# Patient Record
Sex: Male | Born: 1955 | Race: White | Hispanic: No | Smoking: Current every day smoker
Health system: Southern US, Community
[De-identification: ages and names within clinical notes are randomized; demographics above are authoritative.]

## PROBLEM LIST (undated history)

## (undated) DIAGNOSIS — G629 Polyneuropathy, unspecified: Secondary | ICD-10-CM

## (undated) DIAGNOSIS — Z720 Tobacco use: Secondary | ICD-10-CM

## (undated) DIAGNOSIS — N183 Chronic kidney disease, stage 3 unspecified: Secondary | ICD-10-CM

## (undated) DIAGNOSIS — I2510 Atherosclerotic heart disease of native coronary artery without angina pectoris: Secondary | ICD-10-CM

## (undated) DIAGNOSIS — I255 Ischemic cardiomyopathy: Secondary | ICD-10-CM

## (undated) DIAGNOSIS — J449 Chronic obstructive pulmonary disease, unspecified: Secondary | ICD-10-CM

## (undated) DIAGNOSIS — M359 Systemic involvement of connective tissue, unspecified: Secondary | ICD-10-CM

## (undated) DIAGNOSIS — I447 Left bundle-branch block, unspecified: Secondary | ICD-10-CM

## (undated) DIAGNOSIS — D751 Secondary polycythemia: Secondary | ICD-10-CM

## (undated) DIAGNOSIS — I5022 Chronic systolic (congestive) heart failure: Secondary | ICD-10-CM

## (undated) DIAGNOSIS — I998 Other disorder of circulatory system: Secondary | ICD-10-CM

## (undated) DIAGNOSIS — E119 Type 2 diabetes mellitus without complications: Secondary | ICD-10-CM

## (undated) DIAGNOSIS — I10 Essential (primary) hypertension: Secondary | ICD-10-CM

## (undated) DIAGNOSIS — E785 Hyperlipidemia, unspecified: Secondary | ICD-10-CM

## (undated) HISTORY — DX: Secondary polycythemia: D75.1

## (undated) HISTORY — DX: Chronic kidney disease, stage 3 unspecified: N18.30

## (undated) HISTORY — DX: Chronic kidney disease, stage 3 (moderate): N18.3

## (undated) HISTORY — DX: Chronic systolic (congestive) heart failure: I50.22

## (undated) HISTORY — DX: Atherosclerotic heart disease of native coronary artery without angina pectoris: I25.10

## (undated) HISTORY — DX: Ischemic cardiomyopathy: I25.5

## (undated) HISTORY — DX: Chronic obstructive pulmonary disease, unspecified: J44.9

## (undated) HISTORY — DX: Polyneuropathy, unspecified: G62.9

## (undated) HISTORY — DX: Tobacco use: Z72.0

## (undated) HISTORY — DX: Left bundle-branch block, unspecified: I44.7

## (undated) HISTORY — DX: Other disorder of circulatory system: I99.8

## (undated) HISTORY — DX: Hyperlipidemia, unspecified: E78.5

## (undated) HISTORY — DX: Essential (primary) hypertension: I10

---

## 2004-07-27 ENCOUNTER — Other Ambulatory Visit: Payer: Self-pay

## 2004-07-27 ENCOUNTER — Inpatient Hospital Stay: Payer: Self-pay | Admitting: Anesthesiology

## 2006-05-26 ENCOUNTER — Other Ambulatory Visit: Payer: Self-pay

## 2006-05-26 ENCOUNTER — Inpatient Hospital Stay: Payer: Self-pay | Admitting: Internal Medicine

## 2006-05-28 ENCOUNTER — Other Ambulatory Visit: Payer: Self-pay

## 2006-08-05 HISTORY — PX: ORIF TIBIA FRACTURE: SHX5416

## 2007-03-10 ENCOUNTER — Emergency Department: Payer: Self-pay | Admitting: Emergency Medicine

## 2007-03-13 ENCOUNTER — Other Ambulatory Visit: Payer: Self-pay

## 2007-03-13 ENCOUNTER — Inpatient Hospital Stay: Payer: Self-pay | Admitting: Unknown Physician Specialty

## 2008-12-26 ENCOUNTER — Ambulatory Visit: Payer: Self-pay | Admitting: Family Medicine

## 2010-12-04 ENCOUNTER — Emergency Department: Payer: Self-pay | Admitting: Emergency Medicine

## 2011-04-06 ENCOUNTER — Ambulatory Visit: Payer: Self-pay | Admitting: Internal Medicine

## 2011-04-23 ENCOUNTER — Ambulatory Visit: Payer: Self-pay | Admitting: Internal Medicine

## 2011-05-06 ENCOUNTER — Ambulatory Visit: Payer: Self-pay | Admitting: Internal Medicine

## 2011-06-06 ENCOUNTER — Ambulatory Visit: Payer: Self-pay | Admitting: Internal Medicine

## 2011-07-06 ENCOUNTER — Ambulatory Visit: Payer: Self-pay | Admitting: Internal Medicine

## 2011-08-13 ENCOUNTER — Ambulatory Visit: Payer: Self-pay | Admitting: Internal Medicine

## 2011-08-13 LAB — CBC CANCER CENTER
Basophil %: 2.1 %
Eosinophil %: 5.9 %
HCT: 47.3 % (ref 40.0–52.0)
HGB: 15.8 g/dL (ref 13.0–18.0)
Lymphocyte #: 3.3 x10 3/mm (ref 1.0–3.6)
MCV: 85 fL (ref 80–100)
Monocyte #: 0.9 x10 3/mm — ABNORMAL HIGH (ref 0.0–0.7)
Monocyte %: 8.1 %
Neutrophil #: 5.6 x10 3/mm (ref 1.4–6.5)
Platelet: 207 x10 3/mm (ref 150–440)
RBC: 5.57 10*6/uL (ref 4.40–5.90)
WBC: 10.6 x10 3/mm (ref 3.8–10.6)

## 2011-09-06 ENCOUNTER — Ambulatory Visit: Payer: Self-pay | Admitting: Internal Medicine

## 2011-10-04 ENCOUNTER — Ambulatory Visit: Payer: Self-pay | Admitting: Internal Medicine

## 2011-10-06 ENCOUNTER — Emergency Department: Payer: Self-pay | Admitting: Emergency Medicine

## 2011-11-05 ENCOUNTER — Ambulatory Visit: Payer: Self-pay | Admitting: Internal Medicine

## 2011-11-19 ENCOUNTER — Ambulatory Visit: Payer: Self-pay | Admitting: Pain Medicine

## 2011-11-27 ENCOUNTER — Ambulatory Visit: Payer: Self-pay | Admitting: Pain Medicine

## 2011-12-04 ENCOUNTER — Ambulatory Visit: Payer: Self-pay | Admitting: Internal Medicine

## 2012-01-04 ENCOUNTER — Ambulatory Visit: Payer: Self-pay | Admitting: Internal Medicine

## 2012-01-09 ENCOUNTER — Ambulatory Visit: Payer: Self-pay | Admitting: Pain Medicine

## 2012-01-22 ENCOUNTER — Ambulatory Visit: Payer: Self-pay | Admitting: Pain Medicine

## 2012-02-05 ENCOUNTER — Ambulatory Visit: Payer: Self-pay | Admitting: Internal Medicine

## 2012-02-05 LAB — CBC CANCER CENTER
Basophil #: 0.1 x10 3/mm (ref 0.0–0.1)
Eosinophil #: 0.3 x10 3/mm (ref 0.0–0.7)
Eosinophil %: 2.9 %
Lymphocyte %: 26.1 %
MCV: 83 fL (ref 80–100)
Monocyte %: 6.6 %
Neutrophil #: 7.6 x10 3/mm — ABNORMAL HIGH (ref 1.4–6.5)
Neutrophil %: 63.3 %
Platelet: 235 x10 3/mm (ref 150–440)
RBC: 6.08 10*6/uL — ABNORMAL HIGH (ref 4.40–5.90)
RDW: 15.1 % — ABNORMAL HIGH (ref 11.5–14.5)
WBC: 12 x10 3/mm — ABNORMAL HIGH (ref 3.8–10.6)

## 2012-02-27 ENCOUNTER — Ambulatory Visit: Payer: Self-pay | Admitting: Pain Medicine

## 2012-03-05 ENCOUNTER — Ambulatory Visit: Payer: Self-pay | Admitting: Internal Medicine

## 2012-03-09 ENCOUNTER — Ambulatory Visit: Payer: Self-pay | Admitting: Pain Medicine

## 2012-03-26 ENCOUNTER — Ambulatory Visit: Payer: Self-pay | Admitting: Pain Medicine

## 2012-04-01 ENCOUNTER — Ambulatory Visit: Payer: Self-pay | Admitting: Internal Medicine

## 2012-04-01 LAB — CANCER CENTER HEMATOCRIT: HCT: 47.9 % (ref 40.0–52.0)

## 2012-04-07 ENCOUNTER — Ambulatory Visit: Payer: Self-pay | Admitting: Pain Medicine

## 2012-04-08 ENCOUNTER — Ambulatory Visit: Payer: Self-pay | Admitting: Pain Medicine

## 2012-04-09 ENCOUNTER — Ambulatory Visit: Payer: Self-pay | Admitting: Internal Medicine

## 2012-04-23 ENCOUNTER — Ambulatory Visit: Payer: Self-pay | Admitting: Pain Medicine

## 2012-05-11 ENCOUNTER — Ambulatory Visit: Payer: Self-pay | Admitting: Pain Medicine

## 2012-06-10 ENCOUNTER — Ambulatory Visit: Payer: Self-pay | Admitting: Internal Medicine

## 2012-06-16 ENCOUNTER — Ambulatory Visit: Payer: Self-pay | Admitting: Pain Medicine

## 2012-06-24 ENCOUNTER — Ambulatory Visit: Payer: Self-pay | Admitting: Pain Medicine

## 2012-07-05 ENCOUNTER — Ambulatory Visit: Payer: Self-pay | Admitting: Internal Medicine

## 2012-07-16 ENCOUNTER — Ambulatory Visit: Payer: Self-pay | Admitting: Pain Medicine

## 2012-07-22 LAB — CBC CANCER CENTER
Basophil %: 1.1 %
Eosinophil #: 0.5 x10 3/mm (ref 0.0–0.7)
Eosinophil %: 5.6 %
HGB: 15.6 g/dL (ref 13.0–18.0)
Lymphocyte %: 35.5 %
MCHC: 33.6 g/dL (ref 32.0–36.0)
Monocyte %: 8.5 %
Neutrophil %: 49.3 %
Platelet: 177 x10 3/mm (ref 150–440)
RBC: 5.65 10*6/uL (ref 4.40–5.90)
WBC: 9.7 x10 3/mm (ref 3.8–10.6)

## 2012-07-27 ENCOUNTER — Ambulatory Visit: Payer: Self-pay | Admitting: Pain Medicine

## 2012-08-05 ENCOUNTER — Ambulatory Visit: Payer: Self-pay | Admitting: Internal Medicine

## 2012-08-18 ENCOUNTER — Ambulatory Visit: Payer: Self-pay | Admitting: Pain Medicine

## 2012-09-07 ENCOUNTER — Ambulatory Visit: Payer: Self-pay | Admitting: Pain Medicine

## 2012-10-03 ENCOUNTER — Ambulatory Visit: Payer: Self-pay | Admitting: Internal Medicine

## 2012-10-15 ENCOUNTER — Ambulatory Visit: Payer: Self-pay | Admitting: Pain Medicine

## 2012-11-03 ENCOUNTER — Ambulatory Visit: Payer: Self-pay | Admitting: Internal Medicine

## 2012-11-06 LAB — CANCER CENTER HEMATOCRIT: HCT: 49.2 % (ref 40.0–52.0)

## 2012-11-12 ENCOUNTER — Ambulatory Visit: Payer: Self-pay | Admitting: Pain Medicine

## 2012-11-23 ENCOUNTER — Ambulatory Visit: Payer: Self-pay | Admitting: Pain Medicine

## 2012-12-03 ENCOUNTER — Ambulatory Visit: Payer: Self-pay | Admitting: Internal Medicine

## 2012-12-17 ENCOUNTER — Ambulatory Visit: Payer: Self-pay | Admitting: Pain Medicine

## 2012-12-30 ENCOUNTER — Ambulatory Visit: Payer: Self-pay | Admitting: Pain Medicine

## 2013-01-03 ENCOUNTER — Ambulatory Visit: Payer: Self-pay | Admitting: Internal Medicine

## 2013-01-14 ENCOUNTER — Ambulatory Visit: Payer: Self-pay | Admitting: Pain Medicine

## 2013-02-02 ENCOUNTER — Ambulatory Visit: Payer: Self-pay | Admitting: Internal Medicine

## 2013-02-15 ENCOUNTER — Ambulatory Visit: Payer: Self-pay | Admitting: Pain Medicine

## 2013-03-01 ENCOUNTER — Ambulatory Visit: Payer: Self-pay | Admitting: Pain Medicine

## 2013-03-16 ENCOUNTER — Ambulatory Visit: Payer: Self-pay | Admitting: Pain Medicine

## 2013-03-30 ENCOUNTER — Ambulatory Visit: Payer: Self-pay | Admitting: Internal Medicine

## 2013-03-31 LAB — CANCER CENTER HEMATOCRIT: HCT: 48.5 % (ref 40.0–52.0)

## 2013-04-05 ENCOUNTER — Ambulatory Visit: Payer: Self-pay | Admitting: Internal Medicine

## 2013-04-15 ENCOUNTER — Ambulatory Visit: Payer: Self-pay | Admitting: Pain Medicine

## 2013-05-11 ENCOUNTER — Ambulatory Visit: Payer: Self-pay | Admitting: Pain Medicine

## 2013-05-31 ENCOUNTER — Ambulatory Visit: Payer: Self-pay | Admitting: Pain Medicine

## 2013-06-15 ENCOUNTER — Ambulatory Visit: Payer: Self-pay | Admitting: Pain Medicine

## 2013-06-23 ENCOUNTER — Ambulatory Visit: Payer: Self-pay | Admitting: Internal Medicine

## 2013-06-23 LAB — CBC CANCER CENTER
Basophil #: 0.1 x10 3/mm (ref 0.0–0.1)
Basophil %: 1.2 %
Eosinophil %: 5.1 %
Lymphocyte #: 2.3 x10 3/mm (ref 1.0–3.6)
Lymphocyte %: 27.8 %
MCHC: 32.2 g/dL (ref 32.0–36.0)
MCV: 85 fL (ref 80–100)
Monocyte #: 0.5 x10 3/mm (ref 0.2–1.0)
Monocyte %: 5.7 %
Neutrophil #: 5 x10 3/mm (ref 1.4–6.5)
Neutrophil %: 60.2 %
Platelet: 171 x10 3/mm (ref 150–440)

## 2013-06-28 ENCOUNTER — Ambulatory Visit: Payer: Self-pay | Admitting: Pain Medicine

## 2013-07-05 ENCOUNTER — Ambulatory Visit: Payer: Self-pay | Admitting: Internal Medicine

## 2013-07-15 ENCOUNTER — Ambulatory Visit: Payer: Self-pay | Admitting: Pain Medicine

## 2013-08-12 ENCOUNTER — Ambulatory Visit: Payer: Self-pay | Admitting: Pain Medicine

## 2013-08-26 DIAGNOSIS — F1190 Opioid use, unspecified, uncomplicated: Secondary | ICD-10-CM

## 2013-08-26 DIAGNOSIS — G8929 Other chronic pain: Secondary | ICD-10-CM

## 2013-08-26 DIAGNOSIS — F1110 Opioid abuse, uncomplicated: Secondary | ICD-10-CM

## 2013-08-26 DIAGNOSIS — E1140 Type 2 diabetes mellitus with diabetic neuropathy, unspecified: Secondary | ICD-10-CM | POA: Insufficient documentation

## 2013-08-26 HISTORY — DX: Type 2 diabetes mellitus with diabetic neuropathy, unspecified: E11.40

## 2013-08-26 HISTORY — DX: Other chronic pain: G89.29

## 2013-08-26 HISTORY — DX: Opioid use, unspecified, uncomplicated: F11.90

## 2013-08-26 HISTORY — DX: Opioid abuse, uncomplicated: F11.10

## 2013-08-30 ENCOUNTER — Ambulatory Visit: Payer: Self-pay | Admitting: Pain Medicine

## 2013-09-04 ENCOUNTER — Emergency Department: Payer: Self-pay | Admitting: Emergency Medicine

## 2013-09-04 LAB — URINALYSIS, COMPLETE
Bacteria: NONE SEEN
Bilirubin,UR: NEGATIVE
Blood: NEGATIVE
Glucose,UR: 150 mg/dL (ref 0–75)
KETONE: NEGATIVE
Leukocyte Esterase: NEGATIVE
NITRITE: NEGATIVE
PH: 5 (ref 4.5–8.0)
Protein: NEGATIVE
Specific Gravity: 1.008 (ref 1.003–1.030)

## 2013-09-13 ENCOUNTER — Ambulatory Visit: Payer: Self-pay | Admitting: Pain Medicine

## 2013-09-29 ENCOUNTER — Ambulatory Visit: Payer: Self-pay | Admitting: Pain Medicine

## 2013-10-12 ENCOUNTER — Ambulatory Visit: Payer: Self-pay | Admitting: Pain Medicine

## 2013-10-19 LAB — CBC
HCT: 50.9 % (ref 40.0–52.0)
HGB: 16.4 g/dL (ref 13.0–18.0)
MCH: 26.8 pg (ref 26.0–34.0)
MCHC: 32.3 g/dL (ref 32.0–36.0)
MCV: 83 fL (ref 80–100)
Platelet: 166 10*3/uL (ref 150–440)
RBC: 6.13 10*6/uL — ABNORMAL HIGH (ref 4.40–5.90)
RDW: 15.5 % — AB (ref 11.5–14.5)
WBC: 10.7 10*3/uL — ABNORMAL HIGH (ref 3.8–10.6)

## 2013-10-19 LAB — COMPREHENSIVE METABOLIC PANEL
ALBUMIN: 3.5 g/dL (ref 3.4–5.0)
AST: 20 U/L (ref 15–37)
Alkaline Phosphatase: 108 U/L
Anion Gap: 4 — ABNORMAL LOW (ref 7–16)
BILIRUBIN TOTAL: 0.3 mg/dL (ref 0.2–1.0)
BUN: 13 mg/dL (ref 7–18)
CALCIUM: 8.9 mg/dL (ref 8.5–10.1)
CHLORIDE: 104 mmol/L (ref 98–107)
CO2: 29 mmol/L (ref 21–32)
Creatinine: 1.5 mg/dL — ABNORMAL HIGH (ref 0.60–1.30)
EGFR (African American): 59 — ABNORMAL LOW
GFR CALC NON AF AMER: 51 — AB
GLUCOSE: 116 mg/dL — AB (ref 65–99)
Osmolality: 275 (ref 275–301)
POTASSIUM: 4.2 mmol/L (ref 3.5–5.1)
SGPT (ALT): 24 U/L (ref 12–78)
Sodium: 137 mmol/L (ref 136–145)
TOTAL PROTEIN: 7.8 g/dL (ref 6.4–8.2)

## 2013-10-20 ENCOUNTER — Inpatient Hospital Stay: Payer: Self-pay | Admitting: Internal Medicine

## 2013-10-20 ENCOUNTER — Ambulatory Visit: Payer: Self-pay | Admitting: Internal Medicine

## 2013-10-20 LAB — APTT
ACTIVATED PTT: 34.3 s (ref 23.6–35.9)
ACTIVATED PTT: 85.4 s — AB (ref 23.6–35.9)

## 2013-10-21 LAB — APTT
ACTIVATED PTT: 77.2 s — AB (ref 23.6–35.9)
Activated PTT: 128.5 secs — ABNORMAL HIGH (ref 23.6–35.9)
Activated PTT: 131.3 secs — ABNORMAL HIGH (ref 23.6–35.9)

## 2013-10-21 LAB — CBC WITH DIFFERENTIAL/PLATELET
Basophil #: 0.1 10*3/uL (ref 0.0–0.1)
Basophil %: 0.8 %
EOS PCT: 4.6 %
Eosinophil #: 0.5 10*3/uL (ref 0.0–0.7)
HCT: 50.6 % (ref 40.0–52.0)
HGB: 15.9 g/dL (ref 13.0–18.0)
LYMPHS ABS: 3.2 10*3/uL (ref 1.0–3.6)
Lymphocyte %: 32.4 %
MCH: 26.4 pg (ref 26.0–34.0)
MCHC: 31.5 g/dL — AB (ref 32.0–36.0)
MCV: 84 fL (ref 80–100)
MONOS PCT: 5.6 %
Monocyte #: 0.6 x10 3/mm (ref 0.2–1.0)
NEUTROS ABS: 5.6 10*3/uL (ref 1.4–6.5)
Neutrophil %: 56.6 %
Platelet: 160 10*3/uL (ref 150–440)
RBC: 6.02 10*6/uL — AB (ref 4.40–5.90)
RDW: 15.4 % — ABNORMAL HIGH (ref 11.5–14.5)
WBC: 9.8 10*3/uL (ref 3.8–10.6)

## 2013-10-21 LAB — BASIC METABOLIC PANEL
Anion Gap: 2 — ABNORMAL LOW (ref 7–16)
BUN: 16 mg/dL (ref 7–18)
CHLORIDE: 100 mmol/L (ref 98–107)
Calcium, Total: 9.1 mg/dL (ref 8.5–10.1)
Co2: 34 mmol/L — ABNORMAL HIGH (ref 21–32)
Creatinine: 1.42 mg/dL — ABNORMAL HIGH (ref 0.60–1.30)
EGFR (African American): >60
EGFR (Non-African Amer.): 55 — ABNORMAL LOW
GLUCOSE: 103 mg/dL — AB (ref 65–99)
Osmolality: 273 (ref 275–301)
POTASSIUM: 4.2 mmol/L (ref 3.5–5.1)
SODIUM: 136 mmol/L (ref 136–145)

## 2013-10-22 LAB — APTT: ACTIVATED PTT: 71.1 s — AB (ref 23.6–35.9)

## 2013-10-25 LAB — PROT IMMUNOELECTROPHORES(ARMC)

## 2013-10-26 ENCOUNTER — Ambulatory Visit: Payer: Self-pay | Admitting: Internal Medicine

## 2013-10-26 LAB — CBC CANCER CENTER
Basophil #: 0.1 x10 3/mm (ref 0.0–0.1)
Basophil %: 1 %
Eosinophil #: 0.4 x10 3/mm (ref 0.0–0.7)
Eosinophil %: 4.5 %
HCT: 46.8 % (ref 40.0–52.0)
HGB: 14.7 g/dL (ref 13.0–18.0)
Lymphocyte #: 2.9 x10 3/mm (ref 1.0–3.6)
Lymphocyte %: 29.5 %
MCH: 26.3 pg (ref 26.0–34.0)
MCHC: 31.5 g/dL — ABNORMAL LOW (ref 32.0–36.0)
MCV: 84 fL (ref 80–100)
Monocyte #: 1 x10 3/mm (ref 0.2–1.0)
Monocyte %: 9.9 %
Neutrophil #: 5.4 x10 3/mm (ref 1.4–6.5)
Neutrophil %: 55.1 %
Platelet: 187 x10 3/mm (ref 150–440)
RBC: 5.6 10*6/uL (ref 4.40–5.90)
RDW: 15.3 % — ABNORMAL HIGH (ref 11.5–14.5)
WBC: 9.8 x10 3/mm (ref 3.8–10.6)

## 2013-11-02 LAB — CANCER CENTER HEMATOCRIT: HCT: 46.7 % (ref 40.0–52.0)

## 2013-11-03 ENCOUNTER — Ambulatory Visit: Payer: Self-pay | Admitting: Internal Medicine

## 2013-11-10 LAB — CANCER CENTER HEMATOCRIT: HCT: 44 % (ref 40.0–52.0)

## 2013-11-11 ENCOUNTER — Ambulatory Visit: Payer: Self-pay | Admitting: Pain Medicine

## 2013-11-16 ENCOUNTER — Encounter (INDEPENDENT_AMBULATORY_CARE_PROVIDER_SITE_OTHER): Payer: Self-pay

## 2013-11-16 ENCOUNTER — Ambulatory Visit (INDEPENDENT_AMBULATORY_CARE_PROVIDER_SITE_OTHER): Payer: Medicare PPO | Admitting: Cardiovascular Disease

## 2013-11-16 ENCOUNTER — Encounter: Payer: Self-pay | Admitting: Cardiovascular Disease

## 2013-11-16 VITALS — BP 100/80 | HR 75 | Ht 72.0 in | Wt 305.5 lb

## 2013-11-16 DIAGNOSIS — F17200 Nicotine dependence, unspecified, uncomplicated: Secondary | ICD-10-CM

## 2013-11-16 DIAGNOSIS — I428 Other cardiomyopathies: Secondary | ICD-10-CM

## 2013-11-16 DIAGNOSIS — I998 Other disorder of circulatory system: Secondary | ICD-10-CM

## 2013-11-16 DIAGNOSIS — E119 Type 2 diabetes mellitus without complications: Secondary | ICD-10-CM

## 2013-11-16 DIAGNOSIS — R0602 Shortness of breath: Secondary | ICD-10-CM

## 2013-11-16 DIAGNOSIS — I999 Unspecified disorder of circulatory system: Secondary | ICD-10-CM

## 2013-11-16 DIAGNOSIS — I447 Left bundle-branch block, unspecified: Secondary | ICD-10-CM

## 2013-11-16 DIAGNOSIS — E7849 Other hyperlipidemia: Secondary | ICD-10-CM | POA: Insufficient documentation

## 2013-11-16 DIAGNOSIS — I420 Dilated cardiomyopathy: Secondary | ICD-10-CM | POA: Insufficient documentation

## 2013-11-16 DIAGNOSIS — I423 Endomyocardial (eosinophilic) disease: Secondary | ICD-10-CM | POA: Insufficient documentation

## 2013-11-16 DIAGNOSIS — R079 Chest pain, unspecified: Secondary | ICD-10-CM

## 2013-11-16 DIAGNOSIS — E785 Hyperlipidemia, unspecified: Secondary | ICD-10-CM

## 2013-11-16 HISTORY — DX: Shortness of breath: R06.02

## 2013-11-16 HISTORY — DX: Chest pain, unspecified: R07.9

## 2013-11-16 HISTORY — DX: Other hyperlipidemia: E78.49

## 2013-11-16 HISTORY — DX: Morbid (severe) obesity due to excess calories: E66.01

## 2013-11-16 HISTORY — DX: Endomyocardial (eosinophilic) disease: I42.3

## 2013-11-16 HISTORY — DX: Left bundle-branch block, unspecified: I44.7

## 2013-11-16 HISTORY — DX: Dilated cardiomyopathy: I42.0

## 2013-11-16 HISTORY — DX: Type 2 diabetes mellitus without complications: E11.9

## 2013-11-16 HISTORY — DX: Unspecified disorder of circulatory system: I99.9

## 2013-11-16 HISTORY — DX: Nicotine dependence, unspecified, uncomplicated: F17.200

## 2013-11-16 LAB — CANCER CENTER HEMATOCRIT: HCT: 44.7 % (ref 40.0–52.0)

## 2013-11-16 NOTE — Assessment & Plan Note (Signed)
We have encouraged continued exercise, careful diet management in an effort to lose weight. 

## 2013-11-16 NOTE — Assessment & Plan Note (Signed)
He does report some chest pain with heavy exertion. Stress test has been ordered. He is unable to treadmill, pharmacologic Myoview ordered

## 2013-11-16 NOTE — Assessment & Plan Note (Signed)
No prior EKGs for comparison. Stress test has been ordered. lexiscan given his inability to ambulate from back pain, knee pain and given his left bundle branch block

## 2013-11-16 NOTE — Assessment & Plan Note (Signed)
Recommended that he continue on his lovastatin 

## 2013-11-16 NOTE — Assessment & Plan Note (Signed)
Diabetes numbers appear to have been improving.

## 2013-11-16 NOTE — Assessment & Plan Note (Signed)
Ejection fraction 35-40% on recent echocardiogram. He is high risk of ischemia and coronary artery disease. Stress test has been ordered. We'll try to avoid cardiac catheterization. Notes from the hospital indicate borderline elevated creatinine 1.5

## 2013-11-16 NOTE — Assessment & Plan Note (Addendum)
He continues to smoke at least 4 cigarettes per day, and his peak was smoking 2 packs per day We have encouraged him to continue to work on weaning his cigarettes and smoking cessation. He will continue to work on this and does not want any assistance with chantix.

## 2013-11-16 NOTE — Patient Instructions (Signed)
[facility] MYOVIEW  Your caregiver has ordered a Stress Test with nuclear imaging. The purpose of this test is to evaluate the blood supply to your heart muscle. This procedure is referred to as a "Non-Invasive Stress Test." This is because other than having an IV started in your vein, nothing is inserted or "invades" your body. Cardiac stress tests are done to find areas of poor blood flow to the heart by determining the extent of coronary artery disease (CAD). Some patients exercise on a treadmill, which naturally increases the blood flow to your heart, while others who are  unable to walk on a treadmill due to physical limitations have a pharmacologic/chemical stress agent called Lexiscan . This medicine will mimic walking on a treadmill by temporarily increasing your coronary blood flow.   Please note: these test may take anywhere between 2-4 hours to complete  PLEASE REPORT TO [facility] MEDICAL MALL ENTRANCE  THE VOLUNTEERS AT THE FIRST DESK WILL DIRECT YOU WHERE TO GO  Date of Procedure:_______Thurs, April 16 AND Fri, April 17_____________  Arrival Time for Procedure:_______7:15am_______________________  Instructions regarding medication:    __X__:  Hold betablocker(s) night before procedure and morning of procedure: METOPROLOL  PLEASE NOTIFY THE OFFICE AT LEAST 24 HOURS IN ADVANCE IF YOU ARE UNABLE TO KEEP YOUR APPOINTMENT.  [phone] AND  PLEASE NOTIFY NUCLEAR MEDICINE AT [facility] AT LEAST 24 HOURS IN ADVANCE IF YOU ARE UNABLE TO KEEP YOUR APPOINTMENT. [phone]  How to prepare for your Myoview test:  1. Do not eat or drink after midnight 2. No caffeine for 24 hours prior to test 3. No smoking 24 hours prior to test. 4. Your medication may be taken with water.  If your doctor stopped a medication because of this test, do not take that medication. 5. Ladies, please do not wear dresses.  Skirts or pants are appropriate. Please wear a short sleeve shirt. 6. No perfume, cologne or  lotion. 7. Wear comfortable walking shoes. No heels!

## 2013-11-16 NOTE — Assessment & Plan Note (Signed)
Possibly secondary to embolic phenomenon versus erythrocytosis. He is currently taking aspirin and Plavix

## 2013-11-16 NOTE — Progress Notes (Signed)
Patient ID: [name], male    DOB: June 18, 1956, 58 y.o.   MRN: [id number]  HPI Comments: [name] is a 58 year old gentleman with history of smoking since the age of 40 who continues to smoke, diabetes type 2, hyperlipidemia, COPD, secondary erythrocytosis, managed by outpatient hematology with periodic phlebotomy with recent admission to the hospital 10/21/2011 with discharge March 20 for ischemic toe. He presents for new patient evaluation in the office for cardiomyopathy, ejection fraction 35-40%.  Echocardiogram was done in the hospital to rule out embolic source. It was felt he had small plaque with emboli causing his ischemic toe. He was treated with anticoagulation in the hospital with improvement of his symptoms. Unable to exclude erythrocytosis as a cause of his symptoms and he did have phlebotomy of one unit while in the hospital.  Echocardiogram read by outside physician detailed ejection fraction 35-40% mild MR and TR No other cardiac workup is done in the hospital  As an outpatient he reports that he feels well. He has shortness of breath with exertion, some chest pain with heavy exertion. He's not very active at baseline and does not do a regular  exercise program. No significant lower extremity edema. Denies having any PND orthopnea. He does report having a stress test in 2006 for symptoms of malaise, diaphoresis. He does not remember the results. No prior cardiac catheterization  EKG shows normal sinus rhythm with rate 75 beats per minute with left bundle branch block   Outpatient Encounter Prescriptions as of 11/16/2013  Medication Sig  . aspirin 81 MG tablet Take 81 mg by mouth daily.  Marland Kitchen b complex vitamins tablet Take 1 tablet by mouth daily.  . clopidogrel (PLAVIX) 75 MG tablet Take 75 mg by mouth daily with breakfast.  . ergocalciferol (VITAMIN D2) 50000 UNITS capsule Take 50,000 Units by mouth once a week.  . gabapentin (NEURONTIN) 300 MG capsule Take 300 mg by  mouth 4 (four) times daily.  Marland Kitchen lisinopril (PRINIVIL,ZESTRIL) 20 MG tablet Take 10 mg by mouth daily.  Marland Kitchen lovastatin (MEVACOR) 40 MG tablet Take 40 mg by mouth at bedtime.  . metoprolol tartrate (LOPRESSOR) 25 MG tablet Take 25 mg by mouth 2 (two) times daily.  Marland Kitchen oxyCODONE (OXY IR/ROXICODONE) 5 MG immediate release tablet Take 5 mg by mouth every 4 (four) hours as needed for severe pain.   Review of Systems  Constitutional: Negative.   HENT: Negative.   Eyes: Negative.   Respiratory: Negative.   Cardiovascular: Negative.   Gastrointestinal: Negative.   Endocrine: Negative.   Musculoskeletal: Negative.   Skin: Negative.   Allergic/Immunologic: Negative.   Neurological: Negative.   Hematological: Negative.   Psychiatric/Behavioral: Negative.   All other systems reviewed and are negative.   BP 100/80  Pulse 75  Ht 6' (1.829 m)  Wt 305 lb 8 oz (138.574 kg)  BMI 41.42 kg/m2  Physical Exam  Nursing note and vitals reviewed. Constitutional: He is oriented to person, place, and time. He appears well-developed and well-nourished.  obese  HENT:  Head: Normocephalic.  Nose: Nose normal.  Mouth/Throat: Oropharynx is clear and moist.  Eyes: Conjunctivae are normal. Pupils are equal, round, and reactive to light.  Neck: Normal range of motion. Neck supple. No JVD present.  Cardiovascular: Normal rate, regular rhythm, S1 normal, S2 normal, normal heart sounds and intact distal pulses.  Exam reveals no gallop and no friction rub.   No murmur heard. Pulmonary/Chest: Effort normal and breath sounds normal. No respiratory distress. He  has no wheezes. He has no rales. He exhibits no tenderness.  Abdominal: Soft. Bowel sounds are normal. He exhibits no distension. There is no tenderness.  Musculoskeletal: Normal range of motion. He exhibits no edema and no tenderness.  Lymphadenopathy:    He has no cervical adenopathy.  Neurological: He is alert and oriented to person, place, and time.  Coordination normal.  Skin: Skin is warm and dry. No rash noted. No erythema.  Psychiatric: He has a normal mood and affect. His behavior is normal. Judgment and thought content normal.      Assessment and Plan

## 2013-11-18 ENCOUNTER — Ambulatory Visit: Payer: Self-pay | Admitting: Cardiovascular Disease

## 2013-11-18 DIAGNOSIS — R9431 Abnormal electrocardiogram [ECG] [EKG]: Secondary | ICD-10-CM

## 2013-11-19 ENCOUNTER — Other Ambulatory Visit: Payer: Self-pay

## 2013-11-19 DIAGNOSIS — R0602 Shortness of breath: Secondary | ICD-10-CM

## 2013-11-23 LAB — CANCER CENTER HEMATOCRIT: HCT: 46 % (ref 40.0–52.0)

## 2013-11-30 LAB — CBC CANCER CENTER
BASOS PCT: 0.9 %
Basophil #: 0.1 x10 3/mm (ref 0.0–0.1)
EOS ABS: 0.4 x10 3/mm (ref 0.0–0.7)
EOS PCT: 5.4 %
HCT: 42.5 % (ref 40.0–52.0)
HGB: 13.8 g/dL (ref 13.0–18.0)
Lymphocyte #: 2.1 x10 3/mm (ref 1.0–3.6)
Lymphocyte %: 25.9 %
MCH: 26 pg (ref 26.0–34.0)
MCHC: 32.4 g/dL (ref 32.0–36.0)
MCV: 80 fL (ref 80–100)
Monocyte #: 0.5 x10 3/mm (ref 0.2–1.0)
Monocyte %: 6.1 %
Neutrophil #: 5 x10 3/mm (ref 1.4–6.5)
Neutrophil %: 61.7 %
Platelet: 210 x10 3/mm (ref 150–440)
RBC: 5.3 10*6/uL (ref 4.40–5.90)
RDW: 15.1 % — ABNORMAL HIGH (ref 11.5–14.5)
WBC: 8.1 x10 3/mm (ref 3.8–10.6)

## 2013-12-03 ENCOUNTER — Ambulatory Visit: Payer: Self-pay | Admitting: Internal Medicine

## 2013-12-09 ENCOUNTER — Ambulatory Visit: Payer: Self-pay | Admitting: Pain Medicine

## 2013-12-14 LAB — CANCER CENTER HEMATOCRIT: HCT: 46 % (ref 40.0–52.0)

## 2013-12-22 ENCOUNTER — Ambulatory Visit: Payer: Self-pay | Admitting: Pain Medicine

## 2013-12-28 ENCOUNTER — Encounter: Payer: Self-pay | Admitting: Nurse Practitioner

## 2013-12-28 ENCOUNTER — Ambulatory Visit (INDEPENDENT_AMBULATORY_CARE_PROVIDER_SITE_OTHER): Payer: Medicare PPO | Admitting: Nurse Practitioner

## 2013-12-28 ENCOUNTER — Telehealth: Payer: Self-pay

## 2013-12-28 ENCOUNTER — Ambulatory Visit: Payer: Self-pay | Admitting: Nurse Practitioner

## 2013-12-28 VITALS — BP 98/80 | HR 63 | Ht 72.0 in | Wt 318.5 lb

## 2013-12-28 DIAGNOSIS — R079 Chest pain, unspecified: Secondary | ICD-10-CM

## 2013-12-28 DIAGNOSIS — I10 Essential (primary) hypertension: Secondary | ICD-10-CM

## 2013-12-28 DIAGNOSIS — I428 Other cardiomyopathies: Secondary | ICD-10-CM

## 2013-12-28 DIAGNOSIS — I200 Unstable angina: Secondary | ICD-10-CM

## 2013-12-28 DIAGNOSIS — N183 Chronic kidney disease, stage 3 unspecified: Secondary | ICD-10-CM

## 2013-12-28 DIAGNOSIS — E785 Hyperlipidemia, unspecified: Secondary | ICD-10-CM

## 2013-12-28 DIAGNOSIS — I420 Dilated cardiomyopathy: Secondary | ICD-10-CM

## 2013-12-28 DIAGNOSIS — Z01812 Encounter for preprocedural laboratory examination: Secondary | ICD-10-CM

## 2013-12-28 LAB — CANCER CENTER HEMATOCRIT: HCT: 41 % (ref 40.0–52.0)

## 2013-12-28 MED ORDER — ISOSORBIDE MONONITRATE ER 30 MG PO TB24: 30.0000 mg | ORAL_TABLET | Freq: Every day | ORAL | Status: DC

## 2013-12-28 MED ORDER — NITROGLYCERIN 0.4 MG SL SUBL: 0.4000 mg | SUBLINGUAL_TABLET | SUBLINGUAL | Status: DC | PRN

## 2013-12-28 MED ORDER — ISOSORBIDE MONONITRATE ER 30 MG PO TB24: 15.0000 mg | ORAL_TABLET | Freq: Every day | ORAL | Status: DC

## 2013-12-28 NOTE — Progress Notes (Signed)
Patient Name: [name] Date of Encounter: 12/28/2013  Primary Care Provider:  Nicholaus Corolla, MD Primary Cardiologist:  Concha Se, MD   Patient Profile  58 y/o male with a recent h/o cardiomyopathy and mildly abnl stress test who presents to clinic 2/2 progressive exertional angina.  Problem List   Past Medical History  Diagnosis Date  . COPD (chronic obstructive pulmonary disease)   . Hyperlipidemia   . Hypertension   . Borderline DM     a. Prev on meds- taken off.  . Peripheral neuropathy   . Tobacco abuse     a. 46 yrs, up to 2 ppd, changed to e-cigarette 05/2013.  . Cardiomyopathy     a. 10/2013 Echo: EF 35-40%, mild MR/TR;  b. 11/2013 Lexi MV: EF 46%, fixed apical defect with small distal antsept ischemia->overall low-risk->med Rx.  . Secondary erythrocytosis     a. followed by heme-onc with periodic phlebotomy.  . CKD (chronic kidney disease), stage III     a. Creat 1.42 (10/2013).  . Ischemic toe     a. 10/2013 L fifth toe - felt to be either 2/2 embolic plaque vs erythrocytosis - seen by vascular surgery (D[facility]) ->conservative rx.  Marland Kitchen LBBB (left bundle branch block)    History reviewed. No pertinent past surgical history.  Allergies  No Known Allergies  HPI  58 y/o male who was recently found to have a cardiomyopathy with an EF of 35-40% by echo in April.  At that time, he presented with an ischemic left fifth toe and echo was performed to r/o a cardioembolic source.  No source was fond however EF was noted to be down.  It was felt that cause of ischemia was either 2/2 embolus from a proximal vessel of 2/2 erythrocytosis, which is chronic.  He f/u with Dr. Mariah Milling and given his h/o depressed EF and LBBB, a myoview was performed revealing a fixed apical defect with distal anteroseptal ischemia.  This was felt to be a low risk study overall and in the setting of mild renal insufficieny, medical therapy was recommended unless he were to have progression of  Ss.  Unfortunately, he has been experiencing exertional sscp assoc with dyspnea occurring with minimal activity.  He has had this to some degree for the better part of the past 10 years but previously Ss only occurred with high levels of exertion.  Now, c/p and dyspnea are occurring ~ 1-2 x/wk with minimal activity.  Ss typically last about 5-10 mins and resolve with rest.  Due to progression of Ss, he made this appt today.    He has not been weighing himself @ home regularly, though he is pretty sure his wt has been climbing as his activity has been reduced r/t c/p and dyspnea and he's also been eating 6 scoops of banana ice cream every night before bed.  His wt is up 13 lbs since his last visit in April.  He has some degree of chronic left lower ext edema in the setting of prior fx and surgery.  He denies palpitations, pnd, orthopnea, n, v, dizziness, syncope, or early satiety.   Home Medications  Prior to Admission medications   Medication Sig Start Date End Date Taking? Authorizing Provider  aspirin 81 MG tablet Take 81 mg by mouth daily.   Yes Historical Provider, MD  b complex vitamins tablet Take 1 tablet by mouth daily.   Yes Historical Provider, MD  clopidogrel (PLAVIX) 75 MG tablet Take 75 mg by  mouth daily with breakfast.   Yes Historical Provider, MD  ergocalciferol (VITAMIN D2) 50000 UNITS capsule Take 50,000 Units by mouth once a week.   Yes Historical Provider, MD  gabapentin (NEURONTIN) 300 MG capsule Take 300 mg by mouth 4 (four) times daily.   Yes Historical Provider, MD  lisinopril (PRINIVIL,ZESTRIL) 20 MG tablet Take 10 mg by mouth daily.   Yes Historical Provider, MD  lovastatin (MEVACOR) 40 MG tablet Take 40 mg by mouth at bedtime.   Yes Historical Provider, MD  metoprolol tartrate (LOPRESSOR) 25 MG tablet Take 25 mg by mouth 2 (two) times daily.   Yes Historical Provider, MD  oxyCODONE (OXY IR/ROXICODONE) 5 MG immediate release tablet Take 5 mg by mouth every 4 (four) hours  as needed for severe pain.   Yes Historical Provider, MD   Family History  Family History  Problem Relation Age of Onset  . Hypertension Mother     alive @ [phone]  . Hyperlipidemia Mother   . Hypertension Brother   . Hyperlipidemia Brother   . Hypertension Maternal Aunt   . Hypertension Brother   . Hyperlipidemia Brother   . Hypertension Brother   . Hyperlipidemia Brother   . Diabetes Brother    Social History  History   Social History  . Marital Status: Divorced    Spouse Name: N/A    Number of Children: N/A  . Years of Education: N/A   Occupational History  . Not on file.   Social History Main Topics  . Smoking status: Current Every Day Smoker -- 2.00 packs/day for 46 years    Types: Cigarettes, E-cigarettes  . Smokeless tobacco: Not on file     Comment: Currently smoking E cigarettes.  . Alcohol Use: No     Comment: alcohol abuse in the past.   . Drug Use: No  . Sexual Activity: Not on file   Other Topics Concern  . Not on file   Social History Narrative   He lives in [place] with his dtr and son-in-law.  On disability.  Does not routinely exercise.  Eats 6 scoops of banana ice cream every night.    Review of Systems General:  No chills, fever, night sweats or weight changes.  Cardiovascular: +++ exertional chest pain and dyspnea on exertion as outlined above.  Chronic, mild left LE edema.  No orthopnea, palpitations, paroxysmal nocturnal dyspnea. Dermatological: No rash, lesions/masses Respiratory: No cough, +++ dyspnea Urologic: No hematuria, dysuria Abdominal:   No nausea, vomiting, diarrhea, bright red blood per rectum, melena, or hematemesis Neurologic:  No visual changes, wkns, changes in mental status. All other systems reviewed and are otherwise negative except as noted above.  Physical Exam  Blood pressure 98/80, pulse 63, height 6' (1.829 m), weight 318 lb 8 oz (144.471 kg).  General: Pleasant, NAD Psych: Normal affect. Neuro: Alert and  oriented X 3. Moves all extremities spontaneously. HEENT: Normal  Neck: Supple without bruits.  Obese, difficult to assess JVD. Lungs:  Resp regular and unlabored, CTA. Heart: RRR, distant, no s3, s4, or murmurs. Abdomen: Soft, protuberant, non-tender, non-distended, BS + x 4.  Extremities: No clubbing, cyanosis.  Trace bilat LE edema. DP/PT/Radials 1+ and equal bilaterally.  Accessory Clinical Findings  ECG - RSR, 63, LBBB.  Assessment & Plan  1.  [place]:  Pt presents with progressive exertional angina and DOE.  He has had this to some extent for the better part of 10 yrs but now Ss are occurring with minimal activity  and resolve with rest.  As above, he does have a LBBB and LV dysfxn with a mildly abnl MV in April with anteroseptal ischemia.  Initially we tried to avoid diagnostic cath as he does have mild renal insufficiency.  With progressive Ss however, he will require cath.  I have discussed his case with Dr. Mariah Milling, who is in agreement.  We will arrange for diagnostic cath later this week.  Cont asa, plavix, bb, statin.  Hold lisinopril in preparation for contrast and also to allow for BP room to add imdur 15 mg daily.  Will also provide a Rx for sl NTG prn.  The patient understands that risks include but are not limited to stroke (1 in 1000), death (1 in 1000), kidney failure [usually temporary] (1 in 500), bleeding (1 in 200), allergic reaction [possibly serious] (1 in 200), and agrees to proceed.   2.  Presumed ICM:  His wt is up however his volume appears to be stable.  Difficult to tell on exam 2/2 his size.  He does not have significant lower ext edema.  We can evaluate his LVEDP during cath to better understand his filling pressures.  Cont BB (consider switching to toprol xl in the future given LV dysfxn).  Holding ACEI in preparation for cath/contrast.  3.  HTN:  Stable.  BP's actually trend low.  4.  HL: Cont statin.  5.  Tob Abuse/Nicotine addiction:  Quit smoking cigarettes in  October 2014.  Continued cessation encouraged.  He is not smoking an e-cigarette -> cessation advised.  6.  Morbid Obesity:  Admits to overeating and binging on ice cream every night.  Following cath - dependent upon results - he would benefit from aggressive nutrition counseling and likely cardiac rehab.  7.  Ischemic L 5th Toe:  Stable.  On asa/plavix.  8.  CKD III:  F/u bmet today.  Hold acei in preparation for cath.  Hydrate AM of cath.  9,  Dispo:  Cath later this week.  F/U in 2 wks.   Ok Anis, NP 12/28/2013, 3:36 PM

## 2013-12-28 NOTE — Telephone Encounter (Signed)
Pt called and states he had an episode yesterday of sob and couldn't breathe, also had chest pain.

## 2013-12-28 NOTE — Patient Instructions (Addendum)
[facility] Cardiac Cath Instructions   You are scheduled for a Cardiac Cath on:________5/28/15_________________  Please arrive at _0830 AM_____am on the day of your procedure  You will need to pre-register prior to the day of your procedure.  Enter through the [employer] at [facility].  Registration is the first desk on your right.  Please take the procedure order we have given you in order to be registered appropriately  Do not eat/drink anything after midnight  Someone will need to drive you home  It is recommended someone be with you for the first 24 hours after your procedure  Wear clothes that are easy to get on/off and wear slip on shoes if possible   Medications bring a current list of all medications with you  _x__ You may take all of your medications the morning of your procedure with enough water to swallow safely  _x__ Do not take these medications before your procedure: Lisinopril   Day of your procedure: Arrive at the Medical Mall entrance.  Free valet service is available.  After entering the Medical Mall please check-in at the registration desk (1st desk on your right) to receive your armband. After receiving your armband someone will escort you to the cardiac cath/special procedures waiting area.  The usual length of stay after your procedure is about 2 to 3 hours.  This can vary.  If you have any questions, please call our office at [phone], or you may call the cardiac cath lab at [facility] directly at [phone]  Your physician has recommended you make the following change in your medication:  Hold Lisinopril until after your cath  Start Imdur 30 mg tab. Take 1/2 a tab (15 mg) daily Start nitroglycerin 0.4 mg tablets. Place one under tongue every 5 minutes x 3 for chest pain   Your physician recommends that you have pre cath labs today: BMP CBC INR   Please take order to [facility] for chest x ray today   Your physician recommends that you schedule a follow-up  appointment in:  With Ward Givens in 2 weeks

## 2013-12-28 NOTE — Telephone Encounter (Signed)
Spoke w/ pt.  He reports an episode of CP and SOB yesterday that lasted approximately 10 mins. Pain was relieved after 30 mins of rest.   Pt does not have nitro. Would like to be seen today.  Pt sched to see Ward Givens, NP today at 2:45.

## 2013-12-29 ENCOUNTER — Telehealth: Payer: Self-pay | Admitting: *Deleted

## 2013-12-29 LAB — CBC WITH DIFFERENTIAL
BASOS: 1 %
Basophils Absolute: 0.1 10*3/uL (ref 0.0–0.2)
EOS: 4 %
Eosinophils Absolute: 0.4 10*3/uL (ref 0.0–0.4)
HCT: 40.6 % (ref 37.5–51.0)
HEMOGLOBIN: 13.3 g/dL (ref 12.6–17.7)
IMMATURE GRANS (ABS): 0 10*3/uL (ref 0.0–0.1)
Immature Granulocytes: 0 %
LYMPHS: 38 %
Lymphocytes Absolute: 3 10*3/uL (ref 0.7–3.1)
MCH: 25.7 pg — AB (ref 26.6–33.0)
MCHC: 32.8 g/dL (ref 31.5–35.7)
MCV: 79 fL (ref 79–97)
MONOS ABS: 0.7 10*3/uL (ref 0.1–0.9)
Monocytes: 9 %
Neutrophils Absolute: 3.9 10*3/uL (ref 1.4–7.0)
Neutrophils Relative %: 48 %
Platelets: 246 10*3/uL (ref 150–379)
RBC: 5.17 x10E6/uL (ref 4.14–5.80)
RDW: 15.2 % (ref 12.3–15.4)
WBC: 8 10*3/uL (ref 3.4–10.8)

## 2013-12-29 LAB — BASIC METABOLIC PANEL
BUN/Creatinine Ratio: 7 — ABNORMAL LOW (ref 9–20)
BUN: 10 mg/dL (ref 6–24)
CALCIUM: 9 mg/dL (ref 8.7–10.2)
CO2: 22 mmol/L (ref 18–29)
CREATININE: 1.49 mg/dL — AB (ref 0.76–1.27)
Chloride: 101 mmol/L (ref 97–108)
GFR, EST AFRICAN AMERICAN: 60 mL/min/{1.73_m2} (ref >59)
GFR, EST NON AFRICAN AMERICAN: 52 mL/min/{1.73_m2} — AB (ref >59)
GLUCOSE: 85 mg/dL (ref 65–99)
POTASSIUM: 4.6 mmol/L (ref 3.5–5.2)
Sodium: 137 mmol/L (ref 134–144)

## 2013-12-29 LAB — PROTIME-INR
INR: 1.1 (ref 0.8–1.2)
PROTHROMBIN TIME: 10.9 s (ref 9.1–12.0)

## 2013-12-29 NOTE — Telephone Encounter (Signed)
Cath orders faxed to [facility]  Receipt confirmed by Robin in cath lab

## 2013-12-30 ENCOUNTER — Encounter: Payer: Self-pay | Admitting: Cardiovascular Disease

## 2013-12-30 ENCOUNTER — Ambulatory Visit: Payer: Self-pay | Admitting: Cardiovascular Disease

## 2013-12-30 DIAGNOSIS — I2510 Atherosclerotic heart disease of native coronary artery without angina pectoris: Secondary | ICD-10-CM

## 2014-01-03 ENCOUNTER — Ambulatory Visit: Payer: Self-pay | Admitting: Internal Medicine

## 2014-01-07 ENCOUNTER — Ambulatory Visit (INDEPENDENT_AMBULATORY_CARE_PROVIDER_SITE_OTHER): Payer: Medicare PPO | Admitting: Cardiovascular Disease

## 2014-01-07 ENCOUNTER — Encounter: Payer: Self-pay | Admitting: Cardiovascular Disease

## 2014-01-07 VITALS — BP 110/80 | HR 70 | Ht 72.0 in | Wt 318.2 lb

## 2014-01-07 DIAGNOSIS — R0602 Shortness of breath: Secondary | ICD-10-CM

## 2014-01-07 DIAGNOSIS — F17200 Nicotine dependence, unspecified, uncomplicated: Secondary | ICD-10-CM

## 2014-01-07 DIAGNOSIS — I428 Other cardiomyopathies: Secondary | ICD-10-CM

## 2014-01-07 DIAGNOSIS — E785 Hyperlipidemia, unspecified: Secondary | ICD-10-CM

## 2014-01-07 DIAGNOSIS — I2510 Atherosclerotic heart disease of native coronary artery without angina pectoris: Secondary | ICD-10-CM

## 2014-01-07 DIAGNOSIS — R079 Chest pain, unspecified: Secondary | ICD-10-CM

## 2014-01-07 DIAGNOSIS — I420 Dilated cardiomyopathy: Secondary | ICD-10-CM

## 2014-01-07 DIAGNOSIS — E119 Type 2 diabetes mellitus without complications: Secondary | ICD-10-CM

## 2014-01-07 DIAGNOSIS — I447 Left bundle-branch block, unspecified: Secondary | ICD-10-CM

## 2014-01-07 HISTORY — DX: Atherosclerotic heart disease of native coronary artery without angina pectoris: I25.10

## 2014-01-07 NOTE — Assessment & Plan Note (Signed)
We have encouraged him to continue to work on weaning his cigarettes and smoking cessation. He will continue to work on this and does not want any assistance with chantix.  

## 2014-01-07 NOTE — Patient Instructions (Signed)
You are doing well. No medication changes were made.  Please call us if you have new issues that need to be addressed before your next appt.  Your physician wants you to follow-up in: 6 months.  You will receive a reminder letter in the mail two months in advance. If you don't receive a letter, please call our office to schedule the follow-up appointment.   

## 2014-01-07 NOTE — Assessment & Plan Note (Signed)
Recommended he continue his beta blocker, ACE inhibitor, nitrates. Blood pressure borderline low, asymptomatic

## 2014-01-07 NOTE — Assessment & Plan Note (Signed)
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 No intervention at this time

## 2014-01-07 NOTE — Assessment & Plan Note (Signed)
No recent chest pain. Suggested he use sublingual nitroglycerin for chest pain symptoms. Small vessel disease noted on recent catheterization

## 2014-01-07 NOTE — Assessment & Plan Note (Signed)
Chronic shortness of breath likely from obesity, deconditioning. Mild contribution from his underlying systolic dysfunction

## 2014-01-07 NOTE — Assessment & Plan Note (Signed)
Climb in his weight. Neuropathy limiting his ability to ambulate.

## 2014-01-07 NOTE — Assessment & Plan Note (Signed)
Recommended he continue his lovastatin. Goal LDL less than 70

## 2014-01-07 NOTE — Progress Notes (Signed)
Patient ID: [name], male    DOB: 10-04-1955, 58 y.o.   MRN: [id number]  HPI Comments: [name] is a 58 year old gentleman with history of smoking since the age of 85 who continues to smoke, diabetes type 2, hyperlipidemia, COPD, secondary erythrocytosis, managed by outpatient hematology with periodic phlebotomy with recent admission to the hospital 10/21/2011 with discharge March 20 for ischemic toe.  History of cardiomyopathy, ejection fraction 35-40%.  Echocardiogram was done in the hospital to rule out embolic source. It was felt he had small plaque with emboli causing his ischemic toe. He was treated with anticoagulation in the hospital with improvement of his symptoms. Unable to exclude erythrocytosis as a cause of his symptoms and he did have phlebotomy of one unit while in the hospital. Echocardiogram read by outside physician detailed ejection fraction 35-40% mild MR and TR No other cardiac workup  done in the hospital  He was seen in the clinic and for symptoms of shortness of breath, chest pain, with left bundle branch block, depressed ejection fraction, had a cardiac catheterization. This was done last week that showed severe distal LAD disease, severe distal RCA disease. No intervention performed, medical management recommended  In followup today he reports that he has recovered well from his cardiac catheterization. He continues to have shortness of breath with exertion. He is sedentary, has had significant weight gain over the past year secondary to neuropathy in his feet. He is unable to walk, has severe pain, takes oxycodone for his foot pain. Also reports having back pain.  EKG shows normal sinus rhythm with rate 70 beats per minute with left bundle branch block  Outpatient Encounter Prescriptions as of 01/07/2014  Medication Sig  . aspirin 81 MG tablet Take 81 mg by mouth daily.  Marland Kitchen b complex vitamins tablet Take 1 tablet by mouth daily.  . clopidogrel (PLAVIX) 75 MG  tablet Take 75 mg by mouth daily with breakfast.  . ergocalciferol (VITAMIN D2) 50000 UNITS capsule Take 50,000 Units by mouth once a week.  . gabapentin (NEURONTIN) 300 MG capsule Take 1,200 mg by mouth 4 (four) times daily.   . isosorbide mononitrate (IMDUR) 30 MG 24 hr tablet Take 0.5 tablets (15 mg total) by mouth daily.  Marland Kitchen lisinopril (PRINIVIL,ZESTRIL) 20 MG tablet Take 10 mg by mouth daily.  Marland Kitchen lovastatin (MEVACOR) 40 MG tablet Take 40 mg by mouth at bedtime.  . metoprolol tartrate (LOPRESSOR) 25 MG tablet Take 25 mg by mouth 2 (two) times daily.  . nitroGLYCERIN (NITROSTAT) 0.4 MG SL tablet Place 1 tablet (0.4 mg total) under the tongue every 5 (five) minutes as needed for chest pain.  Marland Kitchen oxyCODONE (OXY IR/ROXICODONE) 5 MG immediate release tablet Take 5 mg by mouth every 4 (four) hours as needed for severe pain.  . traZODone (DESYREL) 150 MG tablet Take 150 mg by mouth at bedtime.    Review of Systems  Constitutional: Negative.   HENT: Negative.   Eyes: Negative.   Respiratory: Positive for shortness of breath.   Cardiovascular: Negative.   Gastrointestinal: Negative.   Endocrine: Negative.   Musculoskeletal: Negative.   Skin: Negative.   Allergic/Immunologic: Negative.   Neurological: Negative.        Neuropathy in his feet bilaterally  Hematological: Negative.   Psychiatric/Behavioral: Negative.   All other systems reviewed and are negative.   BP 110/80  Pulse 70  Ht 6' (1.829 m)  Wt 318 lb 4 oz (144.357 kg)  BMI 43.15 kg/m2  Physical  Exam  Nursing note and vitals reviewed. Constitutional: He is oriented to person, place, and time. He appears well-developed and well-nourished.  obese  HENT:  Head: Normocephalic.  Nose: Nose normal.  Mouth/Throat: Oropharynx is clear and moist.  Eyes: Conjunctivae are normal. Pupils are equal, round, and reactive to light.  Neck: Normal range of motion. Neck supple. No JVD present.  Cardiovascular: Normal rate, regular rhythm, S1  normal, S2 normal, normal heart sounds and intact distal pulses.  Exam reveals no gallop and no friction rub.   No murmur heard. Pulmonary/Chest: Effort normal and breath sounds normal. No respiratory distress. He has no wheezes. He has no rales. He exhibits no tenderness.  Abdominal: Soft. Bowel sounds are normal. He exhibits no distension. There is no tenderness.  Musculoskeletal: Normal range of motion. He exhibits no edema and no tenderness.  Lymphadenopathy:    He has no cervical adenopathy.  Neurological: He is alert and oriented to person, place, and time. Coordination normal.  Skin: Skin is warm and dry. No rash noted. No erythema.  Psychiatric: He has a normal mood and affect. His behavior is normal. Judgment and thought content normal.      Assessment and Plan

## 2014-01-07 NOTE — Assessment & Plan Note (Signed)
We have encouraged continued exercise, careful diet management in an effort to lose weight. 

## 2014-01-11 LAB — CANCER CENTER HEMATOCRIT: HCT: 41.6 % (ref 40.0–52.0)

## 2014-01-18 ENCOUNTER — Ambulatory Visit: Payer: Self-pay | Admitting: Pain Medicine

## 2014-01-20 ENCOUNTER — Ambulatory Visit: Payer: Medicare PPO | Admitting: Cardiovascular Disease

## 2014-01-25 LAB — CANCER CENTER HEMATOCRIT: HCT: 42 % (ref 40.0–52.0)

## 2014-02-02 ENCOUNTER — Ambulatory Visit: Payer: Self-pay | Admitting: Internal Medicine

## 2014-02-22 LAB — CANCER CENTER HEMATOCRIT: HCT: 41 % (ref 40.0–52.0)

## 2014-03-05 ENCOUNTER — Ambulatory Visit: Payer: Self-pay | Admitting: Internal Medicine

## 2014-03-22 LAB — CBC CANCER CENTER
Basophil #: 0.1 x10 3/mm (ref 0.0–0.1)
Basophil %: 1 %
EOS ABS: 0.4 x10 3/mm (ref 0.0–0.7)
EOS PCT: 4.4 %
HCT: 43.5 % (ref 40.0–52.0)
HGB: 13.5 g/dL (ref 13.0–18.0)
LYMPHS PCT: 36.9 %
Lymphocyte #: 3.1 x10 3/mm (ref 1.0–3.6)
MCH: 23.1 pg — AB (ref 26.0–34.0)
MCHC: 31.1 g/dL — ABNORMAL LOW (ref 32.0–36.0)
MCV: 74 fL — AB (ref 80–100)
MONOS PCT: 11.1 %
Monocyte #: 0.9 x10 3/mm (ref 0.2–1.0)
NEUTROS PCT: 46.6 %
Neutrophil #: 3.9 x10 3/mm (ref 1.4–6.5)
PLATELETS: 171 x10 3/mm (ref 150–440)
RBC: 5.87 10*6/uL (ref 4.40–5.90)
RDW: 16.6 % — ABNORMAL HIGH (ref 11.5–14.5)
WBC: 8.4 x10 3/mm (ref 3.8–10.6)

## 2014-03-29 ENCOUNTER — Ambulatory Visit: Payer: Self-pay | Admitting: Pain Medicine

## 2014-04-05 ENCOUNTER — Ambulatory Visit: Payer: Self-pay | Admitting: Internal Medicine

## 2014-05-02 ENCOUNTER — Ambulatory Visit: Payer: Self-pay | Admitting: Pain Medicine

## 2014-05-24 ENCOUNTER — Ambulatory Visit: Payer: Self-pay | Admitting: Pain Medicine

## 2014-06-14 ENCOUNTER — Ambulatory Visit: Payer: Self-pay | Admitting: Internal Medicine

## 2014-06-14 LAB — CANCER CENTER HEMATOCRIT: HCT: 44.3 % (ref 40.0–52.0)

## 2014-07-05 ENCOUNTER — Ambulatory Visit: Payer: Self-pay | Admitting: Internal Medicine

## 2014-08-31 ENCOUNTER — Telehealth: Payer: Self-pay | Admitting: Cardiovascular Disease

## 2014-08-31 NOTE — Telephone Encounter (Signed)
Patient requesting refill. 

## 2014-08-31 NOTE — Telephone Encounter (Signed)
°  1. Which medications need to be refilled? Metoprolol Tartrate  2. Which pharmacy is medication to be sent to? [facility] in [place]   3. Do they need a 30 day or 90 day supply? 90 day  4. Would they like a call back once the medication has been sent to the pharmacy? Yes

## 2014-09-01 ENCOUNTER — Other Ambulatory Visit: Payer: Self-pay | Admitting: *Deleted

## 2014-09-01 MED ORDER — METOPROLOL TARTRATE 25 MG PO TABS: 25.0000 mg | ORAL_TABLET | Freq: Two times a day (BID) | ORAL | Status: DC

## 2014-09-13 ENCOUNTER — Other Ambulatory Visit: Payer: Self-pay | Admitting: Cardiovascular Disease

## 2014-09-13 ENCOUNTER — Encounter: Payer: Self-pay | Admitting: Cardiovascular Disease

## 2014-09-13 ENCOUNTER — Ambulatory Visit (INDEPENDENT_AMBULATORY_CARE_PROVIDER_SITE_OTHER): Payer: Medicare PPO | Admitting: Cardiovascular Disease

## 2014-09-13 VITALS — BP 110/64 | HR 66 | Ht 72.0 in | Wt 332.2 lb

## 2014-09-13 DIAGNOSIS — R0602 Shortness of breath: Secondary | ICD-10-CM

## 2014-09-13 DIAGNOSIS — E1159 Type 2 diabetes mellitus with other circulatory complications: Secondary | ICD-10-CM

## 2014-09-13 DIAGNOSIS — E785 Hyperlipidemia, unspecified: Secondary | ICD-10-CM

## 2014-09-13 DIAGNOSIS — I2510 Atherosclerotic heart disease of native coronary artery without angina pectoris: Secondary | ICD-10-CM

## 2014-09-13 MED ORDER — TRAZODONE HCL 150 MG PO TABS: 150.0000 mg | ORAL_TABLET | Freq: Every day | ORAL | Status: DC

## 2014-09-13 NOTE — Telephone Encounter (Signed)
Pharmacist notified.

## 2014-09-13 NOTE — Assessment & Plan Note (Signed)
Chronic mild baseline shortness of breath likely from obesity, deconditioning, underlying COPD

## 2014-09-13 NOTE — Patient Instructions (Addendum)
You are doing well. No medication changes were made.  Please call us if you have new issues that need to be addressed before your next appt.  Your physician wants you to follow-up in: 12 months.  You will receive a reminder letter in the mail two months in advance. If you don't receive a letter, please call our office to schedule the follow-up appointment. 

## 2014-09-13 NOTE — Assessment & Plan Note (Signed)
He is working with primary care for improved diabetes control. Still not well controlled. Recommended he watch his carbohydrates, smaller meal portions

## 2014-09-13 NOTE — Assessment & Plan Note (Signed)
We have encouraged continued exercise, careful diet management in an effort to lose weight. 

## 2014-09-13 NOTE — Assessment & Plan Note (Signed)
Encouraged him to stay on his lovastatin. Goal LDL less than 70 

## 2014-09-13 NOTE — Progress Notes (Signed)
Patient ID: [name], male    DOB: [birth year], 59 y.o.   MRN: [id number]  HPI Comments: [name] is a 59 year old gentleman with history of smoking since the age of 24 who continues to smoke, diabetes type 2, hyperlipidemia, COPD, secondary erythrocytosis, managed by outpatient hematology with periodic phlebotomy with recent admission to the hospital 10/21/2011 with discharge March 20 for ischemic toe.  History of cardiomyopathy, ejection fraction 35-40%. Catheterization in 2015 previously showed distal LAD and RCA disease, medical management recommended He presents today for follow-up of his coronary artery disease.  In follow-up today, he reports that he is doing well. He stop smoking more than one year ago, uses e-cigarette. For the past 6 visits to the cancer Center, he has not needed phlebotomy since he stop smoking. He has neuropathy but this is much better on Lyrica Hemoglobin A1c 8.4 No regular exercise program. Weight continues to be a problem He does report having occasional shortness of breath Blood work November 2015 showing normal LFTs, creatinine 1.53 EKG on today's visit shows normal sinus rhythm with rate 66 bpm, left bundle branch block  Other past medical history  Echocardiogram was done in the hospital to rule out embolic source. It was felt he had small plaque with emboli causing his ischemic toe. He was treated with anticoagulation in the hospital with improvement of his symptoms. Unable to exclude erythrocytosis as a cause of his symptoms and he did have phlebotomy of one unit while in the hospital. Echocardiogram read by outside physician detailed ejection fraction 35-40% mild MR and TR No other cardiac workup  done in the hospital  He was seen in the clinic and for syhowedmptoms of shortness of breath, chest pain, with left bundle branch block, depressed ejection fraction, had a cardiac catheterization. severe distal LAD disease, severe distal RCA disease. No  intervention performed, medical management recommended  No Known Allergies  Outpatient Encounter Prescriptions as of 09/13/2014  Medication Sig  . aspirin 81 MG tablet Take 81 mg by mouth daily.  . clopidogrel (PLAVIX) 75 MG tablet Take 75 mg by mouth daily with breakfast.  . isosorbide mononitrate (IMDUR) 30 MG 24 hr tablet Take 0.5 tablets (15 mg total) by mouth daily.  Marland Kitchen lisinopril (PRINIVIL,ZESTRIL) 20 MG tablet Take 10 mg by mouth daily.  Marland Kitchen lovastatin (MEVACOR) 40 MG tablet Take 40 mg by mouth at bedtime.  . metoprolol tartrate (LOPRESSOR) 25 MG tablet Take 1 tablet (25 mg total) by mouth 2 (two) times daily.  . nitroGLYCERIN (NITROSTAT) 0.4 MG SL tablet Place 1 tablet (0.4 mg total) under the tongue every 5 (five) minutes as needed for chest pain.  Marland Kitchen oxyCODONE (OXY IR/ROXICODONE) 5 MG immediate release tablet Take 5 mg by mouth every 4 (four) hours as needed for severe pain.  . traZODone (DESYREL) 150 MG tablet Take 150 mg by mouth at bedtime.  . [DISCONTINUED] b complex vitamins tablet Take 1 tablet by mouth daily.  . [DISCONTINUED] ergocalciferol (VITAMIN D2) 50000 UNITS capsule Take 50,000 Units by mouth once a week.  . [DISCONTINUED] gabapentin (NEURONTIN) 300 MG capsule Take 1,200 mg by mouth 4 (four) times daily.   . [DISCONTINUED] pregabalin (LYRICA) 50 MG capsule Take 50 mg by mouth 3 (three) times daily.    Past Medical History  Diagnosis Date  . COPD (chronic obstructive pulmonary disease)   . Hyperlipidemia   . Hypertension   . Borderline DM     a. Prev on meds- taken off.  Marland Kitchen  Peripheral neuropathy   . Tobacco abuse     a. 46 yrs, up to 2 ppd, changed to e-cigarette 05/2013.  . Cardiomyopathy     a. 10/2013 Echo: EF 35-40%, mild MR/TR;  b. 11/2013 Lexi MV: EF 46%, fixed apical defect with small distal antsept ischemia->overall low-risk->med Rx.  . Secondary erythrocytosis     a. followed by heme-onc with periodic phlebotomy.  . CKD (chronic kidney disease), stage III      a. Creat 1.42 (10/2013).  . Ischemic toe     a. 10/2013 L fifth toe - felt to be either 2/2 embolic plaque vs erythrocytosis - seen by vascular surgery ([facility]) ->conservative rx.  Marland Kitchen LBBB (left bundle branch block)     History reviewed. No pertinent past surgical history.  Social History  reports that he has been smoking Cigarettes and E-cigarettes.  He has a 92 pack-year smoking history. He does not have any smokeless tobacco history on file. He reports that he does not drink alcohol or use illicit drugs.  Family History family history includes Diabetes in his brother; Hyperlipidemia in his brother, brother, brother, and mother; Hypertension in his brother, brother, brother, maternal aunt, and mother.  Review of Systems  Constitutional: Negative.   Respiratory: Positive for shortness of breath.   Cardiovascular: Negative.   Gastrointestinal: Negative.   Musculoskeletal: Negative.   Skin: Negative.   Neurological: Negative.        Neuropathy in his feet bilaterally  Hematological: Negative.   Psychiatric/Behavioral: Negative.   All other systems reviewed and are negative.   BP 110/64 mmHg  Pulse 66  Ht 6' (1.829 m)  Wt 332 lb 4 oz (150.708 kg)  BMI 45.05 kg/m2  Physical Exam  Constitutional: He is oriented to person, place, and time. He appears well-developed and well-nourished.  obese  HENT:  Head: Normocephalic.  Nose: Nose normal.  Mouth/Throat: Oropharynx is clear and moist.  Eyes: Conjunctivae are normal. Pupils are equal, round, and reactive to light.  Neck: Normal range of motion. Neck supple. No JVD present.  Cardiovascular: Normal rate, regular rhythm, S1 normal, S2 normal, normal heart sounds and intact distal pulses.  Exam reveals no gallop and no friction rub.   No murmur heard. Pulmonary/Chest: Effort normal and breath sounds normal. No respiratory distress. He has no wheezes. He has no rales. He exhibits no tenderness.  Abdominal: Soft. Bowel sounds are  normal. He exhibits no distension. There is no tenderness.  Musculoskeletal: Normal range of motion. He exhibits no edema or tenderness.  Lymphadenopathy:    He has no cervical adenopathy.  Neurological: He is alert and oriented to person, place, and time. Coordination normal.  Skin: Skin is warm and dry. No rash noted. No erythema.  Psychiatric: He has a normal mood and affect. His behavior is normal. Judgment and thought content normal.      Assessment and Plan   Nursing note and vitals reviewed.

## 2014-09-13 NOTE — Assessment & Plan Note (Signed)
Currently with no symptoms of angina. No further workup at this time. Continue current medication regimen. 

## 2014-10-14 ENCOUNTER — Other Ambulatory Visit: Payer: Self-pay | Admitting: Cardiovascular Disease

## 2014-11-26 NOTE — H&P (Signed)
PATIENT NAME:  [name], [name] MR#:  [id number] DATE OF BIRTH:  01/25/1956  DATE OF ADMISSION:  10/20/2013  REFERRING PHYSICIAN:  Dr. Manson Passey.   PRIMARY CARE PHYSICIAN:  Dr. Bobbye Riggs.      ONCOLOGIST:  Dr. Sherrlyn Hock.   CHIEF COMPLAINT:  Toe pain.  HISTORY OF PRESENT ILLNESS:  A 59 year old Caucasian gentleman with past medical history of hypertension, hyperlipidemia, diabetes and COPD presenting with toe pain.  He describes acute onset of left lower extremity fifth digit pain, atraumatic.  He  experiences while walking and described the pain as 6 out of 10 in intensity, sharp and burning in quality, nonradiating.  No relieving factors.  Worsened by walking.  The pain persisted.  He eventually took his shoe off approximately three hours after onset of symptoms, at that time noted to have toe discoloration blue and black.  At that time presented to [facility] for further work-up and evaluation.  Of note, he has previously followed with Dr. Sherrlyn Hock for erythrocytosis and receives regular phlebotomy saying the last time was a few months ago, receives phlebotomy whenever hematocrit level is greater than 50.  Currently complaining only of toe pain.   REVIEW OF SYSTEMS:  CONSTITUTIONAL:  Denies fever, fatigue, weakness.  EYES:  Denies blurry vision, double vision, eye pain.  EARS, NOSE, THROAT:  Denies tinnitus, ear pain, hearing loss.  RESPIRATORY:  Denies cough, wheeze, shortness of breath.  CARDIOVASCULAR:  Denies chest pain, palpitations, edema.  GASTROINTESTINAL:  Denies nausea, vomiting, diarrhea, abdominal pain.  GENITOURINARY:  Denies dysuria, hematuria.  ENDOCRINE:  Denies nocturia or thyroid problems.  HEMATOLOGIC AND LYMPHATIC:  Denies easy bruising, bleeding.  SKIN:  Denies rashes or lesions aside from toe discoloration as mentioned above.  MUSCULOSKELETAL:  Positive for pain in the left lower extremity fifth digit.  Otherwise, denies any neck, back, shoulder, knees, hips, any arthritic  symptoms.  NEUROLOGIC:  Positive for paresthesias of the lower extremities which are chronic as well as numbness of the left lower extremity which is chronic.  Denies any weakness.  PSYCHIATRIC:  Denies anxiety or depressive symptoms.  Otherwise, full review of systems performed by me is negative.   PAST MEDICAL HISTORY:  Hyperlipidemia, diabetes, COPD, hypertension, history of alcohol abuse as well as erythrocytosis requiring phlebotomy.   SOCIAL HISTORY:  Positive for tobacco usage as well as alcohol usage.  Denies any drug usage.   FAMILY HISTORY:  Positive for hypertension.   ALLERGIES:  No known drug allergies.   HOME MEDICATIONS:  Aspirin 81 mg by mouth daily, lisinopril 20 mg 1/2 tablet by mouth daily, gabapentin 300 mg by mouth daily, nortriptyline 25 mg by mouth at bedtime, metformin 1000 mg by mouth twice daily, lovastatin 20 mg by mouth daily, metoprolol 25 mg extended release by mouth daily, oxycodone 5 mg by mouth twice daily.   PHYSICAL EXAMINATION: VITAL SIGNS:  Temperature 97.9, heart rate 105, respirations 20, blood pressure 143/96, saturating 95% on room air.  Weight 136.1 kg, BMI 40.7.   GENERAL:  Well-nourished, well-developed Caucasian gentleman currently in no acute distress. HEAD:  Normocephalic, atraumatic.  EYES:  Pupils equal, round, reactive to light.  Extraocular muscles intact.  No scleral icterus.  MOUTH:  Moist mucous membranes.  Dentition intact.  No abscess noted.  EAR, NOSE, THROAT:  Throat clear without exudates. No external lesions.  NECK:  Supple.  No thyromegaly.  No nodules.  No JVD.  PULMONARY:  Clear to auscultation bilaterally without wheezes, rhonchi, or rales.  No  use of accessory muscles.  Good respiratory effort.  Chest nontender to palpation. CARDIOVASCULAR:  S1, S2, regular rate and rhythm.  No murmurs, rubs, or gallops.  No edema.  Pedal pulses 2+ bilaterally.  GASTROINTESTINAL:  Soft, nontender, nondistended.  No masses.  Positive bowel  sounds.  No hepatosplenomegaly.  MUSCULOSKELETAL:  No swelling, clubbing or edema.  Range of motion full in all extremities.  DP pulses 2+ bilaterally as well as tibial pulses.  Range of motion full in all extremities.  Fifth digit on the left lower extremity essentially nonexistent capillary refill, toe is cool as well as discolored bluish.  NEUROLOGICAL:  Cranial nerves II through XII intact.  Sensation diminished over bilateral lower extremities.  He states this is chronic.  SKIN:  No ulcerations, lesions, rashes.  Positive for cyanosis of left fifth digit.  Skin warm, dry.  Turgor intact.  PSYCHIATRIC:  Mood and affect within normal limits.  The patient is awake, alert and oriented x 3.  Insight and judgment intact.   LABORATORY DATA:  Sodium 137, potassium 4.2, chloride 104, bicarb 29, BUN 13, creatinine 1.5, glucose 116.  LFTs within normal limits.  WBC 10.7, hemoglobin 16.4, hematocrit 50.9, platelets 166.   ASSESSMENT AND PLAN:  A 59 year old Caucasian gentleman with history of erythrocytosis, diabetes, hypertension, presenting with toe pain  1.  Ischemic toe.  Placed on heparin drip for treatment.  Vascular consult.  Pain medication.  Initiate bowel regimen.  Etiology, possible erythrocytosis with thrombosis versus more traditional pathogenesis including atherosclerosis as he is a heavy smoker and has high cholesterol.  This would be a somewhat atypical presentation for [facility] given his age and lack of prior symptoms.  We will also increase his statin therapy to Lipitor 80 mg daily and consult hematology oncology Dr. Sherrlyn Hock who has been following him in the past.  2.  Diabetes type 2, complicated by neuropathy.  Insulin sliding scale with q. 6 hour Accu-Cheks.  Hold by mouth agents.  3.  Hypertension, Toprol-XL, lisinopril.   4.  Venous thromboembolism prophylaxis.  He will be placed on heparin drip.  5.  CODE STATUS:  THE PATIENT IS A FULL CODE.   TIME SPENT:  45 minutes.     ____________________________ Cletis Athens. Elyas Villamor, MD dkh:ea D: 10/20/2013 02:42:31 ET T: 10/20/2013 03:20:46 ET JOB#: [id number]  cc: Cletis Athens. Ram Haugan, MD, <Dictator> Ivalene Platte Synetta Shadow MD ELECTRONICALLY SIGNED 10/28/2013 0:40

## 2014-11-26 NOTE — Consult Note (Signed)
PATIENT NAME:  [name], [name] MR#:  [id number] DATE OF BIRTH:  05-03-1956  DATE OF CONSULTATION:  10/20/2013  REFERRING PHYSICIAN:  Cletis Athens. Hower, MD CONSULTING PHYSICIAN:  Kenitra Leventhal R. Sherrlyn Hock, MD  REASON FOR CONSULTATION: Erythrocytosis and thrombosis.    HISTORY OF PRESENT ILLNESS: The patient is a 59 year old gentleman with past medical history significant for hypertension, diabetes, hyperlipidemia, COPD, secondary erythrocytosis with history of chronic smoking who has been on outpatient hematology management of erythrocytosis and gets phlebotomy if hematocrit is 50 or higher. The patient was last seen at the [facility] on November 19th, at which time hematocrit was below the target number at 49.0. The patient has been admitted to the hospital on March 18th with complaints of pain and black discoloration of the left fifth toe, of acute onset. Hematocrit on admission was 50.9, hemoglobin 16.4, WBC 10,700, platelets 166. The patient has been seen by vascular surgeon, Dr. 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 The patient is on IV heparin anticoagulation. States that pain is significantly better and discoloration is beginning to improve. He denies any other known history of thromboembolic phenomena. Appetite is good. No unintentional weight loss.   PAST MEDICAL HISTORY AND PAST SURGICAL HISTORY: As in HPI above.   SOCIAL HISTORY: Chronic smoker. Occasional alcohol intake. Denies recreational drug usage.   FAMILY HISTORY: Remarkable for hypertension. Denies malignancy.   ALLERGIES: No known drug allergies.   HOME MEDICATIONS:  1. Aspirin 81 mg daily.  2. Gabapentin 300 mg daily.  3. Lisinopril 20 mg 1/2 tablet daily.  4. Lovastatin 20 mg 1 tablet daily.  5. Metformin 1000 mg b.i.d.  6. Metoprolol 25 mg daily.  7. Nortriptyline 25 mg at bedtime.  8.  Oxycodone p.r.n. for pain.  9. Advil p.r.n. for pain.   REVIEW OF SYSTEMS:  CONSTITUTIONAL: Chronic dyspnea on exertion, and fatigue on exertion is unchanged. No fevers, chills or night sweats.  HEENT: Denies any headaches, dizziness, epistaxis, ear or jaw pain. No new sinus symptoms.  CARDIAC: No angina, palpitation, orthopnea or PND.  LUNGS: Has chronic cough and dyspnea on exertion from COPD. No hemoptysis or chest pain.  GASTROINTESTINAL: No nausea, vomiting or diarrhea. No bright red blood in stools or melena.  GENITOURINARY: No dysuria or hematuria.  SKIN: Discoloration of the left fifth toe as described above. No other rashes or pruritus.  HEMATOLOGIC: No obvious bleeding symptoms.  NEUROLOGIC: No new focal weakness, seizures or loss of consciousness.  ENDOCRINE: No polyuria or polydipsia.   PHYSICAL EXAMINATION:  GENERAL: The patient is a moderately-built, well-nourished individual, sitting in bed, alert and oriented and converses appropriately. No icterus. No pallor.  VITAL SIGNS: 97.3, 77, 17, 128/96, 97% on room air.  HEENT: Normocephalic, atraumatic. Extraocular movements intact. Sclerae anicteric.  NECK: Negative for lymphadenopathy.  CARDIOVASCULAR: S1, S2, regular rate and rhythm.  LUNGS: Show bilateral diminished breath sounds overall, occasional rhonchi. No crepitations.  ABDOMEN: Soft, nontender. No hepatosplenomegaly clinically.  EXTREMITIES: Show no major edema or cyanosis. There is dark discoloration of the left fifth toe. Pedal pulses felt.  NEUROLOGIC: Limited exam. Cranial nerves intact. Moves all extremities spontaneously.   LABORATORY RESULTS: Creatinine 1.5, potassium 4.2, glucose 116, calcium 8.9. LFTs unremarkable. WBC 10,700, hematocrit 50.9, hemoglobin 16.4, MCV 83, platelets 166. PTT 85.4.   IMPRESSION AND RECOMMENDATIONS: A 59 year old gentleman with history of chronic  smoking, chronic obstructive pulmonary disease and other medical issues, including  secondary erythrocytosis on intermittent phlebotomy as outpatient when hematocrit rises to 50 or higher, now admitted with left foot blue toe syndrome, felt more likely to be atheroembolic disease. Hematocrit today is in the upper normal range at 50.9. Given that it is above the target range, will pursue phlebotomy, 350 mL today. Unclear if this is contributing to his thromboembolic phenomena given that hematocrit still remains in the normal range. The patient explained that we will try to pursue more strict control of hematocrit and try to maintain at less than 46, and he is agreeable to this. Will also get further workup, including JAK2 [id number] mutation to look for evidence of polycythemia vera, anticardiolipin antibodies and beta-2 glycoprotein 1 antibody, serum homocysteine level. Recommend considering a 2-D echocardiogram to rule out any embolic source as indicated. The patient clinically seems to be improving on anticoagulation, and vascular surgery is also following. From hematology standpoint, will continue to monitor hematocrit closely and pursue phlebotomy as indicated by values. The patient is agreeable to this plan.   Thank you for the referral. Please feel free to contact me if any additional questions.   ____________________________ Maren Reamer Sherrlyn Hock, MD srp:gb D: 10/21/2013 00:15:05 ET T: 10/21/2013 00:25:54 ET JOB#: [id number]  cc: Darryll Capers R. Sherrlyn Hock, MD, <Dictator> Wille Celeste MD ELECTRONICALLY SIGNED 10/24/2013 11:12

## 2014-11-26 NOTE — Consult Note (Signed)
CHIEF COMPLAINT and HISTORY:  Subjective/Chief Complaint painful blue left fifth toe   History of Present Illness Patient admitted earlier this am with painful blue left fifth toe.  Started within past few days.  Worsened and became more painful prompting admission.  Has had assessment for arterial insufficiency as an outpatient previously and LE flow was normal at that time.  He is a heavy smoker.  He has erythrocytosis and sees Dr. Ma Hillock for this.  No right foot symptoms.  No open ulcers.  No signs of systemic infection.   PAST MEDICAL/SURGICAL HISTORY:  Past Medical History:   high cholesterol:    CHF:    Diabetes Mellitus, Type II (NIDD):    ETOH abuse:    COPD:    hypertension:    asthma:    LLE surgery d/t break, pins & plate placed:   ALLERGIES:  Allergies:  No Known Allergies:   HOME MEDICATIONS:  Home Medications: Medication Instructions Status  OXYCODONE  5  MG  LIMIT  1  TAB PO BID - QID IF TOLERATED  Active  gabapentin 300 mg oral capsule  Active  aspirin delayed release capsule 81 mg 1 cap(s) orally once a day  Active  lovastatin tablet 20 mg 1 tab(s) orally once a day with supper Active  metformin 1000 mg oral tablet 1 tab(s) orally 2 times a day Active  advil    by mouth as needed  Active  metoprolol 25 mg oral tablet, extended release 1 tab(s) orally once a day Active  lisinopril 20 mg oral tablet 1/2 tab(s) orally once a day Active  nortriptyline 25 mg oral capsule 1  orally once a day (at bedtime) Active   Family and Social History:  Family History Coronary Artery Disease  Hypertension  Smoking   Social History positive  tobacco (Current within 1 year), positive ETOH   Place of Living Home   Review of Systems:  Subjective/Chief Complaint chronic leg pain left foot/toe pain as above   Fever/Chills No   Cough No   Sputum No   Abdominal Pain No   Diarrhea No   Constipation No   Nausea/Vomiting No   SOB/DOE No   Chest Pain No    Telemetry Reviewed NSR   Dysuria No   Tolerating PT Yes   Tolerating Diet Yes   Medications/Allergies Reviewed Medications/Allergies reviewed   Physical Exam:  GEN well developed, well nourished   HEENT pink conjunctivae, hearing intact to voice   NECK No masses  trachea midline   RESP normal resp effort  no use of accessory muscles   CARD regular rate  no JVD   VASCULAR ACCESS none   ABD denies tenderness  soft   GU no superpubic tenderness   LYMPH negative neck, negative axillae   EXTR left fifth toe is cyanotic, cap refill is present but delayed.  Other toes appera normal.  Right foot normal.  Mild stasis changes bilaterally.  DP pulses 2+ bilaterally.  PT pulses 1+ bilaterally   SKIN cyanosis of left fifth toe as above, otherwise OK   NEURO cranial nerves intact, motor/sensory function intact   PSYCH alert, A+O to time, place, person   LABS:  Laboratory Results: Hepatic:    17-Mar-15 22:58, Comprehensive Metabolic Panel  Bilirubin, Total 0.3  Alkaline Phosphatase 108  45-117  NOTE: New Reference Range  06/25/13  SGPT (ALT) 24  SGOT (AST) 20  Total Protein, Serum 7.8  Albumin, Serum 3.5  Routine Chem:  Glucose, Serum  116  BUN 13  Creatinine (comp) 1.50  Sodium, Serum 137  Potassium, Serum 4.2  Chloride, Serum 104  CO2, Serum 29  Calcium (Total), Serum 8.9  Osmolality (calc) 275  eGFR (African American) 59  eGFR (Non-African American) 51  eGFR values <34m/min/1.73 m2 may be an indication of chronic  kidney disease (CKD).  Calculated eGFR is useful in patients with stable renal function.  The eGFR calculation will not be reliable in acutely ill patients  when serum creatinine is changing rapidly. It is not useful in   patients on dialysis. The eGFR calculation may not be applicable  to patients at the low and high extremes of body sizes, pregnant  women, and vegetarians.  Anion Gap 4  Routine Coag:    17-Mar-15 22:58, Activated PTT   Activated PTT (APTT) 34.3  A HCT value >55% may artifactually increase the APTT. In one study,  the increase was an average of 19%.  Reference: "Effect on Routine and Special Coagulation Testing Values  of Citrate Anticoagulant Adjustment in Patients with High HCT Values."  American Journal of Clinical Pathology 2006;126:400-405.    18-Mar-15 09:52, Activated PTT  Activated PTT (APTT) 85.4  A HCT value >55% may artifactually increase the APTT. In one study,  the increase was an average of 19%.  Reference: "Effect on Routine and Special Coagulation Testing Values  of Citrate Anticoagulant Adjustment in Patients with High HCT Values."  American Journal of Clinical Pathology 2006;126:400-405.  Routine Hem:    17-Mar-15 22:58, Hemogram, Platelet Count  WBC (CBC) 10.7  RBC (CBC) 6.13  Hemoglobin (CBC) 16.4  Hematocrit (CBC) 50.9  Platelet Count (CBC) 166  Result(s) reported on 19 Oct 2013 at 11:44PM.  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 This is usually due to a non-hemodynamically significant plaque or aneruysm. Would recommend duplex follow up as an outpatient if he is clinicaly stable.  would consider angiogram to treat any irregular plaques with stents to avoid further embolization in the future if he does not clinically improve in 2 weeks or so or has recurrent problems in the future.     level 4   Electronic Signatures: DAlgernon Huxley(MD)  (Signed 18-Mar-15 12:59)  Authored: Chief Complaint and History, PAST MEDICAL/SURGICAL HISTORY, ALLERGIES, HOME MEDICATIONS, Family and Social History, Review of Systems, Physical Exam, LABS, Assessment and  Plan   Last Updated: 18-Mar-15 12:59 by DAlgernon Huxley(MD)

## 2014-11-26 NOTE — Discharge Summary (Signed)
PATIENT NAME:  [name], [name] MR#:  [id number] DATE OF BIRTH:  [birth year]  DATE OF ADMISSION:  10/20/2013 DATE OF DISCHARGE:  10/22/2013  ADMITTING DIAGNOSIS: Toe pain.   DISCHARGE DIAGNOSES:  1. Ischemic toe, possible causes including emboli of a small plaque from his vascular tree as well as history of polycythemia.   2. Hypertension.  3. Hyperlipidemia.  4. Diabetes.  5. Chronic obstructive pulmonary disease.  6. Chronic neuropathy.  7. Chronic renal failure, likely stage II.  8. History of alcohol abuse.  9. Nicotine addiction.  10. Newly diagnosed cardiomyopathy noted during this hospitalization with systolic dysfunction, without evidence of acute congestive heart failure. Will need outpatient cardiology followup.   CONSULTANTS DURING HOSPITALIZATION: Dr. Wyn Quaker, Dr. Sherrlyn Hock.   PERTINENT LABS AND EVALUATIONS:  1. WBC 10.7, hemoglobin 16.4, platelet count was 166. Glucose 116, BUN 13, creatinine 1.50, sodium 137, potassium 4.2, chloride 104, CO2 was 29, calcium 8.9. LFTs were normal.  2. Echocardiogram of the heart showed LVEF of 35% to 40%, mildly to moderate decreased global left ventricular systolic function, mild mitral valve regurg, severely increased left ventricular posterior wall thickness, mild tricuspid regurg.   HOSPITAL COURSE: Please refer to H and P done by the admitting physician. The patient is a 59 year old white male with a history of hypertension, hyperlipidemia, diabetes and COPD. Presented with significant toe pain and bluish discoloration of the toe. The patient was thought to have an ischemic toe and was admitted for further evaluation and treatment. He was started on IV heparin drip and was seen in consultation by vascular surgery. Vascular surgery felt that he had good pulses in his lower extremity so did not feel that this was due to an acute occlusion but felt that more of a plaque rupture and emboli from proximal vessel was likely the possibility. He also has  history of erythrocytosis and that was also considered to be the cause. Vascular recommended outpatient followup with possible angiogram and antiplatelet therapy and maximization of his cholesterol control. At this time, his pain is significantly improved. He was also seen in consultation by Dr. Sherrlyn Hock who follows him as an outpatient, and he did have a phlebotomy with 1 unit of packed RBCs removed. The patient at this point is doing much better and is stable for discharge. He also had an echocardiogram to rule out cause for emboli. There was an incidental note made of a decreased ejection fraction of the heart. The patient currently has no evidence of CHF, and he is not having any chest pains. He will need outpatient followup with cardiology for ischemic workup and followup on his cardiomyopathy. He is already on metoprolol and lisinopril which would be good treatment for him. He also had noted elevated creatinine, and he was on metformin. That was stopped and he was switched over to glipizide.   DISCHARGE MEDICATIONS: Aspirin 81 one tab p.o. daily, metoprolol 25 extended release 1 tab p.o. daily, lisinopril 10 mg daily, nortriptyline 25 at bedtime, oxycodone 5 mg 1 tab p.o. b.i.d. as needed, gabapentin 300 one tab p.o. daily, lovastatin 40 daily, Tylenol 650 q.4 p.r.n. for pain, Plavix 75 p.o. daily, glipizide 5 mg daily.   DIET: Low-sodium, low-fat, low-cholesterol, ADA diet.   ACTIVITY: As tolerated.   FOLLOWUP: With Dr. Wyn Quaker in 1 to 2 weeks. Follow up with Dr. Kirke Corin in 2 to 4 weeks or Dr. Mariah Milling as a new patient for systolic dysfunction. Follow with primary MD in 1 to 2 weeks.  TIME SPENT: 35 minutes.    ____________________________ Lacie Scotts Allena Katz, MD shp:gb D: 10/22/2013 14:35:19 ET T: 10/22/2013 20:29:22 ET JOB#: [id number]  cc: Nyjae Hodge H. Allena Katz, MD, <Dictator> Charise Carwin MD ELECTRONICALLY SIGNED 10/26/2013 11:28

## 2014-11-29 ENCOUNTER — Ambulatory Visit
Admit: 2014-11-29 | Disposition: A | Discharge status: Home / Self Care | Payer: Self-pay | Attending: Internal Medicine | Admitting: Internal Medicine

## 2014-11-29 LAB — CANCER CENTER HEMATOCRIT: HCT: 44 % (ref 40.0–52.0)

## 2014-12-27 DIAGNOSIS — R197 Diarrhea, unspecified: Secondary | ICD-10-CM | POA: Diagnosis not present

## 2014-12-27 DIAGNOSIS — Z720 Tobacco use: Secondary | ICD-10-CM | POA: Diagnosis not present

## 2014-12-27 DIAGNOSIS — N183 Chronic kidney disease, stage 3 (moderate): Secondary | ICD-10-CM | POA: Insufficient documentation

## 2014-12-27 DIAGNOSIS — M62838 Other muscle spasm: Secondary | ICD-10-CM | POA: Diagnosis not present

## 2014-12-27 DIAGNOSIS — Z79899 Other long term (current) drug therapy: Secondary | ICD-10-CM | POA: Insufficient documentation

## 2014-12-27 DIAGNOSIS — I129 Hypertensive chronic kidney disease with stage 1 through stage 4 chronic kidney disease, or unspecified chronic kidney disease: Secondary | ICD-10-CM | POA: Insufficient documentation

## 2014-12-27 DIAGNOSIS — M79641 Pain in right hand: Secondary | ICD-10-CM | POA: Diagnosis present

## 2014-12-27 DIAGNOSIS — J441 Chronic obstructive pulmonary disease with (acute) exacerbation: Secondary | ICD-10-CM | POA: Diagnosis not present

## 2014-12-27 DIAGNOSIS — R2243 Localized swelling, mass and lump, lower limb, bilateral: Secondary | ICD-10-CM | POA: Diagnosis not present

## 2014-12-27 DIAGNOSIS — Z7982 Long term (current) use of aspirin: Secondary | ICD-10-CM | POA: Diagnosis not present

## 2014-12-27 DIAGNOSIS — E119 Type 2 diabetes mellitus without complications: Secondary | ICD-10-CM | POA: Diagnosis not present

## 2014-12-27 DIAGNOSIS — Z7902 Long term (current) use of antithrombotics/antiplatelets: Secondary | ICD-10-CM | POA: Insufficient documentation

## 2014-12-27 LAB — BASIC METABOLIC PANEL
Anion gap: 6 (ref 5–15)
BUN: 16 mg/dL (ref 6–20)
CHLORIDE: 102 mmol/L (ref 101–111)
CO2: 27 mmol/L (ref 22–32)
Calcium: 8.4 mg/dL — ABNORMAL LOW (ref 8.9–10.3)
Creatinine, Ser: 1.66 mg/dL — ABNORMAL HIGH (ref 0.61–1.24)
GFR calc Af Amer: 51 mL/min — ABNORMAL LOW (ref >60)
GFR calc non Af Amer: 44 mL/min — ABNORMAL LOW (ref >60)
Glucose, Bld: 287 mg/dL — ABNORMAL HIGH (ref 65–99)
Potassium: 4 mmol/L (ref 3.5–5.1)
Sodium: 135 mmol/L (ref 135–145)

## 2014-12-27 LAB — CBC
HCT: 42.5 % (ref 40.0–52.0)
Hemoglobin: 13.2 g/dL (ref 13.0–18.0)
MCH: 22.2 pg — ABNORMAL LOW (ref 26.0–34.0)
MCHC: 31 g/dL — ABNORMAL LOW (ref 32.0–36.0)
MCV: 71.7 fL — AB (ref 80.0–100.0)
Platelets: 155 10*3/uL (ref 150–440)
RBC: 5.92 MIL/uL — ABNORMAL HIGH (ref 4.40–5.90)
RDW: 18.3 % — ABNORMAL HIGH (ref 11.5–14.5)
WBC: 10.8 10*3/uL — ABNORMAL HIGH (ref 3.8–10.6)

## 2014-12-27 LAB — CK: Total CK: 178 U/L (ref 49–397)

## 2014-12-27 NOTE — ED Notes (Signed)
Pt in with co fingers cramping x 2 weeks, no known injury no hx of the same.

## 2014-12-28 ENCOUNTER — Emergency Department
Admission: EM | Admit: 2014-12-28 | Discharge: 2014-12-28 | Disposition: A | Discharge status: Home / Self Care | Payer: Medicare HMO | Attending: Emergency Medicine | Admitting: Emergency Medicine

## 2014-12-28 DIAGNOSIS — M62838 Other muscle spasm: Secondary | ICD-10-CM

## 2014-12-28 LAB — MAGNESIUM: MAGNESIUM: 1.8 mg/dL (ref 1.7–2.4)

## 2014-12-28 MED ORDER — DIAZEPAM 2 MG PO TABS
2.0000 mg | ORAL_TABLET | Freq: Once | ORAL | Status: AC
  Administered 2014-12-28: 2 mg via ORAL

## 2014-12-28 MED ORDER — DIAZEPAM 2 MG PO TABS
ORAL_TABLET | ORAL | Status: AC
  Administered 2014-12-28: 2 mg via ORAL
  Filled 2014-12-28: qty 1

## 2014-12-28 MED ORDER — DIAZEPAM 2 MG PO TABS: 2.0000 mg | ORAL_TABLET | Freq: Three times a day (TID) | ORAL | Status: DC | PRN

## 2014-12-28 NOTE — Discharge Instructions (Signed)

## 2014-12-28 NOTE — ED Provider Notes (Signed)
[facility] Emergency Department Provider Note  ____________________________________________  Time seen: Approximately 151 AM  I have reviewed the triage vital signs and the nursing notes.   HISTORY  Chief Complaint Hand Pain    HPI [name] is a 59 y.o. male who comes in with 2 weeks of hands spasms. He reports that he has spasms in his arms or make his fingers cramped. He reports that the symptoms come and go. He reports that at 2230 he was having spasms in cramping of his hands bilaterally. He reports that the right hand has since stopped cramping but he still having some cramping in his left thumb. The patient reports that he has not seen his doctor for this yet. The patient has been having these episodes every afternoon and reports that they seemed to start when he is sitting and watching television. The patient reports that they were very bad tonight. Per his wife he does not drink much water and that he drinks a lot of Pepsi. The patient also does not do a very good job of trying to control his blood sugars. He reports his pain is a 6 out of 10 in intensity.   Past Medical History  Diagnosis Date  . COPD (chronic obstructive pulmonary disease)   . Hyperlipidemia   . Hypertension   . Borderline DM     a. Prev on meds- taken off.  . Peripheral neuropathy   . Tobacco abuse     a. 46 yrs, up to 2 ppd, changed to e-cigarette 05/2013.  . Cardiomyopathy     a. 10/2013 Echo: EF 35-40%, mild MR/TR;  b. 11/2013 Lexi MV: EF 46%, fixed apical defect with small distal antsept ischemia->overall low-risk->med Rx.  . Secondary erythrocytosis     a. followed by heme-onc with periodic phlebotomy.  . CKD (chronic kidney disease), stage III     a. Creat 1.42 (10/2013).  . Ischemic toe     a. 10/2013 L fifth toe - felt to be either 2/2 embolic plaque vs erythrocytosis - seen by vascular surgery (Dew) ->conservative rx.  Marland Kitchen LBBB (left bundle branch block)      Patient Active Problem List   Diagnosis Date Noted  . CAD (coronary artery disease) 01/07/2014  . SOB (shortness of breath) 11/16/2013  . Chest pain 11/16/2013  . Morbid obesity 11/16/2013  . Hyperlipidemia 11/16/2013  . Diabetes mellitus, type 2 11/16/2013  . Smoker 11/16/2013  . Left bundle branch block 11/16/2013  . Ischemic toe 11/16/2013  . Congestive dilated cardiomyopathy 11/16/2013    No past surgical history on file.  Current Outpatient Rx  Name  Route  Sig  Dispense  Refill  . aspirin 81 MG tablet   Oral   Take 81 mg by mouth daily.         . clopidogrel (PLAVIX) 75 MG tablet   Oral   Take 75 mg by mouth daily with breakfast.         . diazepam (VALIUM) 2 MG tablet   Oral   Take 1 tablet (2 mg total) by mouth every 8 (eight) hours as needed for muscle spasms.   15 tablet   0   . isosorbide mononitrate (IMDUR) 30 MG 24 hr tablet   Oral   Take 0.5 tablets (15 mg total) by mouth daily.   90 tablet   3   . lisinopril (PRINIVIL,ZESTRIL) 20 MG tablet   Oral   Take 10 mg by mouth daily.         Marland Kitchen  lovastatin (MEVACOR) 40 MG tablet   Oral   Take 40 mg by mouth at bedtime.         . metoprolol tartrate (LOPRESSOR) 25 MG tablet   Oral   Take 1 tablet (25 mg total) by mouth 2 (two) times daily.   180 tablet   3   . nitroGLYCERIN (NITROSTAT) 0.4 MG SL tablet   Sublingual   Place 1 tablet (0.4 mg total) under the tongue every 5 (five) minutes as needed for chest pain.   25 tablet   3   . oxyCODONE (OXY IR/ROXICODONE) 5 MG immediate release tablet   Oral   Take 5 mg by mouth every 4 (four) hours as needed for severe pain.         . traZODone (DESYREL) 150 MG tablet      TAKE 1 TABLET BY MOUTH AT BEDTIME.   30 tablet   0     Per Dr. Mariah Milling the patient can only have 30 tablet ...     Allergies Review of patient's allergies indicates no known allergies.  Family History  Problem Relation Age of Onset  . Hypertension Mother     alive  @ 74  . Hyperlipidemia Mother   . Hypertension Brother   . Hyperlipidemia Brother   . Hypertension Maternal Aunt   . Hypertension Brother   . Hyperlipidemia Brother   . Hypertension Brother   . Hyperlipidemia Brother   . Diabetes Brother     Social History History  Substance Use Topics  . Smoking status: Current Every Day Smoker -- 2.00 packs/day for 46 years    Types: Cigarettes, E-cigarettes  . Smokeless tobacco: Not on file     Comment: Currently smoking E cigarettes.  . Alcohol Use: No     Comment: alcohol abuse in the past.     Review of Systems Constitutional: No fever/chills Eyes: No visual changes. ENT: No sore throat. Cardiovascular: Denies chest pain. Respiratory:  shortness of breath. Gastrointestinal: Diarrhea Genitourinary: Negative for dysuria. Musculoskeletal:  back pain. Skin: Negative for rash. Neurological: Negative for headaches, focal weakness or numbness. Hematological/Lymphatic:Bilateral leg swelling  10-point ROS otherwise negative.  ____________________________________________   PHYSICAL EXAM:  VITAL SIGNS: ED Triage Vitals  Enc Vitals Group     BP 12/27/14 2245 136/73 mmHg     Pulse Rate 12/27/14 2245 98     Resp 12/27/14 2245 18     Temp 12/27/14 2245 98.2 F (36.8 C)     Temp Source 12/27/14 2245 Oral     SpO2 12/27/14 2245 96 %     Weight 12/27/14 2245 348 lb (157.852 kg)     Height 12/27/14 2245 6' (1.829 m)     Head Cir --      Peak Flow --      Pain Score 12/27/14 2246 5     Pain Loc --      Pain Edu? --      Excl. in GC? --     Constitutional: Alert and oriented. Well appearing and in no acute distress. Eyes: Conjunctivae are normal. PERRL. EOMI. Head: Atraumatic. Nose: No congestion/rhinnorhea. Mouth/Throat: Mucous membranes are moist.  Oropharynx non-erythematous. Cardiovascular: Normal rate, regular rhythm. Grossly normal heart sounds.  Good peripheral circulation. Respiratory: Normal respiratory effort.  No  retractions. Lungs CTAB. Gastrointestinal: Soft and nontender. Obese abdomen, positive bowel sounds Genitourinary: Deferred Musculoskeletal: Lower extremity edema, left thumb spasm Neurologic:  Normal speech and language. No gross focal neurologic deficits are appreciated.  Skin:  Skin is warm, dry and intact. No rash noted. Psychiatric: Mood and affect are normal.  ____________________________________________   LABS (all labs ordered are listed, but only abnormal results are displayed)  Labs Reviewed  CBC - Abnormal; Notable for the following:    WBC 10.8 (*)    RBC 5.92 (*)    MCV 71.7 (*)    MCH 22.2 (*)    MCHC 31.0 (*)    RDW 18.3 (*)    All other components within normal limits  BASIC METABOLIC PANEL - Abnormal; Notable for the following:    Glucose, Bld 287 (*)    Creatinine, Ser 1.66 (*)    Calcium 8.4 (*)    GFR calc non Af Amer 44 (*)    GFR calc Af Amer 51 (*)    All other components within normal limits  CK  MAGNESIUM   ____________________________________________  EKG  None ____________________________________________  RADIOLOGY  None ____________________________________________   PROCEDURES  Procedure(s) performed: None  Critical Care performed: No  ____________________________________________   INITIAL IMPRESSION / ASSESSMENT AND PLAN / ED COURSE  Pertinent labs & imaging results that were available during my care of the patient were reviewed by me and considered in my medical decision making (see chart for details).  This is a 59 year old male who comes in with 2 weeks of the lateral hand spasm. He reports that the symptoms do come and go but he has not been drinking water trying anything at home for the spasms. I will check the patient's magnesium as the remainder of his electrolytes unremarkable and determine if that may be a cause of the spasm. I will give the patient dose of Valium as well to help with the spasms.  The patient's  magnesium is unremarkable. I will discharge the patient to follow-up with his primary care physician for further evaluation. ____________________________________________   FINAL CLINICAL IMPRESSION(S) / ED DIAGNOSES  Final diagnoses:  Muscle spasm      Rebecka Apley, MD 12/28/14 [phone]

## 2014-12-28 NOTE — ED Notes (Signed)
Pt sitting on stretcher in room with no distress noted; Awaiting MD orders. Will continue to monitor.

## 2014-12-28 NOTE — ED Notes (Signed)
Pt uprite on stretcher in exam room with no distress noted; SO at bedside; pt reports having cramping to fingers for the last 2wks; denies hx of same; tremors noted to hands; grips =

## 2015-01-09 ENCOUNTER — Emergency Department
Admission: EM | Admit: 2015-01-09 | Discharge: 2015-01-09 | Disposition: A | Discharge status: Home / Self Care | Payer: Medicare HMO | Attending: Emergency Medicine | Admitting: Emergency Medicine

## 2015-01-09 ENCOUNTER — Encounter: Payer: Self-pay | Admitting: Emergency Medicine

## 2015-01-09 ENCOUNTER — Other Ambulatory Visit: Payer: Self-pay

## 2015-01-09 DIAGNOSIS — Z79899 Other long term (current) drug therapy: Secondary | ICD-10-CM | POA: Insufficient documentation

## 2015-01-09 DIAGNOSIS — Z720 Tobacco use: Secondary | ICD-10-CM | POA: Diagnosis not present

## 2015-01-09 DIAGNOSIS — I10 Essential (primary) hypertension: Secondary | ICD-10-CM | POA: Insufficient documentation

## 2015-01-09 DIAGNOSIS — Z7982 Long term (current) use of aspirin: Secondary | ICD-10-CM | POA: Insufficient documentation

## 2015-01-09 DIAGNOSIS — E1165 Type 2 diabetes mellitus with hyperglycemia: Secondary | ICD-10-CM | POA: Diagnosis present

## 2015-01-09 DIAGNOSIS — Z7902 Long term (current) use of antithrombotics/antiplatelets: Secondary | ICD-10-CM | POA: Insufficient documentation

## 2015-01-09 LAB — BASIC METABOLIC PANEL
ANION GAP: 9 (ref 5–15)
BUN: 14 mg/dL (ref 6–20)
CO2: 29 mmol/L (ref 22–32)
Calcium: 8.4 mg/dL — ABNORMAL LOW (ref 8.9–10.3)
Chloride: 92 mmol/L — ABNORMAL LOW (ref 101–111)
Creatinine, Ser: 1.67 mg/dL — ABNORMAL HIGH (ref 0.61–1.24)
GFR calc non Af Amer: 44 mL/min — ABNORMAL LOW (ref >60)
GFR, EST AFRICAN AMERICAN: 51 mL/min — AB (ref >60)
Glucose, Bld: 446 mg/dL — ABNORMAL HIGH (ref 65–99)
POTASSIUM: 4.2 mmol/L (ref 3.5–5.1)
SODIUM: 130 mmol/L — AB (ref 135–145)

## 2015-01-09 LAB — URINALYSIS COMPLETE WITH MICROSCOPIC (ARMC ONLY)
Bacteria, UA: NONE SEEN
Bilirubin Urine: NEGATIVE
Glucose, UA: >500 mg/dL — AB
HGB URINE DIPSTICK: NEGATIVE
Ketones, ur: NEGATIVE mg/dL
Leukocytes, UA: NEGATIVE
NITRITE: NEGATIVE
PH: 7 (ref 5.0–8.0)
Protein, ur: NEGATIVE mg/dL
RBC / HPF: NONE SEEN RBC/hpf (ref 0–5)
SPECIFIC GRAVITY, URINE: 1.022 (ref 1.005–1.030)
SQUAMOUS EPITHELIAL / LPF: NONE SEEN
WBC, UA: NONE SEEN WBC/hpf (ref 0–5)

## 2015-01-09 LAB — CBC
HCT: 48 % (ref 40.0–52.0)
HEMOGLOBIN: 14.6 g/dL (ref 13.0–18.0)
MCH: 22.5 pg — AB (ref 26.0–34.0)
MCHC: 30.5 g/dL — ABNORMAL LOW (ref 32.0–36.0)
MCV: 73.8 fL — ABNORMAL LOW (ref 80.0–100.0)
Platelets: 192 10*3/uL (ref 150–440)
RBC: 6.51 MIL/uL — ABNORMAL HIGH (ref 4.40–5.90)
RDW: 19.1 % — AB (ref 11.5–14.5)
WBC: 14.8 10*3/uL — AB (ref 3.8–10.6)

## 2015-01-09 LAB — GLUCOSE, CAPILLARY: Glucose-Capillary: 191 mg/dL — ABNORMAL HIGH (ref 65–99)

## 2015-01-09 MED ORDER — DIAZEPAM 2 MG PO TABS: 2.0000 mg | ORAL_TABLET | Freq: Three times a day (TID) | ORAL | Status: DC | PRN

## 2015-01-09 MED ORDER — SODIUM CHLORIDE 0.9 % IV BOLUS (SEPSIS)
1000.0000 mL | INTRAVENOUS | Status: AC
  Administered 2015-01-09: 1000 mL via INTRAVENOUS

## 2015-01-09 NOTE — Discharge Instructions (Signed)
Your high sugars certainly worse due to the soda that you have been drinking. Please stop drinking sugarary beverages. Continue to take your current medication. Follow-up with your regular doctors at [facility]. We have written a prescription for Valium due to the hand spasms, but note that this is not a good long-term medication. Consider taking magnesium before bedtime. He can take [phone] mg. Return to the emergency department if you have any urgent concerns.  High Blood Sugar High blood sugar (hyperglycemia) means that the level of sugar in your blood is higher than it should be. Signs of high blood sugar include:  Feeling thirsty.  Frequent peeing (urinating).  Feeling tired or sleepy.  Dry mouth.  Vision changes.  Feeling weak.  Feeling hungry but losing weight.  Numbness and tingling in your hands or feet.  Headache. When you ignore these signs, your blood sugar may keep going up. These problems may get worse, and other problems may begin. HOME CARE  Check your blood sugars as told by your doctor. Write down the numbers with the date and time.  Take the right amount of insulin or diabetes pills at the right time. Write down the dose with date and time.  Refill your insulin or diabetes pills before running out.  Watch what you eat. Follow your meal plan.  Drink liquids without sugar, such as water. Check with your doctor if you have kidney or heart disease.  Follow your doctor's orders for exercise. Exercise at the same time of day.  Keep your doctor's appointments. GET HELP RIGHT AWAY IF:   You have trouble thinking or are confused.  You have fast breathing with fruity smelling breath.  You pass out (faint).  You have 2 to 3 days of high blood sugars and you do not know why.  You have chest pain.  You are feeling sick to your stomach (nauseous) or throwing up (vomiting).  You have sudden vision changes. MAKE SURE YOU:   Understand these  instructions.  Will watch your condition.  Will get help right away if you are not doing well or get worse. Document Released: 05/19/2009 Document Revised: 10/14/2011 Document Reviewed: 05/19/2009 [facility] Patient Information 2015 [place], [place]. This information is not intended to replace advice given to you by your health care provider. Make sure you discuss any questions you have with your health care provider.

## 2015-01-09 NOTE — ED Notes (Signed)
Sent in from pmd's office dizziness and elevated blood sugar

## 2015-01-09 NOTE — ED Provider Notes (Signed)
[facility] Emergency Department Provider Note  ____________________________________________  Time seen: 1730  I have reviewed the triage vital signs and the nursing notes.   HISTORY  Chief Complaint Hyperglycemia     HPI [name] is a 59 y.o. male  with known diabetes who is not very compliant. He does take glipizide. He does not measure his blood sugar levels usually. He does drink non-diet sodas. He he says he drinks as many as 6 a day. He was seen here recently for a similar episode of hyperglycemia. This was related to some spasms and discomfort in his hands.  Today he went to [facility] to get a refill for medication that was supposed to help with the hand spasms (valium ). They rechecked his sugar and found it was critical high on their glucometer's. He was sent to the emergency department.    Past Medical History  Diagnosis Date  . COPD (chronic obstructive pulmonary disease)   . Hyperlipidemia   . Hypertension   . Borderline DM     a. Prev on meds- taken off.  . Peripheral neuropathy   . Tobacco abuse     a. 46 yrs, up to 2 ppd, changed to e-cigarette 05/2013.  . Cardiomyopathy     a. 10/2013 Echo: EF 35-40%, mild MR/TR;  b. 11/2013 Lexi MV: EF 46%, fixed apical defect with small distal antsept ischemia->overall low-risk->med Rx.  . Secondary erythrocytosis     a. followed by heme-onc with periodic phlebotomy.  . CKD (chronic kidney disease), stage III     a. Creat 1.42 (10/2013).  . Ischemic toe     a. 10/2013 L fifth toe - felt to be either 2/2 embolic plaque vs erythrocytosis - seen by vascular surgery (D[facility]) ->conservative rx.  Marland Kitchen LBBB (left bundle branch block)     Patient Active Problem List   Diagnosis Date Noted  . CAD (coronary artery disease) 01/07/2014  . SOB (shortness of breath) 11/16/2013  . Chest pain 11/16/2013  . Morbid obesity 11/16/2013  . Hyperlipidemia 11/16/2013  . Diabetes mellitus, type 2 11/16/2013  .  Smoker 11/16/2013  . Left bundle branch block 11/16/2013  . Ischemic toe 11/16/2013  . Congestive dilated cardiomyopathy 11/16/2013    History reviewed. No pertinent past surgical history.  Current Outpatient Rx  Name  Route  Sig  Dispense  Refill  . aspirin 81 MG tablet   Oral   Take 81 mg by mouth daily.         . clopidogrel (PLAVIX) 75 MG tablet   Oral   Take 75 mg by mouth daily with breakfast.         . diazepam (VALIUM) 2 MG tablet   Oral   Take 1 tablet (2 mg total) by mouth every 8 (eight) hours as needed for muscle spasms.   15 tablet   0   . isosorbide mononitrate (IMDUR) 30 MG 24 hr tablet   Oral   Take 0.5 tablets (15 mg total) by mouth daily.   90 tablet   3   . lisinopril (PRINIVIL,ZESTRIL) 20 MG tablet   Oral   Take 10 mg by mouth daily.         Marland Kitchen lovastatin (MEVACOR) 40 MG tablet   Oral   Take 40 mg by mouth at bedtime.         . metoprolol tartrate (LOPRESSOR) 25 MG tablet   Oral   Take 1 tablet (25 mg total) by mouth  2 (two) times daily.   180 tablet   3   . nitroGLYCERIN (NITROSTAT) 0.4 MG SL tablet   Sublingual   Place 1 tablet (0.4 mg total) under the tongue every 5 (five) minutes as needed for chest pain.   25 tablet   3   . oxyCODONE (OXY IR/ROXICODONE) 5 MG immediate release tablet   Oral   Take 5 mg by mouth every 4 (four) hours as needed for severe pain.         . traZODone (DESYREL) 150 MG tablet      TAKE 1 TABLET BY MOUTH AT BEDTIME.   30 tablet   0     Per Dr. Mariah Milling the patient can only have 30 tablet ...     Allergies Review of patient's allergies indicates no known allergies.  Family History  Problem Relation Age of Onset  . Hypertension Mother     alive @ 72  . Hyperlipidemia Mother   . Hypertension Brother   . Hyperlipidemia Brother   . Hypertension Maternal Aunt   . Hypertension Brother   . Hyperlipidemia Brother   . Hypertension Brother   . Hyperlipidemia Brother   . Diabetes Brother      Social History History  Substance Use Topics  . Smoking status: Current Every Day Smoker -- 2.00 packs/day for 46 years    Types: Cigarettes, E-cigarettes  . Smokeless tobacco: Not on file     Comment: Currently smoking E cigarettes.  . Alcohol Use: No     Comment: alcohol abuse in the past.     Review of Systems  Constitutional: Negative for fever. ENT: Negative for sore throat. Cardiovascular: Negative for chest pain. Respiratory: Negative for shortness of breath. Gastrointestinal: Negative for abdominal pain, vomiting and diarrhea. Genitourinary: Negative for dysuria. Musculoskeletal: Positive for spasms and his hand. This is been occurring for a few weeks. Worse at night. See history of present illness   Skin: Negative for rash. Neurological: Negative for headaches   10-point ROS otherwise negative.  ____________________________________________   PHYSICAL EXAM:  VITAL SIGNS: ED Triage Vitals  Enc Vitals Group     BP 01/09/15 1445 142/77 mmHg     Pulse Rate 01/09/15 1445 88     Resp 01/09/15 1445 20     Temp 01/09/15 1444 98.2 F (36.8 C)     Temp Source 01/09/15 1445 Oral     SpO2 01/09/15 1445 98 %     Weight 01/09/15 1445 341 lb (154.677 kg)     Height 01/09/15 1445 6' (1.829 m)     Head Cir --      Peak Flow --      Pain Score 01/09/15 1445 0     Pain Loc --      Pain Edu? --      Excl. in GC? --     Constitutional: Alert and oriented. Well appearing and in no distress. ENT   Head: Normocephalic and atraumatic.   Nose: No congestion/rhinnorhea.   Mouth/Throat: Mucous membranes are moist. Cardiovascular: Normal rate, regular rhythm. Respiratory: Normal respiratory effort without tachypnea. Breath sounds are clear and equal bilaterally. No wheezes/rales/rhonchi. Gastrointestinal: Soft and nontender. No distention.  Back: No muscle spasm, no tenderness, no CVA tenderness. Musculoskeletal: Nontender with normal range of motion in all  extremities.  No noted edema. Neurologic:  Normal speech and language. No gross focal neurologic deficits are appreciated.  Skin:  Skin is warm, dry. No rash noted. Psychiatric: Mood and affect  are normal. Speech and behavior are normal.  ____________________________________________    LABS (pertinent positives/negatives)  CBC shows elevated white count at 14.8 hemoglobin 14.6 Metabolic panel shows sodium of 1:30 potassium is 4.2, and a glucose of 446. Renal function is okay with BUN of 14 and creatinine 1.67.  ____________________________________________   EKG  ED ECG REPORT I, Finneas Mathe W, the attending physician, personally viewed and interpreted this ECG.   Date: 01/09/2015  EKG Time: 1450  Rate: 94  Rhythm: Sinus rhythm with PACs  Axis: -50 left  Intervals: PR is 206 QRS is 150 QTC is 510, all are elongated with a left bundle branch block present.  Comparison to a prior EKG in February 2016-the left bundle branch block is old.  ____________________________________________    INITIAL IMPRESSION / ASSESSMENT AND PLAN / ED COURSE  Patient with hyperglycemia largely due to his preference for a large amount of Pepsi-Cola every day. He drinks approximately 6. He has been taking his glyburide. I had considered adding metformin, but given the clarification that he was drinking a large amount of sweet sodas I have counseled him to reduce this. I would like for him to follow with his regular physician and see how his sugars are running after the diet modification.  The patient does look well and is in no acute distress. We've discussed about the spasms in his hands. Already prescription for the 2 mg Valium for the next few days but I have explained this is not a long-term medication for him. I spoke with him about trying magnesium at night as well.  2100 Patient's glucose recheck was 191. This was without any diabetic medication. He received 1 L of normal saline. Without any  sodas his glucose seems to be dropping fairly well. We've counseled him to reduce his intake. We will discharge him home to continue his current regular medicine. ____________________________________________     FINAL CLINICAL IMPRESSION(S) / ED DIAGNOSES  Final diagnoses:  Hyperglycemia due to type 2 diabetes mellitus      Darien Ramus, MD 01/09/15 2203

## 2015-01-09 NOTE — ED Notes (Signed)
Pt discharged home after verbalizing understanding of discharge instructions; nad noted. 

## 2015-01-09 NOTE — ED Notes (Signed)
Sent in from [facility] with elevated blood sugar

## 2015-01-09 NOTE — ED Notes (Signed)
Patient originally went [name] to get a refill on medication due to spasms in his hands.  They took his CBG 2x and their meter was unable to read it.  They sent him here because it was so high.  Patient states he has been dizzy and short of breath for two days.  He only takes glipizide currently. Patient has been more fatigued the past week.

## 2015-01-17 ENCOUNTER — Emergency Department: Payer: Medicare HMO

## 2015-01-17 ENCOUNTER — Inpatient Hospital Stay
Admission: EM | Admit: 2015-01-17 | Discharge: 2015-01-20 | DRG: 580 | Disposition: A | Discharge status: Home / Self Care | Payer: Medicare HMO | Attending: Internal Medicine | Admitting: Internal Medicine

## 2015-01-17 ENCOUNTER — Encounter: Payer: Self-pay | Admitting: Emergency Medicine

## 2015-01-17 DIAGNOSIS — Z7902 Long term (current) use of antithrombotics/antiplatelets: Secondary | ICD-10-CM | POA: Diagnosis not present

## 2015-01-17 DIAGNOSIS — L0291 Cutaneous abscess, unspecified: Secondary | ICD-10-CM | POA: Diagnosis present

## 2015-01-17 DIAGNOSIS — E1142 Type 2 diabetes mellitus with diabetic polyneuropathy: Secondary | ICD-10-CM | POA: Diagnosis present

## 2015-01-17 DIAGNOSIS — G629 Polyneuropathy, unspecified: Secondary | ICD-10-CM

## 2015-01-17 DIAGNOSIS — N183 Chronic kidney disease, stage 3 unspecified: Secondary | ICD-10-CM | POA: Diagnosis present

## 2015-01-17 DIAGNOSIS — Z79899 Other long term (current) drug therapy: Secondary | ICD-10-CM | POA: Diagnosis not present

## 2015-01-17 DIAGNOSIS — E119 Type 2 diabetes mellitus without complications: Secondary | ICD-10-CM

## 2015-01-17 DIAGNOSIS — W228XXA Striking against or struck by other objects, initial encounter: Secondary | ICD-10-CM | POA: Diagnosis present

## 2015-01-17 DIAGNOSIS — N1831 Chronic kidney disease, stage 3a: Secondary | ICD-10-CM | POA: Diagnosis present

## 2015-01-17 DIAGNOSIS — Z7982 Long term (current) use of aspirin: Secondary | ICD-10-CM

## 2015-01-17 DIAGNOSIS — L02612 Cutaneous abscess of left foot: Secondary | ICD-10-CM

## 2015-01-17 DIAGNOSIS — F17210 Nicotine dependence, cigarettes, uncomplicated: Secondary | ICD-10-CM | POA: Diagnosis present

## 2015-01-17 DIAGNOSIS — I10 Essential (primary) hypertension: Secondary | ICD-10-CM

## 2015-01-17 DIAGNOSIS — I5042 Chronic combined systolic (congestive) and diastolic (congestive) heart failure: Secondary | ICD-10-CM

## 2015-01-17 DIAGNOSIS — D751 Secondary polycythemia: Secondary | ICD-10-CM | POA: Diagnosis present

## 2015-01-17 DIAGNOSIS — I447 Left bundle-branch block, unspecified: Secondary | ICD-10-CM | POA: Diagnosis present

## 2015-01-17 DIAGNOSIS — G609 Hereditary and idiopathic neuropathy, unspecified: Secondary | ICD-10-CM | POA: Insufficient documentation

## 2015-01-17 DIAGNOSIS — E785 Hyperlipidemia, unspecified: Secondary | ICD-10-CM | POA: Diagnosis present

## 2015-01-17 DIAGNOSIS — B9561 Methicillin susceptible Staphylococcus aureus infection as the cause of diseases classified elsewhere: Secondary | ICD-10-CM | POA: Diagnosis present

## 2015-01-17 DIAGNOSIS — I129 Hypertensive chronic kidney disease with stage 1 through stage 4 chronic kidney disease, or unspecified chronic kidney disease: Secondary | ICD-10-CM | POA: Diagnosis present

## 2015-01-17 DIAGNOSIS — I2510 Atherosclerotic heart disease of native coronary artery without angina pectoris: Secondary | ICD-10-CM | POA: Diagnosis present

## 2015-01-17 DIAGNOSIS — L02619 Cutaneous abscess of unspecified foot: Secondary | ICD-10-CM | POA: Diagnosis present

## 2015-01-17 DIAGNOSIS — L03116 Cellulitis of left lower limb: Principal | ICD-10-CM | POA: Diagnosis present

## 2015-01-17 DIAGNOSIS — L0889 Other specified local infections of the skin and subcutaneous tissue: Secondary | ICD-10-CM | POA: Diagnosis present

## 2015-01-17 DIAGNOSIS — B999 Unspecified infectious disease: Secondary | ICD-10-CM

## 2015-01-17 DIAGNOSIS — J449 Chronic obstructive pulmonary disease, unspecified: Secondary | ICD-10-CM | POA: Diagnosis present

## 2015-01-17 DIAGNOSIS — L03119 Cellulitis of unspecified part of limb: Secondary | ICD-10-CM

## 2015-01-17 HISTORY — DX: Systemic involvement of connective tissue, unspecified: M35.9

## 2015-01-17 HISTORY — DX: Essential (primary) hypertension: I10

## 2015-01-17 HISTORY — DX: Cutaneous abscess of unspecified foot: L02.619

## 2015-01-17 HISTORY — DX: Hereditary and idiopathic neuropathy, unspecified: G60.9

## 2015-01-17 HISTORY — DX: Chronic combined systolic (congestive) and diastolic (congestive) heart failure: I50.42

## 2015-01-17 HISTORY — DX: Type 2 diabetes mellitus without complications: E11.9

## 2015-01-17 LAB — CBC WITH DIFFERENTIAL/PLATELET
Basophils Absolute: 0.1 10*3/uL (ref 0–0.1)
Basophils Relative: 1 %
Eosinophils Absolute: 0.2 10*3/uL (ref 0–0.7)
Eosinophils Relative: 2 %
HCT: 40.3 % (ref 40.0–52.0)
Hemoglobin: 12.5 g/dL — ABNORMAL LOW (ref 13.0–18.0)
LYMPHS ABS: 1.5 10*3/uL (ref 1.0–3.6)
LYMPHS PCT: 12 %
MCH: 22.4 pg — ABNORMAL LOW (ref 26.0–34.0)
MCHC: 31 g/dL — AB (ref 32.0–36.0)
MCV: 72.4 fL — ABNORMAL LOW (ref 80.0–100.0)
Monocytes Absolute: 0.9 10*3/uL (ref 0.2–1.0)
Monocytes Relative: 7 %
NEUTROS PCT: 78 %
Neutro Abs: 10.1 10*3/uL — ABNORMAL HIGH (ref 1.4–6.5)
Platelets: 290 10*3/uL (ref 150–440)
RBC: 5.56 MIL/uL (ref 4.40–5.90)
RDW: 19.5 % — ABNORMAL HIGH (ref 11.5–14.5)
WBC: 12.8 10*3/uL — ABNORMAL HIGH (ref 3.8–10.6)

## 2015-01-17 LAB — BASIC METABOLIC PANEL
ANION GAP: 5 (ref 5–15)
BUN: 24 mg/dL — AB (ref 6–20)
CALCIUM: 8.4 mg/dL — AB (ref 8.9–10.3)
CHLORIDE: 99 mmol/L — AB (ref 101–111)
CO2: 27 mmol/L (ref 22–32)
Creatinine, Ser: 1.76 mg/dL — ABNORMAL HIGH (ref 0.61–1.24)
GFR calc Af Amer: 48 mL/min — ABNORMAL LOW (ref >60)
GFR, EST NON AFRICAN AMERICAN: 41 mL/min — AB (ref >60)
Glucose, Bld: 292 mg/dL — ABNORMAL HIGH (ref 65–99)
Potassium: 4.7 mmol/L (ref 3.5–5.1)
Sodium: 131 mmol/L — ABNORMAL LOW (ref 135–145)

## 2015-01-17 LAB — LACTIC ACID, PLASMA: Lactic Acid, Venous: 0.9 mmol/L (ref 0.5–2.0)

## 2015-01-17 LAB — GLUCOSE, CAPILLARY: Glucose-Capillary: 177 mg/dL — ABNORMAL HIGH (ref 65–99)

## 2015-01-17 MED ORDER — METOPROLOL TARTRATE 25 MG PO TABS
25.0000 mg | ORAL_TABLET | Freq: Two times a day (BID) | ORAL | Status: DC
  Administered 2015-01-17 – 2015-01-20 (×6): 25 mg via ORAL
  Filled 2015-01-17 (×6): qty 1

## 2015-01-17 MED ORDER — INSULIN ASPART 100 UNIT/ML ~~LOC~~ SOLN
0.0000 [IU] | Freq: Three times a day (TID) | SUBCUTANEOUS | Status: AC
  Administered 2015-01-17: 2 [IU] via SUBCUTANEOUS
  Filled 2015-01-17: qty 2

## 2015-01-17 MED ORDER — ISOSORBIDE MONONITRATE ER 30 MG PO TB24
15.0000 mg | ORAL_TABLET | Freq: Every day | ORAL | Status: DC
  Administered 2015-01-18 – 2015-01-20 (×3): 15 mg via ORAL
  Filled 2015-01-17 (×3): qty 1

## 2015-01-17 MED ORDER — LISINOPRIL 10 MG PO TABS
10.0000 mg | ORAL_TABLET | Freq: Every day | ORAL | Status: DC
  Administered 2015-01-18 – 2015-01-20 (×3): 10 mg via ORAL
  Filled 2015-01-17 (×3): qty 1

## 2015-01-17 MED ORDER — SENNOSIDES-DOCUSATE SODIUM 8.6-50 MG PO TABS: 1.0000 | ORAL_TABLET | Freq: Every evening | ORAL | Status: DC | PRN

## 2015-01-17 MED ORDER — CLOPIDOGREL BISULFATE 75 MG PO TABS
75.0000 mg | ORAL_TABLET | Freq: Every day | ORAL | Status: DC
  Administered 2015-01-18 – 2015-01-20 (×3): 75 mg via ORAL
  Filled 2015-01-17 (×3): qty 1

## 2015-01-17 MED ORDER — CLINDAMYCIN PHOSPHATE 600 MG/50ML IV SOLN
INTRAVENOUS | Status: AC
  Administered 2015-01-17: 600 mg via INTRAVENOUS
  Filled 2015-01-17: qty 50

## 2015-01-17 MED ORDER — VANCOMYCIN HCL IN DEXTROSE 1-5 GM/200ML-% IV SOLN
INTRAVENOUS | Status: AC
  Administered 2015-01-17: 1000 mg via INTRAVENOUS
  Filled 2015-01-17: qty 200

## 2015-01-17 MED ORDER — MORPHINE SULFATE 4 MG/ML IJ SOLN
4.0000 mg | Freq: Once | INTRAMUSCULAR | Status: AC
  Administered 2015-01-17: 4 mg via INTRAVENOUS

## 2015-01-17 MED ORDER — HEPARIN SODIUM (PORCINE) 5000 UNIT/ML IJ SOLN
5000.0000 [IU] | Freq: Three times a day (TID) | INTRAMUSCULAR | Status: DC
  Administered 2015-01-17 – 2015-01-20 (×7): 5000 [IU] via SUBCUTANEOUS
  Filled 2015-01-17 (×7): qty 1

## 2015-01-17 MED ORDER — ALBUTEROL SULFATE (2.5 MG/3ML) 0.083% IN NEBU: 3.0000 mL | INHALATION_SOLUTION | RESPIRATORY_TRACT | Status: DC | PRN

## 2015-01-17 MED ORDER — PRAVASTATIN SODIUM 20 MG PO TABS
40.0000 mg | ORAL_TABLET | Freq: Every day | ORAL | Status: DC
  Administered 2015-01-19: 40 mg via ORAL
  Filled 2015-01-17: qty 2

## 2015-01-17 MED ORDER — ACETAMINOPHEN 650 MG RE SUPP: 650.0000 mg | Freq: Four times a day (QID) | RECTAL | Status: DC | PRN

## 2015-01-17 MED ORDER — CLINDAMYCIN PHOSPHATE 600 MG/50ML IV SOLN
600.0000 mg | Freq: Once | INTRAVENOUS | Status: AC
  Administered 2015-01-17: 600 mg via INTRAVENOUS

## 2015-01-17 MED ORDER — PIPERACILLIN-TAZOBACTAM 3.375 G IVPB
3.3750 g | Freq: Once | INTRAVENOUS | Status: AC
  Administered 2015-01-17: 3.375 g via INTRAVENOUS

## 2015-01-17 MED ORDER — MORPHINE SULFATE 4 MG/ML IJ SOLN
4.0000 mg | INTRAMUSCULAR | Status: DC | PRN
  Administered 2015-01-17 – 2015-01-20 (×5): 4 mg via INTRAVENOUS
  Filled 2015-01-17 (×5): qty 1

## 2015-01-17 MED ORDER — SODIUM CHLORIDE 0.9 % IV SOLN
Freq: Once | INTRAVENOUS | Status: AC
  Administered 2015-01-17: 17:00:00 via INTRAVENOUS

## 2015-01-17 MED ORDER — PREGABALIN 50 MG PO CAPS
100.0000 mg | ORAL_CAPSULE | Freq: Three times a day (TID) | ORAL | Status: DC
  Administered 2015-01-17 – 2015-01-20 (×7): 100 mg via ORAL
  Filled 2015-01-17 (×7): qty 2

## 2015-01-17 MED ORDER — ASPIRIN 81 MG PO CHEW
81.0000 mg | CHEWABLE_TABLET | Freq: Every day | ORAL | Status: DC
  Administered 2015-01-18 – 2015-01-20 (×3): 81 mg via ORAL
  Filled 2015-01-17 (×3): qty 1

## 2015-01-17 MED ORDER — SODIUM CHLORIDE 0.9 % IV SOLN
INTRAVENOUS | Status: AC
  Administered 2015-01-17: 23:00:00 via INTRAVENOUS

## 2015-01-17 MED ORDER — SERTRALINE HCL 50 MG PO TABS
50.0000 mg | ORAL_TABLET | Freq: Every day | ORAL | Status: DC
  Administered 2015-01-18 – 2015-01-20 (×3): 50 mg via ORAL
  Filled 2015-01-17 (×4): qty 1

## 2015-01-17 MED ORDER — MORPHINE SULFATE 4 MG/ML IJ SOLN
INTRAMUSCULAR | Status: AC
  Administered 2015-01-17: 4 mg via INTRAVENOUS
  Filled 2015-01-17: qty 1

## 2015-01-17 MED ORDER — ACETAMINOPHEN 325 MG PO TABS: 650.0000 mg | ORAL_TABLET | Freq: Four times a day (QID) | ORAL | Status: DC | PRN

## 2015-01-17 MED ORDER — INSULIN ASPART 100 UNIT/ML ~~LOC~~ SOLN
0.0000 [IU] | Freq: Four times a day (QID) | SUBCUTANEOUS | Status: DC
Start: 2015-01-18 — End: 2015-01-20
  Administered 2015-01-18: 1 [IU] via SUBCUTANEOUS
  Administered 2015-01-18 – 2015-01-19 (×3): 2 [IU] via SUBCUTANEOUS
  Administered 2015-01-19: 1 [IU] via SUBCUTANEOUS
  Administered 2015-01-19: 3 [IU] via SUBCUTANEOUS
  Administered 2015-01-20: 1 [IU] via SUBCUTANEOUS
  Filled 2015-01-17: qty 2
  Filled 2015-01-17 (×2): qty 1
  Filled 2015-01-17 (×2): qty 2
  Filled 2015-01-17: qty 3
  Filled 2015-01-17: qty 1

## 2015-01-17 MED ORDER — TRAZODONE HCL 50 MG PO TABS
150.0000 mg | ORAL_TABLET | Freq: Every day | ORAL | Status: DC
  Administered 2015-01-17: 150 mg via ORAL
  Administered 2015-01-18: 50 mg via ORAL
  Administered 2015-01-19: 150 mg via ORAL
  Filled 2015-01-17 (×3): qty 1

## 2015-01-17 MED ORDER — PIPERACILLIN-TAZOBACTAM 3.375 G IVPB
INTRAVENOUS | Status: AC
  Administered 2015-01-17: 3.375 g via INTRAVENOUS
  Filled 2015-01-17: qty 50

## 2015-01-17 MED ORDER — IOHEXOL 350 MG/ML SOLN
80.0000 mL | Freq: Once | INTRAVENOUS | Status: AC | PRN
  Administered 2015-01-17: 80 mL via INTRAVENOUS

## 2015-01-17 MED ORDER — VANCOMYCIN HCL IN DEXTROSE 1-5 GM/200ML-% IV SOLN
1000.0000 mg | Freq: Once | INTRAVENOUS | Status: AC
  Administered 2015-01-17: 1000 mg via INTRAVENOUS

## 2015-01-17 NOTE — Consult Note (Signed)
Called in regards to DM pt stepped on staple and cellulitis to leg with concern for gas on CT.  Reviewed CT and showed very superficial area of emphysema around 1st mtpj. No extension to bone.  Recommend NPO and will likely need I & D of likely abscess.

## 2015-01-17 NOTE — ED Provider Notes (Signed)
[facility] Emergency Department Provider Note    ____________________________________________  Time seen: 1715  I have reviewed the triage vital signs and the nursing notes.   HISTORY  Chief Complaint Leg Swelling   History limited by: Not Limited   HPI [name] is a 59 y.o. male who presents to the emergency department with left foot and leg pain. Patient states he stepped on a staple last week. He was wearing shoes at the time.The staple went through the shoe. The patient kept on wearing the shoes for 2 days before he realized what happened. He has been trying to treat this at his home. Additionally he went to his primary care doctor and was placed on Levaquin. It has progressively gotten worse. The pain has now progressed up his shin. He does state that he has had some fevers.      Past Medical History  Diagnosis Date  . COPD (chronic obstructive pulmonary disease)   . Hyperlipidemia   . Hypertension   . Borderline DM     a. Prev on meds- taken off.  . Peripheral neuropathy   . Tobacco abuse     a. 46 yrs, up to 2 ppd, changed to e-cigarette 05/2013.  . Cardiomyopathy     a. 10/2013 Echo: EF 35-40%, mild MR/TR;  b. 11/2013 Lexi MV: EF 46%, fixed apical defect with small distal antsept ischemia->overall low-risk->med Rx.  . Secondary erythrocytosis     a. followed by heme-onc with periodic phlebotomy.  . CKD (chronic kidney disease), stage III     a. Creat 1.42 (10/2013).  . Ischemic toe     a. 10/2013 L fifth toe - felt to be either 2/2 embolic plaque vs erythrocytosis - seen by vascular surgery (Dew) ->conservative rx.  Marland Kitchen LBBB (left bundle branch block)     Patient Active Problem List   Diagnosis Date Noted  . CAD (coronary artery disease) 01/07/2014  . SOB (shortness of breath) 11/16/2013  . Chest pain 11/16/2013  . Morbid obesity 11/16/2013  . Hyperlipidemia 11/16/2013  . Diabetes mellitus, type 2 11/16/2013  . Smoker 11/16/2013   . Left bundle branch block 11/16/2013  . Ischemic toe 11/16/2013  . Congestive dilated cardiomyopathy 11/16/2013    History reviewed. No pertinent past surgical history.  Current Outpatient Rx  Name  Route  Sig  Dispense  Refill  . aspirin 81 MG tablet   Oral   Take 81 mg by mouth daily.         . clopidogrel (PLAVIX) 75 MG tablet   Oral   Take 75 mg by mouth daily with breakfast.         . diazepam (VALIUM) 2 MG tablet   Oral   Take 1 tablet (2 mg total) by mouth every 8 (eight) hours as needed for muscle spasms.   15 tablet   0   . isosorbide mononitrate (IMDUR) 30 MG 24 hr tablet   Oral   Take 0.5 tablets (15 mg total) by mouth daily.   90 tablet   3   . lisinopril (PRINIVIL,ZESTRIL) 20 MG tablet   Oral   Take 10 mg by mouth daily.         Marland Kitchen lovastatin (MEVACOR) 40 MG tablet   Oral   Take 40 mg by mouth at bedtime.         . metoprolol tartrate (LOPRESSOR) 25 MG tablet   Oral   Take 1 tablet (25 mg total) by mouth 2 (  two) times daily.   180 tablet   3   . nitroGLYCERIN (NITROSTAT) 0.4 MG SL tablet   Sublingual   Place 1 tablet (0.4 mg total) under the tongue every 5 (five) minutes as needed for chest pain.   25 tablet   3   . oxyCODONE (OXY IR/ROXICODONE) 5 MG immediate release tablet   Oral   Take 5 mg by mouth every 4 (four) hours as needed for severe pain.         . traZODone (DESYREL) 150 MG tablet      TAKE 1 TABLET BY MOUTH AT BEDTIME.   30 tablet   0     Per Dr. Mariah Milling the patient can only have 30 tablet ...     Allergies Review of patient's allergies indicates no known allergies.  Family History  Problem Relation Age of Onset  . Hypertension Mother     alive @ 34  . Hyperlipidemia Mother   . Hypertension Brother   . Hyperlipidemia Brother   . Hypertension Maternal Aunt   . Hypertension Brother   . Hyperlipidemia Brother   . Hypertension Brother   . Hyperlipidemia Brother   . Diabetes Brother     Social  History History  Substance Use Topics  . Smoking status: Current Every Day Smoker -- 2.00 packs/day for 46 years    Types: Cigarettes, E-cigarettes  . Smokeless tobacco: Not on file     Comment: Currently smoking E cigarettes.  . Alcohol Use: No     Comment: alcohol abuse in the past.     Review of Systems  Constitutional: Positive for fever. Cardiovascular: Negative for chest pain. Respiratory: Negative for shortness of breath. Gastrointestinal: Negative for abdominal pain, vomiting and diarrhea. Genitourinary: Negative for dysuria. Musculoskeletal: Negative for back pain. Left lower extremity swelling and pain. Skin: Redness to left lower extremity Neurological: Negative for headaches, focal weakness or numbness.   10-point ROS otherwise negative.  ____________________________________________   PHYSICAL EXAM:  VITAL SIGNS: ED Triage Vitals  Enc Vitals Group     BP 01/17/15 1519 93/62 mmHg     Pulse Rate 01/17/15 1519 87     Resp 01/17/15 1519 20     Temp 01/17/15 1519 98 F (36.7 C)     Temp Source 01/17/15 1519 Oral     SpO2 01/17/15 1519 95 %     Weight 01/17/15 1519 341 lb (154.677 kg)     Height 01/17/15 1519 6' (1.829 m)     Head Cir --      Peak Flow --      Pain Score 01/17/15 1520 9   Constitutional: Alert and oriented. Well appearing and in no distress. Eyes: Conjunctivae are normal. PERRL. Normal extraocular movements. ENT   Head: Normocephalic and atraumatic.   Nose: No congestion/rhinnorhea.   Mouth/Throat: Mucous membranes are moist.   Neck: No stridor. Hematological/Lymphatic/Immunilogical: No cervical lymphadenopathy. Cardiovascular: Normal rate, regular rhythm.  No murmurs, rubs, or gallops. Respiratory: Normal respiratory effort without tachypnea nor retractions. Breath sounds are clear and equal bilaterally. No wheezes/rales/rhonchi. Gastrointestinal: Soft and nontender. No distention.  Genitourinary: Deferred Musculoskeletal:  Left lower extremity with swelling and edema from mid shin through foot. Patient does have a large area of erythema and swelling with some watery discharge underneath his great toe. Some black skin on the dorsal aspect around the base of the great toe. Neurologic:  Normal speech and language. No gross focal neurologic deficits are appreciated. Speech is normal.  Skin:  Skin is warm, dry and intact. No rash noted. Psychiatric: Mood and affect are normal. Speech and behavior are normal. Patient exhibits appropriate insight and judgment.  ____________________________________________    LABS (pertinent positives/negatives)  Labs Reviewed  CBC WITH DIFFERENTIAL/PLATELET - Abnormal; Notable for the following:    WBC 12.8 (*)    Hemoglobin 12.5 (*)    MCV 72.4 (*)    MCH 22.4 (*)    MCHC 31.0 (*)    RDW 19.5 (*)    Neutro Abs 10.1 (*)    All other components within normal limits  BASIC METABOLIC PANEL - Abnormal; Notable for the following:    Sodium 131 (*)    Chloride 99 (*)    Glucose, Bld 292 (*)    BUN 24 (*)    Creatinine, Ser 1.76 (*)    Calcium 8.4 (*)    GFR calc non Af Amer 41 (*)    GFR calc Af Amer 48 (*)    All other components within normal limits  CULTURE, BLOOD (ROUTINE X 2)  CULTURE, BLOOD (ROUTINE X 2)  LACTIC ACID, PLASMA  LACTIC ACID, PLASMA     ____________________________________________   EKG  None  ____________________________________________    RADIOLOGY  None  ____________________________________________   PROCEDURES  Procedure(s) performed: None  Critical Care performed: No  ____________________________________________   INITIAL IMPRESSION / ASSESSMENT AND PLAN / ED COURSE  Pertinent labs & imaging results that were available during my care of the patient were reviewed by me and considered in my medical decision making (see chart for details).  She presents to the emergency department with concerns for infection of the left foot.  Has been on Levaquin with worsening symptoms. On exam patient with grossly infected the left lower extremity. To have her concern for some necrotic skin about the base of his great toe. We will get a CT scan to evaluate for any gas and start on empiric IV antibiotics.  ----------------------------------------- 8:37 PM on 01/17/2015 -----------------------------------------  CT scan does show an abscess base of the first metatarsal with gas. I have talked to Dr. Ether Griffins with podiatry who will take a look CT scan. In the meantime we will admit to medicine for continued IV antibiotics.  ____________________________________________   FINAL CLINICAL IMPRESSION(S) / ED DIAGNOSES  Final diagnoses:  Infection  Abscess     Phineas Semen, MD 01/17/15 2038

## 2015-01-17 NOTE — Consult Note (Signed)
ORTHOPAEDIC CONSULTATION  REQUESTING PHYSICIAN: Oralia Manis, MD  Chief Complaint: Left foot infection  HPI: [name] is a 59 y.o. male who complains of  left foot infection. He stepped on a staple and it was in his foot for couple of days. This happened several days ago. Seen by his primary care physician and is on Levaquin. Has had worsening redness and swelling. Primary care physician referred him to the ER today for diabetic foot infection. Upon admission a CT scan was ordered and it showed concern for gas in the soft tissues. No signs of osteomyelitis. Consulted [place] for further evaluation and treatment as needed.  Past Medical History  Diagnosis Date  . COPD (chronic obstructive pulmonary disease)   . Hyperlipidemia   . Hypertension   . Type II diabetes mellitus   . Peripheral neuropathy   . Tobacco abuse     a. 46 yrs, up to 2 ppd, changed to e-cigarette 05/2013.  . Cardiomyopathy     a. 10/2013 Echo: EF 35-40%, mild MR/TR;  b. 11/2013 Lexi MV: EF 46%, fixed apical defect with small distal antsept ischemia->overall low-risk->med Rx.  . Secondary erythrocytosis     a. followed by heme-onc with periodic phlebotomy.  . CKD (chronic kidney disease), stage III     a. Creat 1.42 (10/2013).  . Ischemic toe     a. 10/2013 L fifth toe - felt to be either 2/2 embolic plaque vs erythrocytosis - seen by vascular surgery (Dew) ->conservative rx.  Marland Kitchen LBBB (left bundle branch block)   . Collagen vascular disease   . CHF (congestive heart failure)    Past Surgical History  Procedure Laterality Date  . Orif tibia fracture     History   Social History  . Marital Status: Significant Other    Spouse Name: N/A  . Number of Children: N/A  . Years of Education: N/A   Social History Main Topics  . Smoking status: Current Every Day Smoker -- 2.00 packs/day for 46 years    Types: Cigarettes, E-cigarettes  . Smokeless tobacco: Not on file     Comment: Currently smoking E cigarettes.  .  Alcohol Use: No     Comment: alcohol abuse in the past.   . Drug Use: No  . Sexual Activity: Not on file   Other Topics Concern  . None   Social History Narrative   He lives in [place] with his dtr and son-in-law.  On disability.  Does not routinely exercise.  Eats 6 scoops of banana ice cream every night.   Family History  Problem Relation Age of Onset  . Hypertension Mother     alive @ 47  . Hyperlipidemia Mother   . Hypertension Brother   . Hyperlipidemia Brother   . Hypertension Maternal Aunt   . Hypertension Brother   . Hyperlipidemia Brother   . Hypertension Brother   . Hyperlipidemia Brother   . Diabetes Brother    No Known Allergies Prior to Admission medications   Medication Sig Start Date End Date Taking? Authorizing Provider  albuterol (PROVENTIL HFA;VENTOLIN HFA) 108 (90 BASE) MCG/ACT inhaler Inhale 2 puffs into the lungs every 4 (four) hours as needed for wheezing or shortness of breath.   Yes Historical Provider, MD  aspirin 81 MG tablet Take 81 mg by mouth daily.   Yes Historical Provider, MD  B Complex-C (B-COMPLEX WITH VITAMIN C) tablet Take 1 tablet by mouth daily.   Yes Historical Provider, MD  Cholecalciferol (VITAMIN D3)  50000 UNITS CAPS Take 1 capsule by mouth every 30 (thirty) days.   Yes Historical Provider, MD  clopidogrel (PLAVIX) 75 MG tablet Take 75 mg by mouth daily with breakfast.   Yes Historical Provider, MD  isosorbide mononitrate (IMDUR) 30 MG 24 hr tablet Take 0.5 tablets (15 mg total) by mouth daily. 12/28/13  Yes Ok Anis, NP  linagliptin (TRADJENTA) 5 MG TABS tablet Take 5 mg by mouth daily.   Yes Historical Provider, MD  lisinopril (PRINIVIL,ZESTRIL) 20 MG tablet Take 10 mg by mouth daily.   Yes Historical Provider, MD  loratadine (CLARITIN) 10 MG tablet Take 10 mg by mouth daily.   Yes Historical Provider, MD  lovastatin (MEVACOR) 40 MG tablet Take 40 mg by mouth at bedtime.   Yes Historical Provider, MD  metoprolol tartrate  (LOPRESSOR) 25 MG tablet Take 1 tablet (25 mg total) by mouth 2 (two) times daily. 09/01/14  Yes Antonieta Iba, MD  nitroGLYCERIN (NITROSTAT) 0.4 MG SL tablet Place 1 tablet (0.4 mg total) under the tongue every 5 (five) minutes as needed for chest pain. 12/28/13  Yes Ok Anis, NP  pregabalin (LYRICA) 100 MG capsule Take 100 mg by mouth 3 (three) times daily.   Yes Historical Provider, MD  sertraline (ZOLOFT) 50 MG tablet Take 50 mg by mouth daily.  12/30/14  Yes Historical Provider, MD  traZODone (DESYREL) 150 MG tablet TAKE 1 TABLET BY MOUTH AT BEDTIME. 09/13/14  Yes Antonieta Iba, MD  diazepam (VALIUM) 2 MG tablet Take 1 tablet (2 mg total) by mouth every 8 (eight) hours as needed for muscle spasms. 01/09/15 01/09/16  Darien Ramus, MD  oxyCODONE (OXY IR/ROXICODONE) 5 MG immediate release tablet Take 5 mg by mouth every 4 (four) hours as needed for severe pain.    Historical Provider, MD   Ct Tibia Fibula Left W Contrast  01/17/2015   CLINICAL DATA:  Stepped on staple few days ago, with swelling and erythema at the left lower leg. Initial encounter.  EXAM: CT OF THE LOWER LEFT EXTREMITY WITH CONTRAST  TECHNIQUE: Multidetector CT imaging of the left knee, leg and foot was performed according to the standard protocol following intravenous contrast administration.  COMPARISON:  Left foot radiographs performed 10/06/2011, Doppler venous ultrasound of the left lower extremity performed 12/26/2008, and left knee CT performed 03/10/2007  CONTRAST:  80mL OMNIPAQUE IOHEXOL 350 MG/ML SOLN  FINDINGS: There appears to be a focal 2.6 x 2.4 x 1.7 cm abscess containing fluid and air plantar to the medial aspect of the first metatarsophalangeal joint. There is suggestion of minimal extension of air more superiorly, with soft tissue edema extending about the first metatarsophalangeal joint. An overlying soft tissue laceration is seen. The appearance of the air within the abscess raises question for a  gas-producing organism, though no definite diffuse necrotizing fasciitis is yet seen.  Mild diffuse soft tissue edema extends about the foot, ankle and lower leg, up to the level of the knee. Soft tissue edema is more prominent laterally and anteriorly. No additional abscess or soft tissue air is identified. No significant knee joint effusion is seen.  The visualized vasculature is unremarkable in appearance. The visualized flexor and extensor tendons are grossly unremarkable. The Achilles tendon remains intact. The peroneal tendons are unremarkable. The visualized musculature is unremarkable. There is no evidence of inflammation deep to the fascia layers.  A plate and screws are noted along the proximal tibia. There is chronic degenerative change at the  tibiofibular articulation. An os naviculare is noted. There is no evidence of fracture or dislocation. No definite osseous erosion is seen to suggest osteomyelitis at this time.  IMPRESSION: 1. Focal 2.6 x 2 4 x 1.7 cm abscess containing fluid and air from a plantar to the medial aspect of the first metatarsophalangeal joint. Suggestion of minimal extension of air more superiorly, with soft tissue edema extending about the first metatarsophalangeal joint. Overlying soft tissue laceration seen. The appearance of the air within the abscess raises concern for a gas-producing organism, though no definite diffuse necrotizing fasciitis is yet seen. 2. Mild diffuse soft tissue edema extends about the foot, ankle and lower leg, to the level of the knee. Soft tissue edema is more prominent laterally and anteriorly. No additional abscess or soft tissue air seen. 3. No osseous erosions seen.  These results were called by telephone at the time of interpretation on 01/17/2015 at 8:18 pm to Dr. Phineas Semen , who verbally acknowledged these results.   Electronically Signed   By: Roanna Raider M.D.   On: 01/17/2015 20:20    Positive ROS: All other systems have been reviewed  and were otherwise negative with the exception of those mentioned in the HPI and as above.  12 point ROS was performed.  Physical Exam: General: Alert and oriented.  No apparent distress.  Vascular:  Left foot:Dorsalis Pedis:  present Posterior Tibial:  present  Right foot: Dorsalis Pedis:  present Posterior Tibial:  present  Neuro: Protective sensation is  absent  Derm:Patient with noted severe cellulitis to the left lower leg. A large bullous abscess on the plantar aspect of the left first MTPJ. Was able to remove this and there was a noted deep ulceration with fibrotic tissue. Purulent Drainage from this region as well.  Ortho/MS: Diffuse edema to the left leg compared to the right side. Some pain with palpation around the left forefoot. Numbness from the mid foot proximal.   CBC Latest Ref Rng 01/17/2015 01/09/2015 12/27/2014  WBC 3.8 - 10.6 K/uL 12.8(H) 14.8(H) 10.8(H)  Hemoglobin 13.0 - 18.0 g/dL 12.5(L) 14.6 13.2  Hematocrit 40.0 - 52.0 % 40.3 48.0 42.5  Platelets 150 - 440 K/uL 290 192 155         Assessment: Left foot abscess with history of diabetes and neuropathy  Plan: Performed a superficial debridement of the blistered area today in the bedside. Was able to remove the femoral superficial clear fluid. I superficially debrided the central aspect of the ulceration with 15 blade to subcutaneous tissue. The area and there was noted to be purulent drainage. I applied a bandaged. He will be admitted to the floor under medicine. Nothing by mouth after breakfast tomorrow morning. He can have a liquid diet after midnight tonight. We'll order an MRI to evaluate for abscess in the midfoot. We'll plan for surgical debridement in the OR tomorrow. I explained this to the patient and family today and verbal consent was given.   Irean Hong, DPM Cell [phone]   01/17/2015 10:17 PM

## 2015-01-17 NOTE — ED Notes (Signed)
Pt reports that he stepped on a staple a few days ago, he took out the staple but leg has gotten more swollen and redness on left lower leg

## 2015-01-17 NOTE — H&P (Signed)
[facility] - [place] at [facility]   PATIENT NAME: [name]    MR#:  [id number]  DATE OF BIRTH:  05-17-1956  DATE OF ADMISSION:  01/17/2015  PRIMARY CARE PHYSICIAN: No PCP Per Patient   REQUESTING/REFERRING PHYSICIAN: Derrill Kay  CHIEF COMPLAINT:   Chief Complaint  Patient presents with  . Leg Swelling    HISTORY OF PRESENT ILLNESS:  [name]  is a 59 y.o. male who presents with significant left lower extremity cellulitis and foot abscess. Patient is a known diabetic with peripheral neuropathy, who states that he stepped on a staple about 10 days ago. He developed erythema at the site, and went to see his outpatient physician who gave him a tetanus shot and prescribed him oral antibiotics (Levaquin). His foot was continuing to worsen on the Levaquin so he return to the physician who did not change his treatment at that time. One day ago he noticed that the erythema was spreading superiorly up that leg, and that he had a significant superficial white pus pocket developing over the ball of his foot. He states that he had had some intermittent chills at home. He came to the ED for evaluation. Here he was noted to have an elevated white blood cell count at 12.8. CT scan of the foot showed significant tissue edema and inflammation corresponding to his cellulitis, as well as the abscess with a small amount of air present, raising suspicion for gas-forming bacteria though no overt necrotizing fasciitis was seen at this time. Podiatry was counseled by ED staff and said that they would take him to the OR tomorrow for debridement. IV antibiotics were given, and hospitalists were called for admission.  PAST MEDICAL HISTORY:   Past Medical History  Diagnosis Date  . COPD (chronic obstructive pulmonary disease)   . Hyperlipidemia   . Hypertension   . Type II diabetes mellitus   . Peripheral neuropathy   . Tobacco abuse     a. 46 yrs, up to 2 ppd, changed to  e-cigarette 05/2013.  . Cardiomyopathy     a. 10/2013 Echo: EF 35-40%, mild MR/TR;  b. 11/2013 Lexi MV: EF 46%, fixed apical defect with small distal antsept ischemia->overall low-risk->med Rx.  . Secondary erythrocytosis     a. followed by heme-onc with periodic phlebotomy.  . CKD (chronic kidney disease), stage III     a. Creat 1.42 (10/2013).  . Ischemic toe     a. 10/2013 L fifth toe - felt to be either 2/2 embolic plaque vs erythrocytosis - seen by vascular surgery (Dew) ->conservative rx.  Marland Kitchen LBBB (left bundle branch block)   . Collagen vascular disease   . CHF (congestive heart failure)     PAST SURGICAL HISTORY:   Past Surgical History  Procedure Laterality Date  . Orif tibia fracture      SOCIAL HISTORY:   History  Substance Use Topics  . Smoking status: Current Every Day Smoker -- 2.00 packs/day for 46 years    Types: Cigarettes, E-cigarettes  . Smokeless tobacco: Not on file     Comment: Currently smoking E cigarettes.  . Alcohol Use: No     Comment: alcohol abuse in the past.     FAMILY HISTORY:   Family History  Problem Relation Age of Onset  . Hypertension Mother     alive @ 21  . Hyperlipidemia Mother   . Hypertension Brother   . Hyperlipidemia Brother   . Hypertension Maternal Aunt   .  Hypertension Brother   . Hyperlipidemia Brother   . Hypertension Brother   . Hyperlipidemia Brother   . Diabetes Brother     DRUG ALLERGIES:  No Known Allergies  MEDICATIONS AT HOME:   Prior to Admission medications   Medication Sig Start Date End Date Taking? Authorizing Provider  albuterol (PROVENTIL HFA;VENTOLIN HFA) 108 (90 BASE) MCG/ACT inhaler Inhale 2 puffs into the lungs every 4 (four) hours as needed for wheezing or shortness of breath.   Yes Historical Provider, MD  aspirin 81 MG tablet Take 81 mg by mouth daily.   Yes Historical Provider, MD  B Complex-C (B-COMPLEX WITH VITAMIN C) tablet Take 1 tablet by mouth daily.   Yes Historical Provider, MD   Cholecalciferol (VITAMIN D3) 50000 UNITS CAPS Take 1 capsule by mouth every 30 (thirty) days.   Yes Historical Provider, MD  clopidogrel (PLAVIX) 75 MG tablet Take 75 mg by mouth daily with breakfast.   Yes Historical Provider, MD  isosorbide mononitrate (IMDUR) 30 MG 24 hr tablet Take 0.5 tablets (15 mg total) by mouth daily. 12/28/13  Yes Ok Anis, NP  linagliptin (TRADJENTA) 5 MG TABS tablet Take 5 mg by mouth daily.   Yes Historical Provider, MD  lisinopril (PRINIVIL,ZESTRIL) 20 MG tablet Take 10 mg by mouth daily.   Yes Historical Provider, MD  loratadine (CLARITIN) 10 MG tablet Take 10 mg by mouth daily.   Yes Historical Provider, MD  lovastatin (MEVACOR) 40 MG tablet Take 40 mg by mouth at bedtime.   Yes Historical Provider, MD  metoprolol tartrate (LOPRESSOR) 25 MG tablet Take 1 tablet (25 mg total) by mouth 2 (two) times daily. 09/01/14  Yes Antonieta Iba, MD  nitroGLYCERIN (NITROSTAT) 0.4 MG SL tablet Place 1 tablet (0.4 mg total) under the tongue every 5 (five) minutes as needed for chest pain. 12/28/13  Yes Ok Anis, NP  pregabalin (LYRICA) 100 MG capsule Take 100 mg by mouth 3 (three) times daily.   Yes Historical Provider, MD  sertraline (ZOLOFT) 50 MG tablet Take 50 mg by mouth daily.  12/30/14  Yes Historical Provider, MD  traZODone (DESYREL) 150 MG tablet TAKE 1 TABLET BY MOUTH AT BEDTIME. 09/13/14  Yes Antonieta Iba, MD  diazepam (VALIUM) 2 MG tablet Take 1 tablet (2 mg total) by mouth every 8 (eight) hours as needed for muscle spasms. 01/09/15 01/09/16  Darien Ramus, MD  oxyCODONE (OXY IR/ROXICODONE) 5 MG immediate release tablet Take 5 mg by mouth every 4 (four) hours as needed for severe pain.    Historical Provider, MD    REVIEW OF SYSTEMS:  Review of Systems  Constitutional: Positive for chills and malaise/fatigue. Negative for fever and weight loss.  HENT: Negative for ear pain, hearing loss and tinnitus.   Eyes: Negative for blurred vision, double  vision, pain and redness.  Respiratory: Negative for cough, hemoptysis and shortness of breath.   Cardiovascular: Negative for chest pain, palpitations, orthopnea and leg swelling.  Gastrointestinal: Positive for nausea. Negative for vomiting, abdominal pain, diarrhea and constipation.  Genitourinary: Negative for dysuria, frequency and hematuria.  Musculoskeletal: Positive for joint pain (left foot corresponding to the cellulitis and abscess). Negative for back pain and neck pain.  Skin:       Left large tremor any cellulitis with foot abscess, No acne or other rash  Neurological: Negative for dizziness, tremors, focal weakness and weakness.  Endo/Heme/Allergies: Negative for polydipsia. Does not bruise/bleed easily.  Psychiatric/Behavioral: Negative for depression. The patient  is not nervous/anxious and does not have insomnia.      VITAL SIGNS:   Filed Vitals:   01/17/15 1720 01/17/15 1721 01/17/15 1730 01/17/15 1800  BP: 117/73  115/78 110/73  Pulse:  80 80 78  Temp:      TempSrc:      Resp:      Height:      Weight:      SpO2:  93% 95% 92%   Wt Readings from Last 3 Encounters:  01/17/15 154.677 kg (341 lb)  01/09/15 154.677 kg (341 lb)  12/27/14 157.852 kg (348 lb)    PHYSICAL EXAMINATION:  Physical Exam  Constitutional: He is oriented to person, place, and time. He appears well-developed and well-nourished. No distress.  HENT:  Head: Normocephalic and atraumatic.  Mouth/Throat: Oropharynx is clear and moist.  Eyes: Conjunctivae and EOM are normal. Pupils are equal, round, and reactive to light. No scleral icterus.  Neck: Normal range of motion. Neck supple. No JVD present. No thyromegaly present.  Cardiovascular: Normal rate, regular rhythm and intact distal pulses.  Exam reveals no gallop and no friction rub.   No murmur heard. Respiratory: Effort normal and breath sounds normal. No respiratory distress. He has no wheezes. He has no rales.  GI: Soft. Bowel sounds are  normal. He exhibits no distension. There is no tenderness.  Musculoskeletal: Normal range of motion. He exhibits no edema.  No arthritis, no gout  Lymphadenopathy:    He has no cervical adenopathy.  Neurological: He is alert and oriented to person, place, and time. No cranial nerve deficit.  No dysarthria, no aphasia  Skin: Skin is warm and dry. No rash noted. There is erythema (tracking up from the top of his foot to three quarters the way up his shin).  Left foot abscess superficial over the ball of the foot with expressible pus from a very small central opening.  Psychiatric: He has a normal mood and affect. His behavior is normal. Judgment and thought content normal.    LABORATORY PANEL:   CBC  Recent Labs Lab 01/17/15 1527  WBC 12.8*  HGB 12.5*  HCT 40.3  PLT 290   ------------------------------------------------------------------------------------------------------------------  Chemistries   Recent Labs Lab 01/17/15 1527  NA 131*  K 4.7  CL 99*  CO2 27  GLUCOSE 292*  BUN 24*  CREATININE 1.76*  CALCIUM 8.4*   ------------------------------------------------------------------------------------------------------------------  Cardiac Enzymes No results for input(s): TROPONINI in the last 168 hours. ------------------------------------------------------------------------------------------------------------------  RADIOLOGY:  Ct Tibia Fibula Left W Contrast  01/17/2015   CLINICAL DATA:  Stepped on staple few days ago, with swelling and erythema at the left lower leg. Initial encounter.  EXAM: CT OF THE LOWER LEFT EXTREMITY WITH CONTRAST  TECHNIQUE: Multidetector CT imaging of the left knee, leg and foot was performed according to the standard protocol following intravenous contrast administration.  COMPARISON:  Left foot radiographs performed 10/06/2011, Doppler venous ultrasound of the left lower extremity performed 12/26/2008, and left knee CT performed 03/10/2007   CONTRAST:  80mL OMNIPAQUE IOHEXOL 350 MG/ML SOLN  FINDINGS: There appears to be a focal 2.6 x 2.4 x 1.7 cm abscess containing fluid and air plantar to the medial aspect of the first metatarsophalangeal joint. There is suggestion of minimal extension of air more superiorly, with soft tissue edema extending about the first metatarsophalangeal joint. An overlying soft tissue laceration is seen. The appearance of the air within the abscess raises question for a gas-producing organism, though no definite diffuse necrotizing fasciitis  is yet seen.  Mild diffuse soft tissue edema extends about the foot, ankle and lower leg, up to the level of the knee. Soft tissue edema is more prominent laterally and anteriorly. No additional abscess or soft tissue air is identified. No significant knee joint effusion is seen.  The visualized vasculature is unremarkable in appearance. The visualized flexor and extensor tendons are grossly unremarkable. The Achilles tendon remains intact. The peroneal tendons are unremarkable. The visualized musculature is unremarkable. There is no evidence of inflammation deep to the fascia layers.  A plate and screws are noted along the proximal tibia. There is chronic degenerative change at the tibiofibular articulation. An os naviculare is noted. There is no evidence of fracture or dislocation. No definite osseous erosion is seen to suggest osteomyelitis at this time.  IMPRESSION: 1. Focal 2.6 x 2 4 x 1.7 cm abscess containing fluid and air from a plantar to the medial aspect of the first metatarsophalangeal joint. Suggestion of minimal extension of air more superiorly, with soft tissue edema extending about the first metatarsophalangeal joint. Overlying soft tissue laceration seen. The appearance of the air within the abscess raises concern for a gas-producing organism, though no definite diffuse necrotizing fasciitis is yet seen. 2. Mild diffuse soft tissue edema extends about the foot, ankle and  lower leg, to the level of the knee. Soft tissue edema is more prominent laterally and anteriorly. No additional abscess or soft tissue air seen. 3. No osseous erosions seen.  These results were called by telephone at the time of interpretation on 01/17/2015 at 8:18 pm to Dr. Phineas Semen , who verbally acknowledged these results.   Electronically Signed   By: Roanna Raider M.D.   On: 01/17/2015 20:20    EKG:   Orders placed or performed during the hospital encounter of 01/09/15  . ED EKG  . ED EKG  . EKG    IMPRESSION AND PLAN:  Principal Problem:   Cellulitis and abscess of foot - IV antibiotics given in the ED (vancomycin, Zosyn, clindamycin). Podiatry contacted, to take patient to the OR tomorrow for incision and drainage with debridement. We'll continue antibiotics tonight and make him nothing by mouth for his procedure after midnight. Active Problems:   Diabetes mellitus, type 2 - on should gentle at home, hold this while here, sliding scale insulin before meals at bedtime while eating before midnight, then every 6 hours while nothing by mouth. Carb modified diet before and after procedure.   CAD (coronary artery disease) - continue home meds, patient is on appropriate medical management for this.   HTN (hypertension) - stable at this time, continue home meds   CKD (chronic kidney disease), stage III - avoid nephrotoxins, monitor serum creatinine   Chronic combined systolic and diastolic CHF (congestive heart failure) - continue home medications, daily weights.   Hyperlipidemia - home meds   Peripheral neuropathy - continue home dose Lyrica.  All the records are reviewed and case discussed with ED provider. Management plans discussed with the patient and/or family.  DVT PROPHYLAXIS: SubQ heparin  ADMISSION STATUS: Inpatient  CODE STATUS: Full  TOTAL TIME TAKING CARE OF THIS PATIENT: 55 minutes.    Diona Peregoy FIELDING 01/17/2015, 9:15 PM  [employer] [facility]   Office  [phone]  CC: Primary care physician; No PCP Per Patient

## 2015-01-18 ENCOUNTER — Encounter
Admission: EM | Disposition: A | Discharge status: Home / Self Care | Payer: Self-pay | Source: Home / Self Care | Attending: Internal Medicine

## 2015-01-18 ENCOUNTER — Inpatient Hospital Stay: Payer: Medicare HMO | Admitting: Anesthesiology

## 2015-01-18 ENCOUNTER — Inpatient Hospital Stay: Payer: Medicare HMO

## 2015-01-18 HISTORY — PX: I & D EXTREMITY: SHX5045

## 2015-01-18 LAB — PROTIME-INR
INR: 1.24
Prothrombin Time: 15.8 seconds — ABNORMAL HIGH (ref 11.4–15.0)

## 2015-01-18 LAB — COMPREHENSIVE METABOLIC PANEL
ALT: 22 U/L (ref 17–63)
AST: 30 U/L (ref 15–41)
Albumin: 2.3 g/dL — ABNORMAL LOW (ref 3.5–5.0)
Alkaline Phosphatase: 135 U/L — ABNORMAL HIGH (ref 38–126)
Anion gap: 7 (ref 5–15)
BUN: 23 mg/dL — ABNORMAL HIGH (ref 6–20)
CHLORIDE: 101 mmol/L (ref 101–111)
CO2: 25 mmol/L (ref 22–32)
Calcium: 8 mg/dL — ABNORMAL LOW (ref 8.9–10.3)
Creatinine, Ser: 1.63 mg/dL — ABNORMAL HIGH (ref 0.61–1.24)
GFR calc non Af Amer: 45 mL/min — ABNORMAL LOW (ref >60)
GFR, EST AFRICAN AMERICAN: 52 mL/min — AB (ref >60)
Glucose, Bld: 139 mg/dL — ABNORMAL HIGH (ref 65–99)
Potassium: 4.2 mmol/L (ref 3.5–5.1)
SODIUM: 133 mmol/L — AB (ref 135–145)
Total Bilirubin: 0.7 mg/dL (ref 0.3–1.2)
Total Protein: 7.7 g/dL (ref 6.5–8.1)

## 2015-01-18 LAB — CBC
HCT: 39.6 % — ABNORMAL LOW (ref 40.0–52.0)
HEMOGLOBIN: 12.6 g/dL — AB (ref 13.0–18.0)
MCH: 23.3 pg — AB (ref 26.0–34.0)
MCHC: 31.7 g/dL — AB (ref 32.0–36.0)
MCV: 73.3 fL — ABNORMAL LOW (ref 80.0–100.0)
Platelets: 271 10*3/uL (ref 150–440)
RBC: 5.41 MIL/uL (ref 4.40–5.90)
RDW: 20.3 % — AB (ref 11.5–14.5)
WBC: 12.3 10*3/uL — AB (ref 3.8–10.6)

## 2015-01-18 LAB — LACTIC ACID, PLASMA: Lactic Acid, Venous: 0.7 mmol/L (ref 0.5–2.0)

## 2015-01-18 LAB — GLUCOSE, CAPILLARY
GLUCOSE-CAPILLARY: 145 mg/dL — AB (ref 65–99)
GLUCOSE-CAPILLARY: 183 mg/dL — AB (ref 65–99)
GLUCOSE-CAPILLARY: 135 mg/dL — AB (ref 65–99)

## 2015-01-18 SURGERY — IRRIGATION AND DEBRIDEMENT EXTREMITY
Anesthesia: General | Site: Foot | Laterality: Left | Wound class: Dirty or Infected

## 2015-01-18 MED ORDER — LIDOCAINE HCL (PF) 1 % IJ SOLN
INTRAMUSCULAR | Status: AC
  Filled 2015-01-18: qty 30

## 2015-01-18 MED ORDER — HYDROCODONE-ACETAMINOPHEN 5-325 MG PO TABS
1.0000 | ORAL_TABLET | Freq: Four times a day (QID) | ORAL | Status: DC | PRN
  Administered 2015-01-18 – 2015-01-19 (×2): 1 via ORAL
  Filled 2015-01-18 (×2): qty 1

## 2015-01-18 MED ORDER — PROPOFOL 10 MG/ML IV BOLUS
INTRAVENOUS | Status: DC | PRN
  Administered 2015-01-18: 200 mg via INTRAVENOUS

## 2015-01-18 MED ORDER — BUPIVACAINE HCL 0.5 % IJ SOLN
INTRAMUSCULAR | Status: DC | PRN
  Administered 2015-01-18: 10 mL

## 2015-01-18 MED ORDER — SODIUM CHLORIDE 0.9 % IV SOLN
INTRAVENOUS | Status: DC | PRN
  Administered 2015-01-18: 16:00:00 via INTRAVENOUS

## 2015-01-18 MED ORDER — PIPERACILLIN-TAZOBACTAM 3.375 G IVPB: 3.3750 g | Freq: Three times a day (TID) | INTRAVENOUS | Status: DC

## 2015-01-18 MED ORDER — SENNOSIDES-DOCUSATE SODIUM 8.6-50 MG PO TABS
1.0000 | ORAL_TABLET | Freq: Two times a day (BID) | ORAL | Status: DC
  Administered 2015-01-18 – 2015-01-20 (×5): 1 via ORAL
  Filled 2015-01-18 (×5): qty 1

## 2015-01-18 MED ORDER — MIDAZOLAM HCL 2 MG/2ML IJ SOLN
INTRAMUSCULAR | Status: DC | PRN
  Administered 2015-01-18: 2 mg via INTRAVENOUS

## 2015-01-18 MED ORDER — ONDANSETRON HCL 4 MG/2ML IJ SOLN: 4.0000 mg | Freq: Once | INTRAMUSCULAR | Status: DC | PRN

## 2015-01-18 MED ORDER — ROCURONIUM BROMIDE 100 MG/10ML IV SOLN
INTRAVENOUS | Status: DC | PRN
  Administered 2015-01-18: 5 mg via INTRAVENOUS

## 2015-01-18 MED ORDER — BUPIVACAINE HCL (PF) 0.5 % IJ SOLN
INTRAMUSCULAR | Status: AC
  Filled 2015-01-18: qty 30

## 2015-01-18 MED ORDER — LIDOCAINE-EPINEPHRINE 1 %-1:100000 IJ SOLN
INTRAMUSCULAR | Status: AC
  Filled 2015-01-18: qty 1

## 2015-01-18 MED ORDER — CLINDAMYCIN PHOSPHATE 600 MG/50ML IV SOLN
600.0000 mg | Freq: Three times a day (TID) | INTRAVENOUS | Status: DC
  Administered 2015-01-18: 600 mg via INTRAVENOUS
  Filled 2015-01-18 (×5): qty 50

## 2015-01-18 MED ORDER — PIPERACILLIN-TAZOBACTAM 4.5 G IVPB
4.5000 g | Freq: Three times a day (TID) | INTRAVENOUS | Status: DC
  Administered 2015-01-18 – 2015-01-20 (×5): 4.5 g via INTRAVENOUS
  Filled 2015-01-18 (×11): qty 100

## 2015-01-18 MED ORDER — LIDOCAINE HCL (CARDIAC) 20 MG/ML IV SOLN
INTRAVENOUS | Status: DC | PRN
  Administered 2015-01-18: 60 mg via INTRAVENOUS

## 2015-01-18 MED ORDER — SUCCINYLCHOLINE CHLORIDE 20 MG/ML IJ SOLN
INTRAMUSCULAR | Status: DC | PRN
  Administered 2015-01-18: 120 mg via INTRAVENOUS

## 2015-01-18 MED ORDER — VANCOMYCIN HCL IN DEXTROSE 1-5 GM/200ML-% IV SOLN: 1000.0000 mg | INTRAVENOUS | Status: DC

## 2015-01-18 MED ORDER — FENTANYL CITRATE (PF) 100 MCG/2ML IJ SOLN
INTRAMUSCULAR | Status: DC | PRN
  Administered 2015-01-18: 100 ug via INTRAVENOUS

## 2015-01-18 MED ORDER — FENTANYL CITRATE (PF) 100 MCG/2ML IJ SOLN: 25.0000 ug | INTRAMUSCULAR | Status: DC | PRN

## 2015-01-18 MED ORDER — VANCOMYCIN HCL 10 G IV SOLR
1500.0000 mg | Freq: Two times a day (BID) | INTRAVENOUS | Status: DC
  Administered 2015-01-18 – 2015-01-20 (×4): 1500 mg via INTRAVENOUS
  Filled 2015-01-18 (×7): qty 1500

## 2015-01-18 MED ORDER — PHENYLEPHRINE HCL 10 MG/ML IJ SOLN
INTRAMUSCULAR | Status: DC | PRN
  Administered 2015-01-18 (×3): 200 ug via INTRAVENOUS
  Administered 2015-01-18: 50 ug via INTRAVENOUS
  Administered 2015-01-18 (×2): 100 ug via INTRAVENOUS

## 2015-01-18 SURGICAL SUPPLY — 56 items
BANDAGE ELASTIC 4 CLIP ST LF (GAUZE/BANDAGES/DRESSINGS) ×2 IMPLANT
BANDAGE STRETCH 3X4.1 STRL (GAUZE/BANDAGES/DRESSINGS) ×2 IMPLANT
BLADE OSC/SAGITTAL MD 5.5X18 (BLADE) ×2 IMPLANT
BLADE OSCILLATING/SAGITTAL (BLADE) ×1
BLADE SURG 15 STRL LF DISP TIS (BLADE) ×1 IMPLANT
BLADE SURG 15 STRL SS (BLADE) ×1
BLADE SW THK.38XMED LNG THN (BLADE) ×1 IMPLANT
BNDG COHESIVE 4X5 TAN STRL (GAUZE/BANDAGES/DRESSINGS) ×2 IMPLANT
BNDG COHESIVE 6X5 TAN STRL LF (GAUZE/BANDAGES/DRESSINGS) ×2 IMPLANT
BNDG ESMARK 4X12 TAN STRL LF (GAUZE/BANDAGES/DRESSINGS) ×2 IMPLANT
BNDG GAUZE 4.5X4.1 6PLY STRL (MISCELLANEOUS) ×2 IMPLANT
CANISTER SUCT 1200ML W/VALVE (MISCELLANEOUS) ×2 IMPLANT
CANISTER SUCT 3000ML (MISCELLANEOUS) ×2 IMPLANT
CUFF TOURN 18 STER (MISCELLANEOUS) ×4 IMPLANT
CUFF TOURN DUAL PL 12 NO SLV (MISCELLANEOUS) ×2 IMPLANT
DRAPE FLUOR MINI C-ARM 54X84 (DRAPES) ×2 IMPLANT
DRAPE XRAY CASSETTE 23X24 (DRAPES) ×2 IMPLANT
DURAPREP 26ML APPLICATOR (WOUND CARE) ×2 IMPLANT
GAUZE PACKING 1/4X5YD (GAUZE/BANDAGES/DRESSINGS) ×2 IMPLANT
GAUZE PACKING IODOFORM 1X5 (MISCELLANEOUS) ×2 IMPLANT
GAUZE PETRO XEROFOAM 1X8 (MISCELLANEOUS) ×2 IMPLANT
GAUZE SPONGE 4X4 12PLY STRL (GAUZE/BANDAGES/DRESSINGS) ×2 IMPLANT
GAUZE STRETCH 2X75IN STRL (MISCELLANEOUS) ×2 IMPLANT
GLOVE BIO SURGEON STRL SZ7.5 (GLOVE) ×2 IMPLANT
GLOVE INDICATOR 8.0 STRL GRN (GLOVE) ×2 IMPLANT
GOWN STRL REUS W/TWL MED LVL3 (GOWN DISPOSABLE) ×4 IMPLANT
HANDPIECE SUCTION TUBG SURGILV (MISCELLANEOUS) ×2 IMPLANT
HANDPIECE VERSAJET DEBRIDEMENT (MISCELLANEOUS) ×2 IMPLANT
HEMOSTAT SURGICEL 2X3 (HEMOSTASIS) ×2 IMPLANT
IV NS 1000ML (IV SOLUTION) ×1
IV NS 1000ML BAXH (IV SOLUTION) ×1 IMPLANT
KIT RM TURNOVER STRD PROC AR (KITS) ×2 IMPLANT
LABEL OR SOLS (LABEL) ×2 IMPLANT
NDL SAFETY 25GX1.5 (NEEDLE) ×2 IMPLANT
NEEDLE FILTER BLUNT 18X 1/2SAF (NEEDLE) ×1
NEEDLE FILTER BLUNT 18X1 1/2 (NEEDLE) ×1 IMPLANT
NS IRRIG 500ML POUR BTL (IV SOLUTION) ×2 IMPLANT
PACK EXTREMITY ARMC (MISCELLANEOUS) ×2 IMPLANT
PAD ABD DERMACEA PRESS 5X9 (GAUZE/BANDAGES/DRESSINGS) ×2 IMPLANT
PAD GROUND ADULT SPLIT (MISCELLANEOUS) ×2 IMPLANT
PENCIL ELECTRO HAND CTR (MISCELLANEOUS) ×2 IMPLANT
RASP SM TEAR CROSS CUT (RASP) ×2 IMPLANT
SOL .9 NS 3000ML IRR  AL (IV SOLUTION) ×1
SOL .9 NS 3000ML IRR UROMATIC (IV SOLUTION) ×1 IMPLANT
STOCKINETTE IMPERVIOUS 9X36 MD (GAUZE/BANDAGES/DRESSINGS) ×2 IMPLANT
STOCKINETTE M/LG 89821 (MISCELLANEOUS) ×2 IMPLANT
SUT ETHILON 2 0 FS 18 (SUTURE) ×4 IMPLANT
SUT ETHILON 4-0 (SUTURE) ×1
SUT ETHILON 4-0 FS2 18XMFL BLK (SUTURE) ×1
SUT VIC AB 3-0 SH 27 (SUTURE) ×1
SUT VIC AB 3-0 SH 27X BRD (SUTURE) ×1 IMPLANT
SUT VIC AB 4-0 FS2 27 (SUTURE) ×2 IMPLANT
SUTURE ETHLN 4-0 FS2 18XMF BLK (SUTURE) ×1 IMPLANT
SWAB CULTURE AMIES ANAERIB BLU (MISCELLANEOUS) ×2 IMPLANT
SYR 3ML LL SCALE MARK (SYRINGE) ×2 IMPLANT
SYRINGE 10CC LL (SYRINGE) ×4 IMPLANT

## 2015-01-18 NOTE — Anesthesia Procedure Notes (Signed)
Procedure Name: Intubation Date/Time: 01/18/2015 4:18 PM Performed by: Lily Kocher Pre-anesthesia Checklist: Patient identified, Emergency Drugs available, Suction available, Patient being monitored and Timeout performed Patient Re-evaluated:Patient Re-evaluated prior to inductionOxygen Delivery Method: Circle system utilized Preoxygenation: Pre-oxygenation with 100% oxygen Intubation Type: IV induction Ventilation: Two handed mask ventilation required Laryngoscope Size: Mac and 4 Grade View: Grade II Tube type: Oral Tube size: 7.5 mm Number of attempts: 1 Intubation method: ramped up with blankets; arms supported w/blankets. Placement Confirmation: ETT inserted through vocal cords under direct vision,  positive ETCO2 and breath sounds checked- equal and bilateral Secured at: 22 cm Tube secured with: Tape Dental Injury: Teeth and Oropharynx as per pre-operative assessment

## 2015-01-18 NOTE — Transfer of Care (Signed)
Immediate Anesthesia Transfer of Care Note  Patient: [name]  Procedure(s) Performed: Procedure(s): IRRIGATION AND DEBRIDEMENT EXTREMITY (Left)  Patient Location: PACU  Anesthesia Type:General  Level of Consciousness: awake and patient cooperative  Airway & Oxygen Therapy: Patient Spontanous Breathing and Patient connected to face mask oxygen  Post-op Assessment: Report given to RN  Post vital signs: Reviewed and stable  Last Vitals:  Filed Vitals:   01/18/15 1722  BP: 104/68  Pulse: 80  Temp: 37.3 C  Resp: 14    Complications: No apparent anesthesia complications

## 2015-01-18 NOTE — H&P (Signed)
  HISTORY AND PHYSICAL INTERVAL NOTE:  01/18/2015  4:06 PM  [name]  has presented today for surgery, with the diagnosis of Open wound left foot.  The various methods of treatment have been discussed with the patient.  No guarantees were given.  After consideration of risks, benefits and other options for treatment, the patient has consented to surgery.  I have reviewed the patients' chart and labs.    Patient Vitals for the past 24 hrs:  BP Temp Temp src Pulse Resp SpO2 Height Weight  01/18/15 0736 116/77 mmHg 97.8 F (36.6 C) Oral 79 18 92 % - -  01/18/15 8264 - - - - - - - (!) 156.491 kg (345 lb)  01/17/15 2229 (!) 108/59 mmHg 97.8 F (36.6 C) Oral 76 20 95 % 6' (1.829 m) (!) 157.942 kg (348 lb 3.2 oz)  01/17/15 1800 110/73 mmHg - - 78 - 92 % - -  01/17/15 1730 115/78 mmHg - - 80 - 95 % - -  01/17/15 1721 - - - 80 - 93 % - -  01/17/15 1720 117/73 mmHg - - - - - - -     The patient was reexamined.  There have been no changes to his initial history and physical examination.  [name]

## 2015-01-18 NOTE — Anesthesia Postprocedure Evaluation (Signed)
  Anesthesia Post-op Note  Patient: [name]  Procedure(s) Performed: Procedure(s): IRRIGATION AND DEBRIDEMENT EXTREMITY (Left)  Anesthesia type:General  Patient location: PACU  Post pain: Pain level controlled  Post assessment: Post-op Vital signs reviewed, Patient's Cardiovascular Status Stable, Respiratory Function Stable, Patent Airway and No signs of Nausea or vomiting  Post vital signs: Reviewed and stable  Last Vitals:  Filed Vitals:   01/18/15 1722  BP: 104/68  Pulse: 80  Temp: 37.3 C  Resp:     Level of consciousness: awake, alert  and patient cooperative  Complications: No apparent anesthesia complications

## 2015-01-18 NOTE — Anesthesia Preprocedure Evaluation (Addendum)
Anesthesia Evaluation  Patient identified by MRN, date of birth, ID band  Reviewed: Allergy & Precautions, NPO status , Patient's Chart, lab work & pertinent test results  History of Anesthesia Complications (+) PONV  Airway Mallampati: III  TM Distance: >3 FB Neck ROM: Full    Dental  (+) Chipped, Missing   Pulmonary COPD COPD inhaler, Current Smoker (<1/2 ppd),          Cardiovascular hypertension, + CAD and +CHF (Cardiomyopathy, EF 35-40%)     Neuro/Psych  Neuromuscular disease (peripheral neuropthy)    GI/Hepatic   Endo/Other  diabetes, Type obesity  Renal/GU Renal InsufficiencyRenal disease (Stage III)     Musculoskeletal   Abdominal   Peds  Hematology   Anesthesia Other Findings   Reproductive/Obstetrics                           Anesthesia Physical Anesthesia Plan  ASA: III and emergent  Anesthesia Plan: General   Post-op Pain Management:    Induction: Intravenous  Airway Management Planned: LMA  Additional Equipment:   Intra-op Plan:   Post-operative Plan:   Informed Consent: I have reviewed the patients History and Physical, chart, labs and discussed the procedure including the risks, benefits and alternatives for the proposed anesthesia with the patient or authorized representative who has indicated his/her understanding and acceptance.     Plan Discussed with:   Anesthesia Plan Comments:        Anesthesia Quick Evaluation

## 2015-01-18 NOTE — Op Note (Signed)
Operative note   Surgeon:Janssen Zee [employer] forces logistics/support/administrative officer: None    Preop diagnosis: Left foot abscess    Postop diagnosis: Same    Procedure: Incision and drainage of abscess left foot plantar aspect    EBL: 25 mL's    Anesthesia:general    Hemostasis: Ankle tourniquet at 250 mmHg for Oxley 20 minutes    Specimen: Wound culture    Complications: None    Operative indications: This is a 59 year old male admitted from the ER with an abscess to his left foot. He had a noted draining area from the plantar aspect of his left foot. Discussed operative I&D an consent has been given    Procedure:  Patient was brought into the OR and placed on the operating table in thesupine position. After anesthesia was obtained theleft lower extremity was prepped and draped in usual sterile fashion.  A sterile tourniquet was placed on his left foot. This was inflated prior to incision. A plantar incision was made under the left first MTPJ at the obvious abscessed area. Full-thickness incision was taken down through the subcutaneous tissue to the long flexor tendon. Marketed purulent drainage was noted from the shaft of the first metatarsal to the great toe. All areas were irrigated copiously with fluid. A versa jet was used to remove the necrotic and very infected tissue. All bleeders were Bovie cauterized. Closure of the skin was performed with a 3-0 nylon. The wound was packed with clean packing strips. A large bulky sterile dressing was applied to his left foot.    Patient tolerated the procedure and anesthesia well.  Was transported from the OR to the PACU with all vital signs stable and vascular status intact. He will be discharged to the floor. Will continue with close follow-up.

## 2015-01-18 NOTE — Progress Notes (Signed)
[facility] [place] [facility]   PATIENT NAME: [name]    MR#:  [id number]  DATE OF BIRTH:  [birth year]  SUBJECTIVE:  CHIEF COMPLAINT:   Chief Complaint  Patient presents with  . Leg Swelling   feeling better, waiting for surgery this afternoon when i saw him  REVIEW OF SYSTEMS:  Review of Systems  Constitutional: Positive for fever. Negative for weight loss, malaise/fatigue and diaphoresis.  HENT: Negative for ear discharge, ear pain, hearing loss, nosebleeds, sore throat and tinnitus.   Eyes: Negative for blurred vision and pain.  Respiratory: Negative for cough, hemoptysis, shortness of breath and wheezing.   Cardiovascular: Negative for chest pain, palpitations, orthopnea and leg swelling.  Gastrointestinal: Negative for heartburn, nausea, vomiting, abdominal pain, diarrhea, constipation and blood in stool.  Genitourinary: Negative for dysuria, urgency and frequency.  Musculoskeletal: Negative for myalgias and back pain.       Left foot pain corresponding to the area of cellulitis and abscess.  Skin: Negative for itching and rash.  Neurological: Negative for dizziness, tingling, tremors, focal weakness, seizures, weakness and headaches.  Psychiatric/Behavioral: Negative for depression. The patient is not nervous/anxious.    DRUG ALLERGIES:  No Known Allergies VITALS:  Blood pressure 116/77, pulse 79, temperature 97.8 F (36.6 C), temperature source Oral, resp. rate 18, height 6' (1.829 m), weight 156.491 kg (345 lb), SpO2 92 %. PHYSICAL EXAMINATION:  Physical Exam  Constitutional: He is oriented to person, place, and time and well-developed, well-nourished, and in no distress. Vital signs are normal. He appears to be writhing in pain. He appears unhealthy.  HENT:  Head: Normocephalic and atraumatic.  Eyes: Conjunctivae and EOM are normal. Pupils are equal, round, and reactive to light.  Neck: Normal range of motion. Neck supple. No  tracheal deviation present. No thyromegaly present.  Cardiovascular: Normal rate, regular rhythm and normal heart sounds.   Pulmonary/Chest: Effort normal and breath sounds normal. No respiratory distress. He has no wheezes. He exhibits no tenderness.  Abdominal: Soft. Bowel sounds are normal. He exhibits no distension. There is no tenderness.  Musculoskeletal: Normal range of motion.       Feet:  erythema (tracking up from the top of his foot to three quarters the way up his shin).  Neurological: He is alert and oriented to person, place, and time. No cranial nerve deficit.  Skin: Skin is warm and dry. No rash noted.  Psychiatric: Mood and affect normal.   LABORATORY PANEL:   CBC  Recent Labs Lab 01/18/15 0518  WBC 12.3*  HGB 12.6*  HCT 39.6*  PLT 271   ------------------------------------------------------------------------------------------------------------------ Chemistries   Recent Labs Lab 01/18/15 0518  NA 133*  K 4.2  CL 101  CO2 25  GLUCOSE 139*  BUN 23*  CREATININE 1.63*  CALCIUM 8.0*  AST 30  ALT 22  ALKPHOS 135*  BILITOT 0.7   RADIOLOGY:  Ct Tibia Fibula Left W Contrast  01/17/2015   CLINICAL DATA:  Stepped on staple few days ago, with swelling and erythema at the left lower leg. Initial encounter.  EXAM: CT OF THE LOWER LEFT EXTREMITY WITH CONTRAST  TECHNIQUE: Multidetector CT imaging of the left knee, leg and foot was performed according to the standard protocol following intravenous contrast administration.  COMPARISON:  Left foot radiographs performed 10/06/2011, Doppler venous ultrasound of the left lower extremity performed 12/26/2008, and left knee CT performed 03/10/2007  CONTRAST:  80mL OMNIPAQUE IOHEXOL 350 MG/ML SOLN  FINDINGS: There appears to  be a focal 2.6 x 2.4 x 1.7 cm abscess containing fluid and air plantar to the medial aspect of the first metatarsophalangeal joint. There is suggestion of minimal extension of air more superiorly, with soft  tissue edema extending about the first metatarsophalangeal joint. An overlying soft tissue laceration is seen. The appearance of the air within the abscess raises question for a gas-producing organism, though no definite diffuse necrotizing fasciitis is yet seen.  Mild diffuse soft tissue edema extends about the foot, ankle and lower leg, up to the level of the knee. Soft tissue edema is more prominent laterally and anteriorly. No additional abscess or soft tissue air is identified. No significant knee joint effusion is seen.  The visualized vasculature is unremarkable in appearance. The visualized flexor and extensor tendons are grossly unremarkable. The Achilles tendon remains intact. The peroneal tendons are unremarkable. The visualized musculature is unremarkable. There is no evidence of inflammation deep to the fascia layers.  A plate and screws are noted along the proximal tibia. There is chronic degenerative change at the tibiofibular articulation. An os naviculare is noted. There is no evidence of fracture or dislocation. No definite osseous erosion is seen to suggest osteomyelitis at this time.  IMPRESSION: 1. Focal 2.6 x 2 4 x 1.7 cm abscess containing fluid and air from a plantar to the medial aspect of the first metatarsophalangeal joint. Suggestion of minimal extension of air more superiorly, with soft tissue edema extending about the first metatarsophalangeal joint. Overlying soft tissue laceration seen. The appearance of the air within the abscess raises concern for a gas-producing organism, though no definite diffuse necrotizing fasciitis is yet seen. 2. Mild diffuse soft tissue edema extends about the foot, ankle and lower leg, to the level of the knee. Soft tissue edema is more prominent laterally and anteriorly. No additional abscess or soft tissue air seen. 3. No osseous erosions seen.  These results were called by telephone at the time of interpretation on 01/17/2015 at 8:18 pm to Dr. Phineas Semen , who verbally acknowledged these results.   Electronically Signed   By: Roanna Raider M.D.   On: 01/17/2015 20:20   Mr Foot Left Wo Contrast  01/18/2015   CLINICAL DATA:  Stepped on staple 3 weeks ago.  EXAM: MRI OF THE LEFT FOREFOOT WITHOUT CONTRAST  TECHNIQUE: Multiplanar, multisequence MR imaging was performed. No intravenous contrast was administered.  COMPARISON:  None.  FINDINGS: Significant patient motion degrading image quality limiting evaluation.  No focal marrow signal abnormality. Mild osteoarthritis of the first MTP joint. No fracture or dislocation. Normal alignment. No joint effusion.  Soft tissue swelling in the subcutaneous fat along the medial aspect of the first MTP joint extending into the plantar aspect with a 1.5 cm fluid collection with air present within the plantar soft tissue.  There is no other fluid collection or hematoma. The muscles are normal in signal. The visualized flexor and extensor compartment tendons are grossly intact, but degraded by a motion.  IMPRESSION: 1. Soft tissue swelling in the subcutaneous fat along the medial aspect of the first MTP joint extending into the plantar aspect with a 1.5 cm fluid collection with air present within the plantar soft tissues most consistent with infection and abscess. There is no evidence of osteomyelitis of the left forefoot.   Electronically Signed   By: Elige Ko   On: 01/18/2015 10:07   ASSESSMENT AND PLAN:   * Cellulitis and abscess of foot - continue vancomycin, Zosyn, clindamycin.  appreciate Podiatry input - s/p incision and drainage with debridement. Will c/s ID,  Await wound c/s from OR  * Diabetes mellitus, type 2 - continue sliding scale insulin before meals at bedtime while eating before midnight, then every 6 hours while nothing by mouth. Carb modified diet before and after procedure.   CAD (coronary artery disease) - continue home meds, patient is on appropriate medical management for this.  HTN  (hypertension) - stable at this time, continue home meds  CKD (chronic kidney disease), stage III - avoid nephrotoxins, monitor serum creatinine  Chronic combined systolic and diastolic CHF (congestive heart failure) - continue home medications, daily weights.  Hyperlipidemia - home meds  Peripheral neuropathy - continue home dose Lyrica.   All the records are reviewed and case discussed with Care Management/Social Workerr. Management plans discussed with the patient, family and they are in agreement.  CODE STATUS: Full code  TOTAL TIME TAKING CARE OF THIS PATIENT: 35 minutes.   More than 50% of the time was spent in counseling/coordination of care: YES  POSSIBLE D/C IN 1-2 DAYS, DEPENDING ON CLINICAL CONDITION.   [facility], Swan Zayed M.D on 01/18/2015 at 4:48 PM  Between 7am to 6pm - Pager - [phone]  After 6pm go to [email or web address] - password [email or web address] [facility]  [facility]  Office  [phone]  CC:  Primary care physician; No PCP Per Patient

## 2015-01-18 NOTE — Progress Notes (Addendum)
ANTIBIOTIC CONSULT NOTE - INITIAL  Pharmacy Consult for vancomycin and Zosyn dosing Indication: cellulitis and abscess of foot.  No Known Allergies  Patient Measurements: Height: 6' (182.9 cm) Weight: (!) 348 lb 3.2 oz (157.942 kg) IBW/kg (Calculated) : 77.6 Adjusted Body Weight: 110  Vital Signs: Temp: 97.8 F (36.6 C) (06/14 2229) Temp Source: Oral (06/14 2229) BP: 108/59 mmHg (06/14 2229) Pulse Rate: 76 (06/14 2229) Intake/Output from previous day: 06/14 0701 - 06/15 0700 In: 240 [P.O.:240] Out: 0  Intake/Output from this shift: Total I/O In: 240 [P.O.:240] Out: 0   Labs:  Recent Labs  01/17/15 1527  WBC 12.8*  HGB 12.5*  PLT 290  CREATININE 1.76*   Estimated Creatinine Clearance: 71.9 mL/min (by C-G formula based on Cr of 1.76). No results for input(s): VANCOTROUGH, VANCOPEAK, VANCORANDOM, GENTTROUGH, GENTPEAK, GENTRANDOM, TOBRATROUGH, TOBRAPEAK, TOBRARND, AMIKACINPEAK, AMIKACINTROU, AMIKACIN in the last 72 hours.   Microbiology: No results found for this or any previous visit (from the past 720 hour(s)).  Medical History: Past Medical History  Diagnosis Date  . COPD (chronic obstructive pulmonary disease)   . Hyperlipidemia   . Hypertension   . Type II diabetes mellitus   . Peripheral neuropathy   . Tobacco abuse     a. 46 yrs, up to 2 ppd, changed to e-cigarette 05/2013.  . Cardiomyopathy     a. 10/2013 Echo: EF 35-40%, mild MR/TR;  b. 11/2013 Lexi MV: EF 46%, fixed apical defect with small distal antsept ischemia->overall low-risk->med Rx.  . Secondary erythrocytosis     a. followed by heme-onc with periodic phlebotomy.  . CKD (chronic kidney disease), stage III     a. Creat 1.42 (10/2013).  . Ischemic toe     a. 10/2013 L fifth toe - felt to be either 2/2 embolic plaque vs erythrocytosis - seen by vascular surgery (Dew) ->conservative rx.  Marland Kitchen LBBB (left bundle branch block)   . Collagen vascular disease   . CHF (congestive heart failure)      Medications:   Assessment: Blood and wound cx pending  Goal of Therapy:  Vancomycin trough level 15-20 mcg/ml  Plan:  TBW 157.9kg  IBW 77.6kg  DW 110kg  Vd 77L kei 0.064 hr-1  t1/2 11 hours 1 gram in ED. 1500 mg q 12 hours ordered as continuation. Level ordered before 4th dose at 15:00, 6/16.  Zosyn 3.375 grams q 8 hours ordered originally. Changed to 4.5 grams q 8 hours for TBW >120kg.  Vancomycin trough 6/16 @ 14:30 = 84mcg/mL Will continue this pt on current dose of Vancomycin 1500 mg IV Q12H.   McBane,Matthew S 01/18/2015,1:32 AM

## 2015-01-18 NOTE — Care Management (Signed)
Met with patient and his wife to discuss discharge planning. He would like to return home at discharge. Recent hospital visit 01/09/15. He is familiar with home health agency- [facility] and agrees to home health if medically necessary. His PCP is with [facility].He ambulates with a walker. He uses [facility] in [place] for Rx. RNCM to continue to follow.

## 2015-01-19 ENCOUNTER — Encounter: Payer: Self-pay | Admitting: Podiatry

## 2015-01-19 LAB — GLUCOSE, CAPILLARY
GLUCOSE-CAPILLARY: 187 mg/dL — AB (ref 65–99)
Glucose-Capillary: 126 mg/dL — ABNORMAL HIGH (ref 65–99)
Glucose-Capillary: 198 mg/dL — ABNORMAL HIGH (ref 65–99)
Glucose-Capillary: 206 mg/dL — ABNORMAL HIGH (ref 65–99)

## 2015-01-19 LAB — VANCOMYCIN, TROUGH: Vancomycin Tr: 19 ug/mL (ref 10–20)

## 2015-01-19 NOTE — Progress Notes (Signed)
Pt refuses bed alarm, educated on need for it.  Wife in room and assists pt with all needs.

## 2015-01-19 NOTE — Evaluation (Signed)
Physical Therapy Evaluation Patient Details Name: [name] MRN: [id number] DOB: 12/15/1955 Today's Date: 01/19/2015   History of Present Illness  presented to ER secondary to progressive redness, inflammation of L LE after stepping on staple (minimal improvement with outpatient antibiotics); admitted with cellulitis and abscess of L LE.  Status post surgical debridement 6/15; heel WBing through L LE with post-op shoe donned.  Clinical Impression  Upon evaluation, patient alert and oriented, follows all commands and demonstrates good safety awareness/insight.  Bilat UE/LE strength and ROM grossly WFL; baseline sensory deficits noted knees distally bilat LEs (L > R) with complete loss of sensation to L foot.  Able to complete bed mobility indep; sit/stand, basic transfers and gait (60') with RW, cga/close sup.  Min cuing for L LE WBing restrictions and gait pattern.  Fair/good stability without buckling/LOB. Provided education regarding WBing restrictions, donning/doffing and use of post-op shoe, foot/skin inspectionn, signs/symptoms of infection, home safety modification and car transfers. Patient/wife voiced understanding of all information. Would benefit from skilled PT to address above deficits and promote optimal return to PLOF; Recommend transition to HHPT upon discharge from acute hospitalization for home safety assessment.  May improve to no PT needs upon discharge; will continue to monitor    Follow Up Recommendations Home health PT    Equipment Recommendations       Recommendations for Other Services       Precautions / Restrictions Precautions Precautions: Fall Restrictions Weight Bearing Restrictions: Yes Other Position/Activity Restrictions: heel WBing L LE with post-op shoe      Mobility  Bed Mobility Overal bed mobility: Modified Independent                Transfers Overall transfer level: Needs assistance Equipment used: Rolling walker (2  wheeled) Transfers: Sit to/from Stand Sit to Stand: Min guard;Supervision            Ambulation/Gait Ambulation/Gait assistance: Min guard;Supervision Ambulation Distance (Feet): 60 Feet Assistive device: Rolling walker (2 wheeled)       General Gait Details: 3-point, step to gait pattern; cuing/education for WBing precautions.  no overt buckling or LOB; good stability  Stairs            Wheelchair Mobility    Modified Rankin (Stroke Patients Only)       Balance Overall balance assessment: Needs assistance Sitting-balance support: No upper extremity supported;Feet supported Sitting balance-Leahy Scale: Normal     Standing balance support: Bilateral upper extremity supported Standing balance-Leahy Scale: Good                               Pertinent Vitals/Pain Pain Assessment: 0-10 Pain Score: 7  Pain Location: L LE Pain Descriptors / Indicators: Aching Pain Intervention(s): Limited activity within patient's tolerance;Monitored during session;Repositioned    Home Living Family/patient expects to be discharged to:: Private residence Living Arrangements: Spouse/significant other Available Help at Discharge: Family Type of Home: House Home Access: Ramped entrance     Home Layout: One level Home Equipment: Environmental consultant - standard      Prior Function Level of Independence: Independent with assistive device(s)         Comments: Indep with intermittent use of RW for household and community activities; indep with ADLs and toileting.  Does endorse 2 falls within previous 3 months ("not really falls; got down and couldn't get back up")     Hand Dominance  Extremity/Trunk Assessment   Upper Extremity Assessment: Overall WFL for tasks assessed           Lower Extremity Assessment: Overall WFL for tasks assessed (baseline neuropathy from knees distally bilat LEs (L > R) with complete loss of sensation to L foot)          Communication   Communication: No difficulties  Cognition Arousal/Alertness: Awake/alert Behavior During Therapy: WFL for tasks assessed/performed Overall Cognitive Status: Within Functional Limits for tasks assessed                      General Comments General comments (skin integrity, edema, etc.): L LE with mild redness mid-calf distally; surgical dressing and ace wrap with post-op shoe donned to L LE    Exercises Other Exercises Other Exercises: Educated in 3-point, step to gait pattern for optimal adherence to heel WBing L LE; educated in use, donning/doffing of L post-op shoe. Other Exercises: Educated in skin inspection (encouraged use of long-handled mirror), signs/symptoms of infection due to limited sensory awareness to bilat LEs. Other Exercises: Educated in home safety modifications, car tranfser technique and use of RW with all mobility until [facility] restrictions lifted.  Patient/wife voiced understanding of all education. (10 minutes)      Assessment/Plan    PT Assessment Patient needs continued PT services  PT Diagnosis Difficulty walking;Acute pain   PT Problem List Decreased activity tolerance;Decreased mobility;Decreased knowledge of precautions;Pain  PT Treatment Interventions Gait training;DME instruction;Stair training;Therapeutic activities;Therapeutic exercise;Functional mobility training;Patient/family education   PT Goals (Current goals can be found in the Care Plan section) Acute Rehab PT Goals Patient Stated Goal: "to return home" PT Goal Formulation: With patient Time For Goal Achievement: 02/02/15 Potential to Achieve Goals: Good Additional Goals Additional Goal #1: Patient will maintain adherence to L LE WBing restrictions (heel WBing) with all functional activities    Frequency Min 2X/week   Barriers to discharge        Co-evaluation               End of Session Equipment Utilized During Treatment:  (L post-op shoe) Activity  Tolerance: Patient tolerated treatment well Patient left: in bed;with call bell/phone within reach;with bed alarm set Nurse Communication: Mobility status;Weight bearing status;Precautions         Time: 0865-7846 PT Time Calculation (min) (ACUTE ONLY): 19 min   Charges:   PT Evaluation $Initial PT Evaluation Tier I: 1 Procedure PT Treatments $Therapeutic Activity: 8-22 mins   PT G Codes:       Shawntrice Salle H. Manson Passey, PT, DPT 01/19/2015, 9:37 AM [phone]

## 2015-01-19 NOTE — Progress Notes (Signed)
Daily Progress Note   Subjective  - 1 Day Post-Op  Patient is status post left foot I and D. He is doing well  Objective Filed Vitals:   01/18/15 1807 01/18/15 1842 01/19/15 0022 01/19/15 0822  BP:  104/68 106/68 124/102  Pulse:  71 86 80  Temp:  98.1 F (36.7 C) 97.6 F (36.4 C) 97.7 F (36.5 C)  TempSrc:  Oral Oral Oral  Resp:   14   Height:      Weight:   155.448 kg (342 lb 11.2 oz)   SpO2: 95% 93% 100% 100%    Physical Exam:  marked less erythema.  Minimal to no purulence.  Foul odor is markedly improved at this point.  Cellulitis is decreased.  Edema is decreased.  Laboratory CBC    Component Value Date/Time   WBC 12.3* 01/18/2015 0518   WBC 8.4 03/22/2014 1328   WBC 8.0 12/28/2013 1546   HGB 12.6* 01/18/2015 0518   HGB 13.5 03/22/2014 1328   HCT 39.6* 01/18/2015 0518   HCT 44.0 11/29/2014 1343   PLT 271 01/18/2015 0518   PLT 171 03/22/2014 1328    BMET    Component Value Date/Time   NA 133* 01/18/2015 0518   NA 137 12/28/2013 1546   NA 136 10/21/2013 0616   K 4.2 01/18/2015 0518   K 4.2 10/21/2013 0616   CL 101 01/18/2015 0518   CL 100 10/21/2013 0616   CO2 25 01/18/2015 0518   CO2 34* 10/21/2013 0616   GLUCOSE 139* 01/18/2015 0518   GLUCOSE 85 12/28/2013 1546   GLUCOSE 103* 10/21/2013 0616   BUN 23* 01/18/2015 0518   BUN 10 12/28/2013 1546   BUN 16 10/21/2013 0616   CREATININE 1.63* 01/18/2015 0518   CREATININE 1.42* 10/21/2013 0616   CALCIUM 8.0* 01/18/2015 0518   CALCIUM 9.1 10/21/2013 0616   GFRNONAA 45* 01/18/2015 0518   GFRNONAA 55* 10/21/2013 0616   GFRAA 52* 01/18/2015 0518   GFRAA >60 10/21/2013 0616    Assessment/Planning:   This continues to improve.  A new dressing was applied by myself today.  The wound was flushed.  Will continue to monitor this for the next 1-2 days.  Awaiting culture results at this time.  I will see him tomorrow for further evaluation.    Gwyneth Revels A  01/19/2015, 2:13 PM

## 2015-01-19 NOTE — Progress Notes (Signed)
[facility] [place] [facility] [place] [facility]   PATIENT NAME: [name] Diaz    MR#:  [id number]  DATE OF BIRTH:  02-Jan-1956  SUBJECTIVE:  CHIEF COMPLAINT:   Chief Complaint  Patient presents with  . Leg Swelling   feeling better  REVIEW OF SYSTEMS:  Review of Systems  Constitutional: Positive for fever. Negative for weight loss, malaise/fatigue and diaphoresis.  HENT: Negative for ear discharge, ear pain, hearing loss, nosebleeds, sore throat and tinnitus.   Eyes: Negative for blurred vision and pain.  Respiratory: Negative for cough, hemoptysis, shortness of breath and wheezing.   Cardiovascular: Negative for chest pain, palpitations, orthopnea and leg swelling.  Gastrointestinal: Negative for heartburn, nausea, vomiting, abdominal pain, diarrhea, constipation and blood in stool.  Genitourinary: Negative for dysuria, urgency and frequency.  Musculoskeletal: Negative for myalgias and back pain.       Left foot pain corresponding to the area of cellulitis and abscess.  Skin: Negative for itching and rash.  Neurological: Negative for dizziness, tingling, tremors, focal weakness, seizures, weakness and headaches.  Psychiatric/Behavioral: Negative for depression. The patient is not nervous/anxious.    DRUG ALLERGIES:  No Known Allergies VITALS:  Blood pressure 104/66, pulse 69, temperature 97.5 F (36.4 C), temperature source Oral, resp. rate 14, height 6' (1.829 m), weight 155.448 kg (342 lb 11.2 oz), SpO2 99 %. PHYSICAL EXAMINATION:  Physical Exam  Constitutional: He is oriented to person, place, and time and well-developed, well-nourished, and in no distress. Vital signs are normal. He appears to be writhing in pain. He appears unhealthy.  HENT:  Head: Normocephalic and atraumatic.  Eyes: Conjunctivae and EOM are normal. Pupils are equal, round, and reactive to light.  Neck: Normal range of motion. Neck supple. No tracheal deviation present. No thyromegaly  present.  Cardiovascular: Normal rate, regular rhythm and normal heart sounds.   Pulmonary/Chest: Effort normal and breath sounds normal. No respiratory distress. He has no wheezes. He exhibits no tenderness.  Abdominal: Soft. Bowel sounds are normal. He exhibits no distension. There is no tenderness.  Musculoskeletal: Normal range of motion.       Feet:  erythema (tracking up from the top of his foot to three quarters the way up his shin).  Neurological: He is alert and oriented to person, place, and time. No cranial nerve deficit.  Skin: Skin is warm and dry. No rash noted.  Psychiatric: Mood and affect normal.   LABORATORY PANEL:   CBC  Recent Labs Lab 01/18/15 0518  WBC 12.3*  HGB 12.6*  HCT 39.6*  PLT 271   ------------------------------------------------------------------------------------------------------------------ Chemistries   Recent Labs Lab 01/18/15 0518  NA 133*  K 4.2  CL 101  CO2 25  GLUCOSE 139*  BUN 23*  CREATININE 1.63*  CALCIUM 8.0*  AST 30  ALT 22  ALKPHOS 135*  BILITOT 0.7    ASSESSMENT AND PLAN:   * Cellulitis and abscess of foot - continue vancomycin, Zosyn, clindamycin. appreciate Podiatry input - s/p incision and drainage with debridement. Appreciate ID input,  Await wound c/s from OR  * Diabetes mellitus, type 2 - continue sliding scale insulin before meals at bedtime while eating before midnight, then every 6 hours while nothing by mouth. Carb modified diet before and after procedure.   CAD (coronary artery disease) - continue home meds, patient is on appropriate medical management for this.  HTN (hypertension) - stable at this time, continue home meds  CKD (chronic kidney disease), stage III - avoid nephrotoxins, monitor  serum creatinine  Chronic combined systolic and diastolic CHF (congestive heart failure) - continue home medications, daily weights.  Hyperlipidemia - home meds  Peripheral neuropathy - continue home dose  Lyrica.   All the records are reviewed and case discussed with Care Management/Social Workerr. Management plans discussed with the patient, family and they are in agreement.  CODE STATUS: Full code  TOTAL TIME TAKING CARE OF THIS PATIENT: 35 minutes.   More than 50% of the time was spent in counseling/coordination of care: YES  POSSIBLE D/C IN 1-2 DAYS, DEPENDING ON CLINICAL CONDITION.   [name], [name] on 01/19/2015 at 8:01 PM  Between 7am to 6pm - Pager - [phone]  After 6pm go to [email or web address] - password [email or web address]  [place] [facility]  Office  [phone]  CC:  Primary care physician; No PCP Per Patient

## 2015-01-19 NOTE — Consult Note (Signed)
[facility] Infectious Disease     Reason for Consult: Abscess, cellulitis    Referring Physician: Max Sane Date of Admission:  01/17/2015   Principal Problem:   Cellulitis and abscess of foot Active Problems:   Hyperlipidemia   Diabetes mellitus, type 2   CAD (coronary artery disease)   HTN (hypertension)   Peripheral neuropathy   CKD (chronic kidney disease), stage III   Chronic combined systolic and diastolic CHF (congestive heart failure)   HPI: [name] is a 59 y.o. male with poorly controlled DM, severe PN admitted with L foot infection. Apparently he had a staple through his show abotu 2 weeks ago and did not notice it was there. This lead to wound on bottom of foot and he was treated with oral abx - levofloxacin - but failed and was admitted 6/14 with woresning swelling and redness up foot. Since then he has had I and D by Dr Vickki Muff, 6/15 with impressive purulence and infection from shaft of first MT to great toe.  No obvious osteomyelitis.  He has been on IV anx and redness, swelling much improved. No fevers chills. He thinks his A1c is around 8.5. No allergies.  Past Medical History  Diagnosis Date  . COPD (chronic obstructive pulmonary disease)   . Hyperlipidemia   . Hypertension   . Type II diabetes mellitus   . Peripheral neuropathy   . Tobacco abuse     a. 46 yrs, up to 2 ppd, changed to e-cigarette 05/2013.  . Cardiomyopathy     a. 10/2013 Echo: EF 35-40%, mild MR/TR;  b. 11/2013 Lexi MV: EF 46%, fixed apical defect with small distal antsept ischemia->overall low-risk->med Rx.  . Secondary erythrocytosis     a. followed by heme-onc with periodic phlebotomy.  . CKD (chronic kidney disease), stage III     a. Creat 1.42 (10/2013).  . Ischemic toe     a. 10/2013 L fifth toe - felt to be either 2/2 embolic plaque vs erythrocytosis - seen by vascular surgery (Dew) ->conservative rx.  Marland Kitchen LBBB (left bundle branch block)   . Collagen vascular disease   . CHF  (congestive heart failure)    Past Surgical History  Procedure Laterality Date  . Orif tibia fracture    . I&d extremity Left 01/18/2015    Procedure: IRRIGATION AND DEBRIDEMENT EXTREMITY;  Surgeon: Samara Deist, DPM;  Location: [facility] ORS;  Service: Podiatry;  Laterality: Left;   History  Substance Use Topics  . Smoking status: Current Every Day Smoker -- 2.00 packs/day for 46 years    Types: Cigarettes, E-cigarettes  . Smokeless tobacco: Not on file     Comment: Currently smoking E cigarettes.  . Alcohol Use: No     Comment: alcohol abuse in the past.    Family History  Problem Relation Age of Onset  . Hypertension Mother     alive @ 86  . Hyperlipidemia Mother   . Hypertension Brother   . Hyperlipidemia Brother   . Hypertension Maternal Aunt   . Hypertension Brother   . Hyperlipidemia Brother   . Hypertension Brother   . Hyperlipidemia Brother   . Diabetes Brother     Allergies: No Known Allergies  Current antibiotics: Antibiotics Given (last 72 hours)    Date/Time Action Medication Dose Rate   01/18/15 0300 Given  [Given in ED]   vancomycin (VANCOCIN) 1,500 mg in sodium chloride 0.9 % 500 mL IVPB 1,500 mg 250 mL/hr   01/18/15 0430 Given  clindamycin (CLEOCIN) IVPB 600 mg 600 mg 100 mL/hr   01/18/15 2304 Given   piperacillin-tazobactam (ZOSYN) IVPB 4.5 g 4.5 g 25 mL/hr   01/19/15 0320 Given   vancomycin (VANCOCIN) 1,500 mg in sodium chloride 0.9 % 500 mL IVPB 1,500 mg 250 mL/hr   01/19/15 0831 Given   piperacillin-tazobactam (ZOSYN) IVPB 4.5 g 4.5 g 25 mL/hr      MEDICATIONS: . aspirin  81 mg Oral Daily  . clopidogrel  75 mg Oral Q breakfast  . heparin  5,000 Units Subcutaneous 3 times per day  . insulin aspart  0-9 Units Subcutaneous Q6H  . isosorbide mononitrate  15 mg Oral Daily  . lisinopril  10 mg Oral Daily  . metoprolol tartrate  25 mg Oral BID  . piperacillin-tazobactam (ZOSYN)  IV  4.5 g Intravenous 3 times per day  . pravastatin  40 mg Oral  q1800  . pregabalin  100 mg Oral TID  . senna-docusate  1 tablet Oral BID  . sertraline  50 mg Oral Daily  . traZODone  150 mg Oral QHS  . vancomycin  1,500 mg Intravenous Q12H    Review of Systems - 11 systems reviewed and negative per HPI   OBJECTIVE: Temp:  [97.6 F (36.4 C)-99.1 F (37.3 C)] 97.7 F (36.5 C) (06/16 0822) Pulse Rate:  [52-111] 80 (06/16 0822) Resp:  [11-14] 14 (06/16 0022) BP: (104-124)/(57-102) 124/102 mmHg (06/16 0822) SpO2:  [93 %-100 %] 100 % (06/16 0822) Weight:  [155.448 kg (342 lb 11.2 oz)] 155.448 kg (342 lb 11.2 oz) (06/16 0022) Constitutional: He is oriented to person, place, and time. He appears well-developed and well-nourished. No distress. Obese HENT:  Head: Normocephalic and atraumatic.  Mouth/Throat: Oropharynx is clear and moist.  Eyes: Conjunctivae and EOM are normal. Pupils are equal, round, and reactive to light. No scleral icterus.  Neck: Normal range of motion. Neck supple. No JVD present. No thyromegaly present.  Cardiovascular: Normal rate, regular rhythm and intact distal pulses. Exam reveals no gallop and no friction rub.  No murmur heard. Respiratory: Effort normal and breath sounds normal. No respiratory distress. He has no wheezes. He has no rales.  GI: Soft. Bowel sounds are normal. He exhibits no distension. There is no tenderness.  Musculoskeletal: Normal range of motion. He exhibits no edema.  Lymphadenopathy:   He has no cervical adenopathy.  Neurological: He is alert and oriented to person, place, and time. No cranial nerve deficit.  Skin: Skin is warm and dry. No rash noted. L foot wrapped post op  Psychiatric: He has a normal mood and affect. His behavior is normal. Judgment and thought content normal.     LABS: Results for orders placed or performed during the hospital encounter of 01/17/15 (from the past 48 hour(s))  CBC with Differential/Platelet     Status: Abnormal   Collection Time: 01/17/15  3:27 PM   Result Value Ref Range   WBC 12.8 (H) 3.8 - 10.6 K/uL   RBC 5.56 4.40 - 5.90 MIL/uL   Hemoglobin 12.5 (L) 13.0 - 18.0 g/dL   HCT 40.3 40.0 - 52.0 %   MCV 72.4 (L) 80.0 - 100.0 fL   MCH 22.4 (L) 26.0 - 34.0 pg   MCHC 31.0 (L) 32.0 - 36.0 g/dL   RDW 19.5 (H) 11.5 - 14.5 %   Platelets 290 150 - 440 K/uL   Neutrophils Relative % 78 %   Neutro Abs 10.1 (H) 1.4 - 6.5 K/uL   Lymphocytes  Relative 12 %   Lymphs Abs 1.5 1.0 - 3.6 K/uL   Monocytes Relative 7 %   Monocytes Absolute 0.9 0.2 - 1.0 K/uL   Eosinophils Relative 2 %   Eosinophils Absolute 0.2 0 - 0.7 K/uL   Basophils Relative 1 %   Basophils Absolute 0.1 0 - 0.1 K/uL  Basic metabolic panel     Status: Abnormal   Collection Time: 01/17/15  3:27 PM  Result Value Ref Range   Sodium 131 (L) 135 - 145 mmol/L   Potassium 4.7 3.5 - 5.1 mmol/L   Chloride 99 (L) 101 - 111 mmol/L   CO2 27 22 - 32 mmol/L   Glucose, Bld 292 (H) 65 - 99 mg/dL   BUN 24 (H) 6 - 20 mg/dL   Creatinine, Ser 1.76 (H) 0.61 - 1.24 mg/dL   Calcium 8.4 (L) 8.9 - 10.3 mg/dL   GFR calc non Af Amer 41 (L) >60 mL/min   GFR calc Af Amer 48 (L) >60 mL/min    Comment: (NOTE) The eGFR has been calculated using the CKD EPI equation. This calculation has not been validated in all clinical situations. eGFR's persistently <60 mL/min signify possible Chronic Kidney Disease.    Anion gap 5 5 - 15  Blood culture (routine x 2)     Status: None (Preliminary result)   Collection Time: 01/17/15  5:25 PM  Result Value Ref Range   Specimen Description BLOOD    Special Requests NONE    Culture NO GROWTH 2 DAYS    Report Status PENDING   Blood culture (routine x 2)     Status: None (Preliminary result)   Collection Time: 01/17/15  5:25 PM  Result Value Ref Range   Specimen Description BLOOD    Special Requests NONE    Culture NO GROWTH 2 DAYS    Report Status PENDING   Lactic acid, plasma     Status: None   Collection Time: 01/17/15  8:10 PM  Result Value Ref Range    Lactic Acid, Venous 0.9 0.5 - 2.0 mmol/L  Wound culture     Status: None (Preliminary result)   Collection Time: 01/17/15 10:07 PM  Result Value Ref Range   Specimen Description FOOT    Special Requests NONE    Gram Stain      FEW WBC SEEN MANY GRAM POSITIVE COCCI IN CLUSTERS MANY GRAM NEGATIVE RODS    Culture      HOLDING FOR POSSIBLE PATHOGEN TRYING TO ISOLATE MULTIPLE ORGANISMS    Report Status PENDING   Glucose, capillary     Status: Abnormal   Collection Time: 01/17/15 10:46 PM  Result Value Ref Range   Glucose-Capillary 177 (H) 65 - 99 mg/dL  Lactic acid, plasma     Status: None   Collection Time: 01/17/15 11:25 PM  Result Value Ref Range   Lactic Acid, Venous 0.7 0.5 - 2.0 mmol/L  CBC     Status: Abnormal   Collection Time: 01/18/15  5:18 AM  Result Value Ref Range   WBC 12.3 (H) 3.8 - 10.6 K/uL   RBC 5.41 4.40 - 5.90 MIL/uL   Hemoglobin 12.6 (L) 13.0 - 18.0 g/dL   HCT 39.6 (L) 40.0 - 52.0 %   MCV 73.3 (L) 80.0 - 100.0 fL   MCH 23.3 (L) 26.0 - 34.0 pg   MCHC 31.7 (L) 32.0 - 36.0 g/dL   RDW 20.3 (H) 11.5 - 14.5 %   Platelets 271 150 - 440 K/uL  Protime-INR     Status: Abnormal   Collection Time: 01/18/15  5:18 AM  Result Value Ref Range   Prothrombin Time 15.8 (H) 11.4 - 15.0 seconds   INR 1.24   Comprehensive metabolic panel     Status: Abnormal   Collection Time: 01/18/15  5:18 AM  Result Value Ref Range   Sodium 133 (L) 135 - 145 mmol/L   Potassium 4.2 3.5 - 5.1 mmol/L   Chloride 101 101 - 111 mmol/L   CO2 25 22 - 32 mmol/L   Glucose, Bld 139 (H) 65 - 99 mg/dL   BUN 23 (H) 6 - 20 mg/dL   Creatinine, Ser 1.63 (H) 0.61 - 1.24 mg/dL   Calcium 8.0 (L) 8.9 - 10.3 mg/dL   Total Protein 7.7 6.5 - 8.1 g/dL   Albumin 2.3 (L) 3.5 - 5.0 g/dL   AST 30 15 - 41 U/L   ALT 22 17 - 63 U/L   Alkaline Phosphatase 135 (H) 38 - 126 U/L   Total Bilirubin 0.7 0.3 - 1.2 mg/dL   GFR calc non Af Amer 45 (L) >60 mL/min   GFR calc Af Amer 52 (L) >60 mL/min    Comment:  (NOTE) The eGFR has been calculated using the CKD EPI equation. This calculation has not been validated in all clinical situations. eGFR's persistently <60 mL/min signify possible Chronic Kidney Disease.    Anion gap 7 5 - 15  Glucose, capillary     Status: Abnormal   Collection Time: 01/18/15  6:02 AM  Result Value Ref Range   Glucose-Capillary 145 (H) 65 - 99 mg/dL  Glucose, capillary     Status: Abnormal   Collection Time: 01/18/15 11:29 AM  Result Value Ref Range   Glucose-Capillary 183 (H) 65 - 99 mg/dL  Glucose, capillary     Status: Abnormal   Collection Time: 01/18/15  5:27 PM  Result Value Ref Range   Glucose-Capillary 135 (H) 65 - 99 mg/dL  Glucose, capillary     Status: Abnormal   Collection Time: 01/19/15 12:35 AM  Result Value Ref Range   Glucose-Capillary 187 (H) 65 - 99 mg/dL  Glucose, capillary     Status: Abnormal   Collection Time: 01/19/15  6:35 AM  Result Value Ref Range   Glucose-Capillary 126 (H) 65 - 99 mg/dL  Glucose, capillary     Status: Abnormal   Collection Time: 01/19/15 11:59 AM  Result Value Ref Range   Glucose-Capillary 198 (H) 65 - 99 mg/dL   No components found for: ESR, C REACTIVE PROTEIN MICRO: Recent Results (from the past 720 hour(s))  Blood culture (routine x 2)     Status: None (Preliminary result)   Collection Time: 01/17/15  5:25 PM  Result Value Ref Range Status   Specimen Description BLOOD  Final   Special Requests NONE  Final   Culture NO GROWTH 2 DAYS  Final   Report Status PENDING  Incomplete  Blood culture (routine x 2)     Status: None (Preliminary result)   Collection Time: 01/17/15  5:25 PM  Result Value Ref Range Status   Specimen Description BLOOD  Final   Special Requests NONE  Final   Culture NO GROWTH 2 DAYS  Final   Report Status PENDING  Incomplete  Wound culture     Status: None (Preliminary result)   Collection Time: 01/17/15 10:07 PM  Result Value Ref Range Status   Specimen Description FOOT  Final    Special Requests  NONE  Final   Gram Stain   Final    FEW WBC SEEN MANY GRAM POSITIVE COCCI IN CLUSTERS MANY GRAM NEGATIVE RODS    Culture   Final    HOLDING FOR POSSIBLE PATHOGEN TRYING TO ISOLATE MULTIPLE ORGANISMS    Report Status PENDING  Incomplete    IMAGING: Ct Tibia Fibula Left W Contrast  01/17/2015   CLINICAL DATA:  Stepped on staple few days ago, with swelling and erythema at the left lower leg. Initial encounter.  EXAM: CT OF THE LOWER LEFT EXTREMITY WITH CONTRAST  TECHNIQUE: Multidetector CT imaging of the left knee, leg and foot was performed according to the standard protocol following intravenous contrast administration.  COMPARISON:  Left foot radiographs performed 10/06/2011, Doppler venous ultrasound of the left lower extremity performed 12/26/2008, and left knee CT performed 03/10/2007  CONTRAST:  15m OMNIPAQUE IOHEXOL 350 MG/ML SOLN  FINDINGS: There appears to be a focal 2.6 x 2.4 x 1.7 cm abscess containing fluid and air plantar to the medial aspect of the first metatarsophalangeal joint. There is suggestion of minimal extension of air more superiorly, with soft tissue edema extending about the first metatarsophalangeal joint. An overlying soft tissue laceration is seen. The appearance of the air within the abscess raises question for a gas-producing organism, though no definite diffuse necrotizing fasciitis is yet seen.  Mild diffuse soft tissue edema extends about the foot, ankle and lower leg, up to the level of the knee. Soft tissue edema is more prominent laterally and anteriorly. No additional abscess or soft tissue air is identified. No significant knee joint effusion is seen.  The visualized vasculature is unremarkable in appearance. The visualized flexor and extensor tendons are grossly unremarkable. The Achilles tendon remains intact. The peroneal tendons are unremarkable. The visualized musculature is unremarkable. There is no evidence of inflammation deep to the  fascia layers.  A plate and screws are noted along the proximal tibia. There is chronic degenerative change at the tibiofibular articulation. An os naviculare is noted. There is no evidence of fracture or dislocation. No definite osseous erosion is seen to suggest osteomyelitis at this time.  IMPRESSION: 1. Focal 2.6 x 2 4 x 1.7 cm abscess containing fluid and air from a plantar to the medial aspect of the first metatarsophalangeal joint. Suggestion of minimal extension of air more superiorly, with soft tissue edema extending about the first metatarsophalangeal joint. Overlying soft tissue laceration seen. The appearance of the air within the abscess raises concern for a gas-producing organism, though no definite diffuse necrotizing fasciitis is yet seen. 2. Mild diffuse soft tissue edema extends about the foot, ankle and lower leg, to the level of the knee. Soft tissue edema is more prominent laterally and anteriorly. No additional abscess or soft tissue air seen. 3. No osseous erosions seen.  These results were called by telephone at the time of interpretation on 01/17/2015 at 8:18 pm to Dr. GNance Pear, who verbally acknowledged these results.   Electronically Signed   By: JGarald BaldingM.D.   On: 01/17/2015 20:20   Mr Foot Left Wo Contrast  01/18/2015   CLINICAL DATA:  Stepped on staple 3 weeks ago.  EXAM: MRI OF THE LEFT FOREFOOT WITHOUT CONTRAST  TECHNIQUE: Multiplanar, multisequence MR imaging was performed. No intravenous contrast was administered.  COMPARISON:  None.  FINDINGS: Significant patient motion degrading image quality limiting evaluation.  No focal marrow signal abnormality. Mild osteoarthritis of the first MTP joint. No fracture or dislocation. Normal  alignment. No joint effusion.  Soft tissue swelling in the subcutaneous fat along the medial aspect of the first MTP joint extending into the plantar aspect with a 1.5 cm fluid collection with air present within the plantar soft tissue.   There is no other fluid collection or hematoma. The muscles are normal in signal. The visualized flexor and extensor compartment tendons are grossly intact, but degraded by a motion.  IMPRESSION: 1. Soft tissue swelling in the subcutaneous fat along the medial aspect of the first MTP joint extending into the plantar aspect with a 1.5 cm fluid collection with air present within the plantar soft tissues most consistent with infection and abscess. There is no evidence of osteomyelitis of the left forefoot.   Electronically Signed   By: Kathreen Devoid   On: 01/18/2015 10:07    Assessment:   [name] is a 59 y.o. male with poorly controlled DM, PN admitted with diabetic foot infection and abscess after stepping on staple. He is s/p I and D. Cultures with GPC and GNR. Cellulitis is much improved with vanco and zosyn.  MRI with no evidence of osteomyelitis.   Recommendations Cont current abx Will hopefully be able to treat with oral abx pending culture results.  Thank you very much for allowing me to participate in the care of this patient. Please call with questions.   Cheral Marker. Ola Spurr, MD

## 2015-01-19 NOTE — Care Management (Signed)
Admitted to [facility] with the diagnosis of cellulitis and abscess of foot. Lives with wife, Sedalia Muta, [phone]). No home Health. No home oxygen, No skilled facility. Sees Dr. Darleen Crocker, Colbert Ewing and Marvis Moeller at [facility]. In the clinic last Tuesday. Uses a rolling walker for ambulation. Will will transport. Physical therapy evaluation completed. Recommends home with home health/physical therapy. Discussed agency options with Mr & Ms Wlodarczyk. [facility] Gwenette Greet RN MSN Care Management [phone]

## 2015-01-20 LAB — GLUCOSE, CAPILLARY
Glucose-Capillary: 121 mg/dL — ABNORMAL HIGH (ref 65–99)
Glucose-Capillary: 135 mg/dL — ABNORMAL HIGH (ref 65–99)

## 2015-01-20 MED ORDER — AMOXICILLIN-POT CLAVULANATE 875-125 MG PO TABS: 1.0000 | ORAL_TABLET | Freq: Two times a day (BID) | ORAL | Status: AC

## 2015-01-20 NOTE — Discharge Instructions (Signed)

## 2015-01-20 NOTE — Progress Notes (Signed)
Physical Therapy Treatment Patient Details Name: [name] MRN: [id number] DOB: [birth year] Today's Date: 01/20/2015    History of Present Illness presented to ER secondary to progressive redness, inflammation of L LE after stepping on staple (minimal improvement with outpatient antibiotics); admitted with cellulitis and abscess of L LE.  Status post surgical debridement 6/15; heel WBing through L LE with post-op shoe donned.    PT Comments    Per telephone clarification with Dr Ether Griffins, ideally prefers patient to maintain NWB L LE with gait efforts, but okay with heel WBing if patient unable to maintain NWB; okay for trial of heel wedge post-op shoe to L LE to promote increased adherence to heel WBing if necessary. Attempted NWB L LE; patient lacks strength/endurance necessary to maintain NWB for simple, household distances with gait.  BORG 8/10 after 20 feet of mobility. Initiated trial of heel wedge shoe (cleared per Dr Harvie Bridge with marked improvement in safety, stability and overall performance/adherence to [facility] restrictions with use of heel wedge shoe.  Patient subjectively reporting improved comfort/confidence with heel wedge shoe. Continue to strongly emphasize WBing restrictions (reason for and functional implications) with patient/wife; both voice understanding.  Also reinforced with RN to promote carry-over outside of therapy (question full adherence outside of therapy sessions).   Follow Up Recommendations  Home health PT     Equipment Recommendations       Recommendations for Other Services       Precautions / Restrictions Precautions Precautions: Fall Restrictions Weight Bearing Restrictions: Yes Other Position/Activity Restrictions: heel WBing L LE with post-op shoe for transfers; NWB with gait as able (may heel WB if necessary)    Mobility  Bed Mobility Overal bed mobility: Modified Independent                Transfers Overall transfer  level: Needs assistance Equipment used: Rolling walker (2 wheeled) Transfers: Sit to/from Stand Sit to Stand: Min guard;Supervision         General transfer comment: cuing for hand placement to prevent pulling on RW; encouraged to maintain L LE anterior to BOS to minimize WBing adherence  Ambulation/Gait Ambulation/Gait assistance: Min guard Ambulation Distance (Feet): 20 Feet Assistive device: Rolling walker (2 wheeled)       General Gait Details: completed x 2 trials, hop-to gait pattern in attempt to adhere to NWB L LE.  Patient with significant difficulty maintaining NWB; fatigues quickly and lacks cardiopulmonary endurance required to complete simple household distances in this fashion.   Stairs            Wheelchair Mobility    Modified Rankin (Stroke Patients Only)       Balance                                    Cognition                            Exercises Other Exercises Other Exercises: Sit/stand with RW, cga/close sup--emphasis on hand placement and L LE foot placement (anterior to BOS) to maximize safety/indep with movement transitions. Other Exercises: 24' with L heel wedge shoe, RW, cga/close sup--good ability to maintain L LE heel WBing with heel wedge shoe, significant improvement in safety/stabiltiy and overall comfort/confidence with mobility trials (patient subjectively voicing agreement with noted improvements).  continue to emphasize WBing through bilat UEs as able with extensive focus  on L LE heel WBing only to protect incision.  Patient voiced understanding.  Also encouraged use of shoe on R foot with mobility to protect R foot (due to increased use/force of impact created by L LE WBing restrictions). Patient voiced understanding.    General Comments        Pertinent Vitals/Pain Pain Score: 5  Pain Location: L foot Pain Descriptors / Indicators: Aching Pain Intervention(s): Limited activity within patient's  tolerance;Repositioned;Monitored during session    Home Living                      Prior Function            PT Goals (current goals can now be found in the care plan section) Acute Rehab PT Goals Patient Stated Goal: "to return home" PT Goal Formulation: With patient Time For Goal Achievement: 02/02/15 Potential to Achieve Goals: Good Progress towards PT goals: Progressing toward goals    Frequency  Min 2X/week    PT Plan Current plan remains appropriate    Co-evaluation             End of Session Equipment Utilized During Treatment: Gait belt (L heel wedge shoe) Activity Tolerance: Patient tolerated treatment well Patient left: in bed;with call bell/phone within reach;with bed alarm set     Time: 4259-5638 PT Time Calculation (min) (ACUTE ONLY): 26 min  Charges:  $Gait Training: 8-22 mins $Therapeutic Activity: 8-22 mins                    G Codes:      Kemisha Bonnette H. Manson Passey, PT, DPT 01/20/2015, 10:03 AM [phone]

## 2015-01-20 NOTE — Progress Notes (Signed)
Pt d/c home; d/c instructions reviewed w/ pt; pt understanding was verbalized; IV removed catheter in tact, gauze dressing applied; all pt questions answered; pt left unit via wheelchair accompanied by staff 

## 2015-01-20 NOTE — Progress Notes (Signed)
Daily Progress Note   Subjective  - 2 Days Post-Op  I&D left foot.  Objective Filed Vitals:   01/19/15 2113 01/19/15 2348 01/20/15 0056 01/20/15 0809  BP: 106/68 105/46  113/77  Pulse: 72 70  88  Temp:  97.6 F (36.4 C)  98 F (36.7 C)  TempSrc:  Oral  Axillary  Resp:  16  18  Height:      Weight:   152.771 kg (336 lb 12.8 oz)   SpO2:  94%  96%    Physical Exam: Incision is coapting nicely. Just a scant amount of clear serous drainage. Cellulitis to the left leg is markedly improved at this time.  Laboratory CBC    Component Value Date/Time   WBC 12.3* 01/18/2015 0518   WBC 8.4 03/22/2014 1328   WBC 8.0 12/28/2013 1546   HGB 12.6* 01/18/2015 0518   HGB 13.5 03/22/2014 1328   HCT 39.6* 01/18/2015 0518   HCT 44.0 11/29/2014 1343   PLT 271 01/18/2015 0518   PLT 171 03/22/2014 1328    BMET    Component Value Date/Time   NA 133* 01/18/2015 0518   NA 137 12/28/2013 1546   NA 136 10/21/2013 0616   K 4.2 01/18/2015 0518   K 4.2 10/21/2013 0616   CL 101 01/18/2015 0518   CL 100 10/21/2013 0616   CO2 25 01/18/2015 0518   CO2 34* 10/21/2013 0616   GLUCOSE 139* 01/18/2015 0518   GLUCOSE 85 12/28/2013 1546   GLUCOSE 103* 10/21/2013 0616   BUN 23* 01/18/2015 0518   BUN 10 12/28/2013 1546   BUN 16 10/21/2013 0616   CREATININE 1.63* 01/18/2015 0518   CREATININE 1.42* 10/21/2013 0616   CALCIUM 8.0* 01/18/2015 0518   CALCIUM 9.1 10/21/2013 0616   GFRNONAA 45* 01/18/2015 0518   GFRNONAA 55* 10/21/2013 0616   GFRAA 52* 01/18/2015 0518   GFRAA >60 10/21/2013 0616    Assessment/Planning: He status post I&D of abscess left foot.   Dressing changes to be performed with. I'll see him in the outpatient clinic in 1 week. Continue with oral antibiotics per infectious disease. Okay to DC from podiatry standpoint.    Gwyneth Revels A  01/20/2015, 10:13 AM

## 2015-01-20 NOTE — Discharge Summary (Signed)
[facility] - [place] at [facility]   PATIENT NAME: [name]    MR#:  [id number]  DATE OF BIRTH:  [birth year]  DATE OF ADMISSION:  01/17/2015 ADMITTING PHYSICIAN: Oralia Manis, MD  DATE OF DISCHARGE: 01/20/2015 11:36 AM  PRIMARY CARE PHYSICIAN: Clydie Braun, MD  ADMISSION DIAGNOSIS:  Abscess [L02.91] Infection [B99.9]  DISCHARGE DIAGNOSIS:  Principal Problem:   Cellulitis and abscess of foot Active Problems:   Hyperlipidemia   Diabetes mellitus, type 2   CAD (coronary artery disease)   HTN (hypertension)   Peripheral neuropathy   CKD (chronic kidney disease), stage III   Chronic combined systolic and diastolic CHF (congestive heart failure)   SECONDARY DIAGNOSIS:   Past Medical History  Diagnosis Date  . COPD (chronic obstructive pulmonary disease)   . Hyperlipidemia   . Hypertension   . Type II diabetes mellitus   . Peripheral neuropathy   . Tobacco abuse     a. 46 yrs, up to 2 ppd, changed to e-cigarette 05/2013.  . Cardiomyopathy     a. 10/2013 Echo: EF 35-40%, mild MR/TR;  b. 11/2013 Lexi MV: EF 46%, fixed apical defect with small distal antsept ischemia->overall low-risk->med Rx.  . Secondary erythrocytosis     a. followed by heme-onc with periodic phlebotomy.  . CKD (chronic kidney disease), stage III     a. Creat 1.42 (10/2013).  . Ischemic toe     a. 10/2013 L fifth toe - felt to be either 2/2 embolic plaque vs erythrocytosis - seen by vascular surgery (Dew) ->conservative rx.  Marland Kitchen LBBB (left bundle branch block)   . Collagen vascular disease   . CHF (congestive heart failure)    HOSPITAL COURSE:  59 y.o. male who admitted with significant left lower extremity cellulitis and foot abscess.  We see Dr. Ellender Hose written history and physical for further details. Podiatry consultation was obtained with Dr. Gwyneth Revels who recommended incision and drainage performed by him on 15th of June.  Subsequently, patient was doing much  better.  His wound was healing and wound VAC was draining serosanguineous discharge.  Infectious disease consultation was obtained with Dr. Sampson Goon who recommended changing his antibiotic to oral Augmentin based on culture sensitivity.  He has been getting daily dressing changes by Dr. Ether Griffins and wound seemed to be healing well.   He is being discharged home in stable condition and he is agreeable with discharge plans.  DISCHARGE CONDITIONS:   Stable  CONSULTS OBTAINED:  Treatment Team:  Gwyneth Revels, DPM Clydie Braun, MD  DRUG ALLERGIES:  No Known Allergies  DISCHARGE MEDICATIONS:   Discharge Medication List as of 01/20/2015 10:44 AM    START taking these medications   Details  amoxicillin-clavulanate (AUGMENTIN) 875-125 MG per tablet Take 1 tablet by mouth 2 (two) times daily., Starting 01/20/2015, Until Fri 02/03/15, Normal      CONTINUE these medications which have NOT CHANGED   Details  albuterol (PROVENTIL HFA;VENTOLIN HFA) 108 (90 BASE) MCG/ACT inhaler Inhale 2 puffs into the lungs every 4 (four) hours as needed for wheezing or shortness of breath., Until Discontinued, Historical Med    aspirin 81 MG tablet Take 81 mg by mouth daily., Until Discontinued, Historical Med    B Complex-C (B-COMPLEX WITH VITAMIN C) tablet Take 1 tablet by mouth daily., Until Discontinued, Historical Med    Cholecalciferol (VITAMIN D3) 50000 UNITS CAPS Take 1 capsule by mouth every 30 (thirty) days., Until Discontinued, Historical Med    clopidogrel (PLAVIX)  75 MG tablet Take 75 mg by mouth daily with breakfast., Until Discontinued, Historical Med    isosorbide mononitrate (IMDUR) 30 MG 24 hr tablet Take 0.5 tablets (15 mg total) by mouth daily., Starting 12/28/2013, Until Discontinued, Normal    linagliptin (TRADJENTA) 5 MG TABS tablet Take 5 mg by mouth daily., Until Discontinued, Historical Med    lisinopril (PRINIVIL,ZESTRIL) 20 MG tablet Take 10 mg by mouth daily., Until  Discontinued, Historical Med    loratadine (CLARITIN) 10 MG tablet Take 10 mg by mouth daily., Until Discontinued, Historical Med    lovastatin (MEVACOR) 40 MG tablet Take 40 mg by mouth at bedtime., Until Discontinued, Historical Med    metoprolol tartrate (LOPRESSOR) 25 MG tablet Take 1 tablet (25 mg total) by mouth 2 (two) times daily., Starting 09/01/2014, Until Discontinued, Normal    nitroGLYCERIN (NITROSTAT) 0.4 MG SL tablet Place 1 tablet (0.4 mg total) under the tongue every 5 (five) minutes as needed for chest pain., Starting 12/28/2013, Until Discontinued, Normal    pregabalin (LYRICA) 100 MG capsule Take 100 mg by mouth 3 (three) times daily., Until Discontinued, Historical Med    sertraline (ZOLOFT) 50 MG tablet Take 50 mg by mouth daily. , Starting 12/30/2014, Until Discontinued, Historical Med    traZODone (DESYREL) 150 MG tablet TAKE 1 TABLET BY MOUTH AT BEDTIME., Normal    diazepam (VALIUM) 2 MG tablet Take 1 tablet (2 mg total) by mouth every 8 (eight) hours as needed for muscle spasms., Starting 01/09/2015, Until Tue 01/09/16, Print    oxyCODONE (OXY IR/ROXICODONE) 5 MG immediate release tablet Take 5 mg by mouth every 4 (four) hours as needed for severe pain., Until Discontinued, Historical Med        DIET:  Cardiac diet  DISCHARGE CONDITION:  Good  ACTIVITY:  Activity as tolerated  OXYGEN:  Home Oxygen: No.   Oxygen Delivery: room air  DISCHARGE LOCATION:  home with home health - daily for wound care and dressing changes.  Dry dressing  If you experience worsening of your admission symptoms, develop shortness of breath, life threatening emergency, suicidal or homicidal thoughts you must seek medical attention immediately by calling 911 or calling your MD immediately  if symptoms less severe.  You Must read complete instructions/literature along with all the possible adverse reactions/side effects for all the Medicines you take and that have been prescribed to  you. Take any new Medicines after you have completely understood and accpet all the possible adverse reactions/side effects.   Please note  You were cared for by a hospitalist during your hospital stay. If you have any questions about your discharge medications or the care you received while you were in the hospital after you are discharged, you can call the unit and asked to speak with the hospitalist on call if the hospitalist that took care of you is not available. Once you are discharged, your primary care physician will handle any further medical issues. Please note that NO REFILLS for any discharge medications will be authorized once you are discharged, as it is imperative that you return to your primary care physician (or establish a relationship with a primary care physician if you do not have one) for your aftercare needs so that they can reassess your need for medications and monitor your lab values.   On the day of Discharge:   VITAL SIGNS:  Blood pressure 113/77, pulse 88, temperature 98 F (36.7 C), temperature source Axillary, resp. rate 18, height 6' (1.829 m),  weight 152.771 kg (336 lb 12.8 oz), SpO2 96 %.  I/O:   Intake/Output Summary (Last 24 hours) at 01/20/15 1158 Last data filed at 01/20/15 0900  Gross per 24 hour  Intake    865 ml  Output    500 ml  Net    365 ml    PHYSICAL EXAMINATION:  GENERAL:  59 y.o.-year-old patient lying in the bed with no acute distress.  EYES: Pupils equal, round, reactive to light and accommodation. No scleral icterus. Extraocular muscles intact.  HEENT: Head atraumatic, normocephalic. Oropharynx and nasopharynx clear.  NECK:  Supple, no jugular venous distention. No thyroid enlargement, no tenderness.  LUNGS: Normal breath sounds bilaterally, no wheezing, rales,rhonchi or crepitation. No use of accessory muscles of respiration.  CARDIOVASCULAR: S1, S2 normal. No murmurs, rubs, or gallops.  ABDOMEN: Soft, non-tender, non-distended.  Bowel sounds present. No organomegaly or mass.  EXTREMITIES: No pedal edema, cyanosis, or clubbing.  NEUROLOGIC: Cranial nerves II through XII are intact. Muscle strength 5/5 in all extremities. Sensation intact. Gait not checked.  PSYCHIATRIC: The patient is alert and oriented x 3.  SKIN: Incision is coapting nicely. Just a scant amount of clear serous drainage. Cellulitis to the left leg is markedly improved at this time.  DATA REVIEW:   CBC  Recent Labs Lab 01/18/15 0518  WBC 12.3*  HGB 12.6*  HCT 39.6*  PLT 271    Chemistries   Recent Labs Lab 01/18/15 0518  NA 133*  K 4.2  CL 101  CO2 25  GLUCOSE 139*  BUN 23*  CREATININE 1.63*  CALCIUM 8.0*  AST 30  ALT 22  ALKPHOS 135*  BILITOT 0.7   Microbiology Results  Results for orders placed or performed during the hospital encounter of 01/17/15  Blood culture (routine x 2)     Status: None (Preliminary result)   Collection Time: 01/17/15  5:25 PM  Result Value Ref Range Status   Specimen Description BLOOD  Final   Special Requests NONE  Final   Culture NO GROWTH 3 DAYS  Final   Report Status PENDING  Incomplete  Blood culture (routine x 2)     Status: None (Preliminary result)   Collection Time: 01/17/15  5:25 PM  Result Value Ref Range Status   Specimen Description BLOOD  Final   Special Requests NONE  Final   Culture NO GROWTH 3 DAYS  Final   Report Status PENDING  Incomplete  Wound culture     Status: None (Preliminary result)   Collection Time: 01/17/15 10:07 PM  Result Value Ref Range Status   Specimen Description FOOT  Final   Special Requests NONE  Final   Gram Stain   Final    FEW WBC SEEN MANY GRAM POSITIVE COCCI IN CLUSTERS MANY GRAM NEGATIVE RODS    Culture   Final    HEAVY GROWTH STAPHYLOCOCCUS AUREUS HEAVY GROWTH GRAM POSITIVE COCCI IDENTIFICATION AND SUSCEPTIBILITIES TO FOLLOW WITH NORMAL SKIN FLORA    Report Status PENDING  Incomplete  Anaerobic culture     Status: None (Preliminary  result)   Collection Time: 01/18/15  4:45 PM  Result Value Ref Range Status   Specimen Description WOUND  Final   Special Requests NONE  Final   Gram Stain NO ANAEROBES ISOLATED  Final   Culture PENDING  Incomplete   Report Status PENDING  Incomplete    Management plans discussed with the patient, family and they are in agreement.  CODE STATUS: Full Code  TOTAL  TIME TAKING CARE OF THIS PATIENT: 55 minutes.    [facility], Attila Mccarthy M.D on 01/20/2015 at 11:58 AM  Between 7am to 6pm - Pager - [phone]  After 6pm go to [email or web address] - password [email or web address]  [place] [facility]  Office  [phone]  CC: Primary care physician; Clydie Braun, MD Gwyneth Revels, DPM

## 2015-01-21 LAB — WOUND CULTURE

## 2015-01-22 LAB — CULTURE, BLOOD (ROUTINE X 2)
Culture: NO GROWTH
Culture: NO GROWTH

## 2015-01-23 LAB — WOUND CULTURE

## 2015-01-24 LAB — ANAEROBIC CULTURE

## 2015-02-03 DIAGNOSIS — L089 Local infection of the skin and subcutaneous tissue, unspecified: Secondary | ICD-10-CM | POA: Insufficient documentation

## 2015-02-03 HISTORY — DX: Local infection of the skin and subcutaneous tissue, unspecified: L08.9

## 2015-02-21 ENCOUNTER — Inpatient Hospital Stay: Payer: Medicare HMO | Admitting: Internal Medicine

## 2015-02-21 ENCOUNTER — Inpatient Hospital Stay: Payer: Medicare HMO | Attending: Internal Medicine

## 2015-02-21 ENCOUNTER — Inpatient Hospital Stay: Payer: Medicare HMO

## 2015-03-07 ENCOUNTER — Encounter
Admission: RE | Admit: 2015-03-07 | Discharge: 2015-03-07 | Disposition: A | Discharge status: Home / Self Care | Payer: Medicare HMO | Source: Ambulatory Visit | Attending: Podiatry | Admitting: Podiatry

## 2015-03-07 DIAGNOSIS — I2510 Atherosclerotic heart disease of native coronary artery without angina pectoris: Secondary | ICD-10-CM | POA: Diagnosis not present

## 2015-03-07 DIAGNOSIS — L97529 Non-pressure chronic ulcer of other part of left foot with unspecified severity: Secondary | ICD-10-CM | POA: Diagnosis not present

## 2015-03-07 DIAGNOSIS — Z6841 Body Mass Index (BMI) 40.0 and over, adult: Secondary | ICD-10-CM | POA: Diagnosis not present

## 2015-03-07 DIAGNOSIS — F17210 Nicotine dependence, cigarettes, uncomplicated: Secondary | ICD-10-CM | POA: Diagnosis not present

## 2015-03-07 DIAGNOSIS — I129 Hypertensive chronic kidney disease with stage 1 through stage 4 chronic kidney disease, or unspecified chronic kidney disease: Secondary | ICD-10-CM | POA: Diagnosis not present

## 2015-03-07 DIAGNOSIS — T8131XA Disruption of external operation (surgical) wound, not elsewhere classified, initial encounter: Secondary | ICD-10-CM | POA: Diagnosis present

## 2015-03-07 DIAGNOSIS — L97522 Non-pressure chronic ulcer of other part of left foot with fat layer exposed: Secondary | ICD-10-CM | POA: Diagnosis not present

## 2015-03-07 DIAGNOSIS — Z79899 Other long term (current) drug therapy: Secondary | ICD-10-CM | POA: Diagnosis not present

## 2015-03-07 DIAGNOSIS — X58XXXA Exposure to other specified factors, initial encounter: Secondary | ICD-10-CM | POA: Diagnosis not present

## 2015-03-07 DIAGNOSIS — N183 Chronic kidney disease, stage 3 (moderate): Secondary | ICD-10-CM | POA: Diagnosis not present

## 2015-03-07 DIAGNOSIS — I423 Endomyocardial (eosinophilic) disease: Secondary | ICD-10-CM | POA: Diagnosis not present

## 2015-03-07 DIAGNOSIS — E11621 Type 2 diabetes mellitus with foot ulcer: Secondary | ICD-10-CM | POA: Diagnosis present

## 2015-03-07 DIAGNOSIS — E1140 Type 2 diabetes mellitus with diabetic neuropathy, unspecified: Secondary | ICD-10-CM | POA: Diagnosis not present

## 2015-03-07 DIAGNOSIS — E784 Other hyperlipidemia: Secondary | ICD-10-CM | POA: Diagnosis not present

## 2015-03-07 DIAGNOSIS — E1122 Type 2 diabetes mellitus with diabetic chronic kidney disease: Secondary | ICD-10-CM | POA: Diagnosis not present

## 2015-03-07 DIAGNOSIS — Z7902 Long term (current) use of antithrombotics/antiplatelets: Secondary | ICD-10-CM | POA: Diagnosis not present

## 2015-03-07 DIAGNOSIS — Z79891 Long term (current) use of opiate analgesic: Secondary | ICD-10-CM | POA: Diagnosis not present

## 2015-03-07 DIAGNOSIS — I447 Left bundle-branch block, unspecified: Secondary | ICD-10-CM | POA: Diagnosis not present

## 2015-03-07 LAB — CBC
HEMATOCRIT: 41.8 % (ref 40.0–52.0)
Hemoglobin: 13 g/dL (ref 13.0–18.0)
MCH: 23.2 pg — ABNORMAL LOW (ref 26.0–34.0)
MCHC: 31.2 g/dL — ABNORMAL LOW (ref 32.0–36.0)
MCV: 74.3 fL — ABNORMAL LOW (ref 80.0–100.0)
Platelets: 180 10*3/uL (ref 150–440)
RBC: 5.62 MIL/uL (ref 4.40–5.90)
RDW: 20.8 % — AB (ref 11.5–14.5)
WBC: 9.1 10*3/uL (ref 3.8–10.6)

## 2015-03-07 LAB — DIFFERENTIAL
BASOS ABS: 0.1 10*3/uL (ref 0–0.1)
BASOS PCT: 1 %
Eosinophils Absolute: 0.9 10*3/uL — ABNORMAL HIGH (ref 0–0.7)
Eosinophils Relative: 10 %
Lymphocytes Relative: 26 %
Lymphs Abs: 2.3 10*3/uL (ref 1.0–3.6)
MONOS PCT: 7 %
Monocytes Absolute: 0.6 10*3/uL (ref 0.2–1.0)
NEUTROS ABS: 5.2 10*3/uL (ref 1.4–6.5)
NEUTROS PCT: 56 %

## 2015-03-07 LAB — BASIC METABOLIC PANEL
Anion gap: 5 (ref 5–15)
BUN: 10 mg/dL (ref 6–20)
CALCIUM: 8.6 mg/dL — AB (ref 8.9–10.3)
CO2: 28 mmol/L (ref 22–32)
CREATININE: 1.23 mg/dL (ref 0.61–1.24)
Chloride: 104 mmol/L (ref 101–111)
GFR calc Af Amer: >60 mL/min (ref >60)
GFR calc non Af Amer: >60 mL/min (ref >60)
GLUCOSE: 113 mg/dL — AB (ref 65–99)
Potassium: 4.1 mmol/L (ref 3.5–5.1)
SODIUM: 137 mmol/L (ref 135–145)

## 2015-03-07 NOTE — Patient Instructions (Signed)
  Your procedure is scheduled on: Friday March 10, 2015. Report to Same Day Surgery. To find out your arrival time please call [phone] between 1PM - 3PM on Thursday March 09, 2015.  Remember: Instructions that are not followed completely may result in serious medical risk, up to and including death, or upon the discretion of your surgeon and anesthesiologist your surgery may need to be rescheduled.    __x__ 1. Do not eat food or drink liquids after midnight. No gum chewing or hard candies.     ____ 2. No Alcohol for 24 hours before or after surgery.   ____ 3. Bring all medications with you on the day of surgery if instructed.    __x__ 4. Notify your doctor if there is any change in your medical condition     (cold, fever, infections).     Do not wear jewelry, make-up, hairpins, clips or nail polish.  Do not wear lotions, powders, or perfumes. You may wear deodorant.  Do not shave 48 hours prior to surgery. Men may shave face and neck.  Do not bring valuables to the hospital.    [facility] is not responsible for any belongings or valuables.               Contacts, dentures or bridgework may not be worn into surgery.  Leave your suitcase in the car. After surgery it may be brought to your room.  For patients admitted to the hospital, discharge time is determined by your  treatment team.   Patients discharged the day of surgery will not be allowed to drive home.    Please read over the following fact sheets that you were given:   [facility] Preparing for Surgery  __x__ Take these medicines the morning of surgery with A SIP OF WATER:    1. isosorbide mononitrate (IMDUR)  2. lisinopril (PRINIVIL,ZESTRIL  3. metoprolol tartrate (LOPRESSOR)  4.pregabalin (LYRICA)  ____ Fleet Enema (as directed)   _x___ Use CHG Soap as directed  _x___ Use Albuterol inhaler on the day of surgery and bring to hospital. ____ Stop metformin 2 days prior to surgery    ____ Take 1/2 of usual  insulin dose the night before surgery and none on the morning of surgery.   _x___ Stopped Plavix/aspirin on July 30th, 2016.  ____ Stop Anti-inflammatories on does not apply.  OK to take Tylenol to take for pain.   ____ Stop supplements until after surgery.    ____ Bring C-Pap to the hospital.

## 2015-03-10 ENCOUNTER — Encounter
Admission: RE | Disposition: A | Discharge status: Home / Self Care | Payer: Self-pay | Source: Ambulatory Visit | Attending: Podiatry

## 2015-03-10 ENCOUNTER — Ambulatory Visit: Payer: Medicare HMO | Admitting: *Deleted

## 2015-03-10 ENCOUNTER — Ambulatory Visit
Admission: RE | Admit: 2015-03-10 | Discharge: 2015-03-10 | Disposition: A | Discharge status: Home / Self Care | Payer: Medicare HMO | Source: Ambulatory Visit | Attending: Podiatry | Admitting: Podiatry

## 2015-03-10 ENCOUNTER — Encounter: Payer: Self-pay | Admitting: *Deleted

## 2015-03-10 DIAGNOSIS — T8131XA Disruption of external operation (surgical) wound, not elsewhere classified, initial encounter: Secondary | ICD-10-CM | POA: Insufficient documentation

## 2015-03-10 DIAGNOSIS — E1122 Type 2 diabetes mellitus with diabetic chronic kidney disease: Secondary | ICD-10-CM | POA: Insufficient documentation

## 2015-03-10 DIAGNOSIS — L97529 Non-pressure chronic ulcer of other part of left foot with unspecified severity: Secondary | ICD-10-CM | POA: Insufficient documentation

## 2015-03-10 DIAGNOSIS — I2510 Atherosclerotic heart disease of native coronary artery without angina pectoris: Secondary | ICD-10-CM | POA: Insufficient documentation

## 2015-03-10 DIAGNOSIS — I447 Left bundle-branch block, unspecified: Secondary | ICD-10-CM | POA: Insufficient documentation

## 2015-03-10 DIAGNOSIS — E784 Other hyperlipidemia: Secondary | ICD-10-CM | POA: Insufficient documentation

## 2015-03-10 DIAGNOSIS — E1140 Type 2 diabetes mellitus with diabetic neuropathy, unspecified: Secondary | ICD-10-CM | POA: Insufficient documentation

## 2015-03-10 DIAGNOSIS — E11621 Type 2 diabetes mellitus with foot ulcer: Secondary | ICD-10-CM | POA: Diagnosis not present

## 2015-03-10 DIAGNOSIS — Z79891 Long term (current) use of opiate analgesic: Secondary | ICD-10-CM | POA: Insufficient documentation

## 2015-03-10 DIAGNOSIS — X58XXXA Exposure to other specified factors, initial encounter: Secondary | ICD-10-CM | POA: Insufficient documentation

## 2015-03-10 DIAGNOSIS — Z6841 Body Mass Index (BMI) 40.0 and over, adult: Secondary | ICD-10-CM | POA: Insufficient documentation

## 2015-03-10 DIAGNOSIS — F17210 Nicotine dependence, cigarettes, uncomplicated: Secondary | ICD-10-CM | POA: Insufficient documentation

## 2015-03-10 DIAGNOSIS — Z79899 Other long term (current) drug therapy: Secondary | ICD-10-CM | POA: Insufficient documentation

## 2015-03-10 DIAGNOSIS — Z7902 Long term (current) use of antithrombotics/antiplatelets: Secondary | ICD-10-CM | POA: Insufficient documentation

## 2015-03-10 DIAGNOSIS — I129 Hypertensive chronic kidney disease with stage 1 through stage 4 chronic kidney disease, or unspecified chronic kidney disease: Secondary | ICD-10-CM | POA: Insufficient documentation

## 2015-03-10 DIAGNOSIS — L97522 Non-pressure chronic ulcer of other part of left foot with fat layer exposed: Secondary | ICD-10-CM | POA: Insufficient documentation

## 2015-03-10 DIAGNOSIS — I423 Endomyocardial (eosinophilic) disease: Secondary | ICD-10-CM | POA: Insufficient documentation

## 2015-03-10 DIAGNOSIS — N183 Chronic kidney disease, stage 3 (moderate): Secondary | ICD-10-CM | POA: Insufficient documentation

## 2015-03-10 HISTORY — PX: WOUND EXPLORATION: SHX6188

## 2015-03-10 LAB — GLUCOSE, CAPILLARY: Glucose-Capillary: 169 mg/dL — ABNORMAL HIGH (ref 65–99)

## 2015-03-10 SURGERY — WOUND EXPLORATION
Anesthesia: General | Laterality: Left

## 2015-03-10 MED ORDER — ONDANSETRON HCL 4 MG PO TABS: 4.0000 mg | ORAL_TABLET | Freq: Four times a day (QID) | ORAL | Status: DC | PRN

## 2015-03-10 MED ORDER — LIDOCAINE-EPINEPHRINE 1 %-1:100000 IJ SOLN
INTRAMUSCULAR | Status: AC
  Filled 2015-03-10: qty 1

## 2015-03-10 MED ORDER — METHYLENE BLUE 1 % INJ SOLN
INTRAMUSCULAR | Status: DC | PRN
  Administered 2015-03-10: 1 mL

## 2015-03-10 MED ORDER — ONDANSETRON HCL 4 MG/2ML IJ SOLN: 4.0000 mg | Freq: Once | INTRAMUSCULAR | Status: DC | PRN

## 2015-03-10 MED ORDER — HYDROMORPHONE HCL 1 MG/ML IJ SOLN
0.2500 mg | INTRAMUSCULAR | Status: DC | PRN
  Administered 2015-03-10 (×4): 0.25 mg via INTRAVENOUS

## 2015-03-10 MED ORDER — ONDANSETRON HCL 4 MG/2ML IJ SOLN: 4.0000 mg | Freq: Four times a day (QID) | INTRAMUSCULAR | Status: DC | PRN

## 2015-03-10 MED ORDER — CEFAZOLIN SODIUM-DEXTROSE 2-3 GM-% IV SOLR
INTRAVENOUS | Status: AC
  Administered 2015-03-10: 2 g via INTRAVENOUS
  Filled 2015-03-10: qty 50

## 2015-03-10 MED ORDER — FAMOTIDINE 20 MG PO TABS
ORAL_TABLET | ORAL | Status: AC
  Administered 2015-03-10: 20 mg via ORAL
  Filled 2015-03-10: qty 1

## 2015-03-10 MED ORDER — PHENYLEPHRINE HCL 10 MG/ML IJ SOLN
INTRAMUSCULAR | Status: DC | PRN
  Administered 2015-03-10 (×2): 100 ug via INTRAVENOUS

## 2015-03-10 MED ORDER — FAMOTIDINE 20 MG PO TABS
20.0000 mg | ORAL_TABLET | Freq: Once | ORAL | Status: AC
  Administered 2015-03-10: 20 mg via ORAL

## 2015-03-10 MED ORDER — METHYLENE BLUE 1 % INJ SOLN
INTRAMUSCULAR | Status: AC
  Filled 2015-03-10: qty 10

## 2015-03-10 MED ORDER — LIDOCAINE-EPINEPHRINE 1 %-1:100000 IJ SOLN
INTRAMUSCULAR | Status: DC | PRN
  Administered 2015-03-10: 5 mL

## 2015-03-10 MED ORDER — CEFAZOLIN SODIUM-DEXTROSE 2-3 GM-% IV SOLR
2.0000 g | Freq: Once | INTRAVENOUS | Status: AC
  Administered 2015-03-10: 2 g via INTRAVENOUS

## 2015-03-10 MED ORDER — BUPIVACAINE HCL (PF) 0.5 % IJ SOLN
INTRAMUSCULAR | Status: DC | PRN
  Administered 2015-03-10: 5 mL

## 2015-03-10 MED ORDER — HYDROCODONE-ACETAMINOPHEN 5-325 MG PO TABS: 1.0000 | ORAL_TABLET | Freq: Four times a day (QID) | ORAL | Status: DC | PRN

## 2015-03-10 MED ORDER — LIDOCAINE HCL (PF) 1 % IJ SOLN
INTRAMUSCULAR | Status: AC
  Filled 2015-03-10: qty 30

## 2015-03-10 MED ORDER — SODIUM CHLORIDE 0.9 % IV SOLN
INTRAVENOUS | Status: DC
  Administered 2015-03-10: 07:00:00 via INTRAVENOUS

## 2015-03-10 MED ORDER — BUPIVACAINE HCL (PF) 0.5 % IJ SOLN
INTRAMUSCULAR | Status: AC
  Filled 2015-03-10: qty 30

## 2015-03-10 MED ORDER — EPHEDRINE SULFATE 50 MG/ML IJ SOLN
INTRAMUSCULAR | Status: DC | PRN
  Administered 2015-03-10 (×3): 10 mg via INTRAVENOUS

## 2015-03-10 MED ORDER — PROPOFOL INFUSION 10 MG/ML OPTIME
INTRAVENOUS | Status: DC | PRN
  Administered 2015-03-10: 120 ug/kg/min via INTRAVENOUS

## 2015-03-10 MED ORDER — OXYCODONE-ACETAMINOPHEN 5-325 MG PO TABS: 1.0000 | ORAL_TABLET | ORAL | Status: DC | PRN

## 2015-03-10 MED ORDER — FENTANYL CITRATE (PF) 100 MCG/2ML IJ SOLN
INTRAMUSCULAR | Status: DC | PRN
  Administered 2015-03-10: 25 ug via INTRAVENOUS

## 2015-03-10 MED ORDER — MIDAZOLAM HCL 2 MG/2ML IJ SOLN
INTRAMUSCULAR | Status: DC | PRN
  Administered 2015-03-10: 2 mg via INTRAVENOUS

## 2015-03-10 MED ORDER — LIDOCAINE HCL (CARDIAC) 20 MG/ML IV SOLN
INTRAVENOUS | Status: DC | PRN
  Administered 2015-03-10: 60 mg via INTRAVENOUS

## 2015-03-10 MED ORDER — HYDROMORPHONE HCL 1 MG/ML IJ SOLN
INTRAMUSCULAR | Status: AC
  Filled 2015-03-10: qty 1

## 2015-03-10 MED ORDER — SODIUM CHLORIDE 0.9 % IR SOLN
Status: DC | PRN
  Administered 2015-03-10: 500 mL

## 2015-03-10 SURGICAL SUPPLY — 61 items
BAG COUNTER SPONGE EZ (MISCELLANEOUS) IMPLANT
BANDAGE ELASTIC 4 CLIP ST LF (GAUZE/BANDAGES/DRESSINGS) ×2 IMPLANT
BANDAGE STRETCH 3X4.1 STRL (GAUZE/BANDAGES/DRESSINGS) ×2 IMPLANT
BLADE OSC/SAGITTAL MD 5.5X18 (BLADE) IMPLANT
BLADE OSCILLATING/SAGITTAL (BLADE)
BLADE SURG 15 STRL LF DISP TIS (BLADE) ×1 IMPLANT
BLADE SURG 15 STRL SS (BLADE) ×1
BLADE SW THK.38XMED LNG THN (BLADE) IMPLANT
BNDG COHESIVE 4X5 TAN STRL (GAUZE/BANDAGES/DRESSINGS) ×2 IMPLANT
BNDG COHESIVE 6X5 TAN STRL LF (GAUZE/BANDAGES/DRESSINGS) IMPLANT
BNDG ESMARK 4X12 TAN STRL LF (GAUZE/BANDAGES/DRESSINGS) IMPLANT
BNDG GAUZE 4.5X4.1 6PLY STRL (MISCELLANEOUS) ×4 IMPLANT
CANISTER SUCT 1200ML W/VALVE (MISCELLANEOUS) ×2 IMPLANT
CANISTER SUCT 3000ML (MISCELLANEOUS) ×2 IMPLANT
CUFF TOURN 18 STER (MISCELLANEOUS) ×2 IMPLANT
CUFF TOURN DUAL PL 12 NO SLV (MISCELLANEOUS) IMPLANT
DRAPE FLUOR MINI C-ARM 54X84 (DRAPES) IMPLANT
DRAPE XRAY CASSETTE 23X24 (DRAPES) IMPLANT
DURAPREP 26ML APPLICATOR (WOUND CARE) IMPLANT
GAUZE PACKING 1/4X5YD (GAUZE/BANDAGES/DRESSINGS) IMPLANT
GAUZE PACKING IODOFORM 1X5 (MISCELLANEOUS) IMPLANT
GAUZE PETRO XEROFOAM 1X8 (MISCELLANEOUS) ×2 IMPLANT
GAUZE SPONGE 4X4 12PLY STRL (GAUZE/BANDAGES/DRESSINGS) ×2 IMPLANT
GAUZE STRETCH 2X75IN STRL (MISCELLANEOUS) IMPLANT
GLOVE BIO SURGEON STRL SZ7.5 (GLOVE) ×4 IMPLANT
GLOVE INDICATOR 8.0 STRL GRN (GLOVE) ×4 IMPLANT
GOWN STRL REUS W/ TWL LRG LVL3 (GOWN DISPOSABLE) ×2 IMPLANT
GOWN STRL REUS W/TWL LRG LVL3 (GOWN DISPOSABLE) ×2
HANDPIECE SUCTION TUBG SURGILV (MISCELLANEOUS) IMPLANT
HANDPIECE VERSAJET DEBRIDEMENT (MISCELLANEOUS) ×2 IMPLANT
IV NS 1000ML (IV SOLUTION)
IV NS 1000ML BAXH (IV SOLUTION) IMPLANT
IV SODIUM CHL 0.9% 500ML (IV SOLUTION) ×2 IMPLANT
LABEL OR SOLS (LABEL) IMPLANT
NDL SAFETY 25GX1.5 (NEEDLE) ×6 IMPLANT
NEEDLE FILTER BLUNT 18X 1/2SAF (NEEDLE) ×1
NEEDLE FILTER BLUNT 18X1 1/2 (NEEDLE) ×1 IMPLANT
NS IRRIG 500ML POUR BTL (IV SOLUTION) ×2 IMPLANT
PACK EXTREMITY ARMC (MISCELLANEOUS) ×2 IMPLANT
PAD ABD DERMACEA PRESS 5X9 (GAUZE/BANDAGES/DRESSINGS) ×2 IMPLANT
PAD GROUND ADULT SPLIT (MISCELLANEOUS) ×2 IMPLANT
PENCIL ELECTRO HAND CTR (MISCELLANEOUS) ×2 IMPLANT
RASP SM TEAR CROSS CUT (RASP) IMPLANT
SCRUB POVIDONE IODINE 4 OZ (MISCELLANEOUS) ×2 IMPLANT
SKIN SCRUB PREP TRAY ×2 IMPLANT
SOL .9 NS 3000ML IRR  AL (IV SOLUTION)
SOL .9 NS 3000ML IRR UROMATIC (IV SOLUTION) IMPLANT
STOCKINETTE IMPERVIOUS 9X36 MD (GAUZE/BANDAGES/DRESSINGS) ×2 IMPLANT
STRAP SAFETY BODY (MISCELLANEOUS) ×2 IMPLANT
SUT ETHILON 2 0 FS 18 (SUTURE) ×8 IMPLANT
SUT ETHILON 4-0 (SUTURE)
SUT ETHILON 4-0 FS2 18XMFL BLK (SUTURE)
SUT VIC AB 2-0 SH 27 (SUTURE) ×1
SUT VIC AB 2-0 SH 27XBRD (SUTURE) ×1 IMPLANT
SUT VIC AB 3-0 SH 27 (SUTURE)
SUT VIC AB 3-0 SH 27X BRD (SUTURE) IMPLANT
SUT VIC AB 4-0 FS2 27 (SUTURE) IMPLANT
SUTURE ETHLN 4-0 FS2 18XMF BLK (SUTURE) IMPLANT
SWAB CULTURE AMIES ANAERIB BLU (MISCELLANEOUS) IMPLANT
SYR 3ML LL SCALE MARK (SYRINGE) ×2 IMPLANT
SYRINGE 10CC LL (SYRINGE) ×4 IMPLANT

## 2015-03-10 NOTE — Op Note (Signed)
Operative note   Surgeon:Kahlia Lagunes    Assistant:None    Preop diagnosis: diabetic left foot ulcer with surgical wound dehiscence    Postop diagnosis: same    Procedure: debridement with secondary closure of diabetic foot ulcer with surgical wound dehiscence    EBL: minimal    Anesthesia:local and IV sedation    Hemostasis: epinephrine    Specimen: none    Complications: none    Operative indications: 59 year old gentleman who underwent surgical debridement with incision and drainage of an ulcer and abscess to his left foot.  He has had continued opening of the ulceration.  We discussed delayed closure  of the wound and debridement of this.  I discussed performing a localized flap as well.  The risks benefits alternatives and complications associated with the surgery were discussed with the patient full and consent has been given.    Procedure:  Patient was brought into the OR and placed on the operating table in thesupine position. After anesthesia was obtained theleft lower extremity was prepped and draped in usual sterile fashion.    Attention was directed to the plantar aspect of the left foot where the open wound was noted.  The surgically dehisced area was opened with healthy granular tissue.  There was some fibrotic tissue throughout the wound itself.  The wound itself measured 6 cm in length by 2 cm with.  At this time I was able to remove excisionally with a versa jet all of the deeper soft tissue down to subcutaneous tissue.  This was taken down to good healthy bleeding tissue.  This time the wound edges would not reapproximate.  Next a semi elliptical graft from the adjacent skin was then created.  A full-thickness flap was created and the wound was then brought back together much more easily.  Deeper layers were closed with Vicryl suture.  The skin was closed with 2-0 nylon.  A single lobe flap will was use.  Good perfusion of the flap was noted.  A large bulky sterile  dressing was then applied.  There was a smaller ulceration at the  lateralaspect of the great toe proximal to the interphalangeal joint.  This was excisionally debrided with the versa jet down to subcutaneous tissue.  A bulky sterile dressing was applied to all areas.     Patient tolerated the procedure and anesthesia well.  Was transported from the OR to the PACU with all vital signs stable and vascular status intact. To be discharged per routine protocol.  Will follow up in approximately 1 week in the outpatient clinic.

## 2015-03-10 NOTE — Discharge Instructions (Signed)
[facility] °[facility] ° °POST OPERATIVE INSTRUCTIONS FOR DR. TROXLER AND DR. Sumaya Riedesel °[facility] ° ° °1. Take your medication as prescribed.  Pain medication should be taken only as needed. ° °2. Keep the dressing clean, dry and intact. ° °3. Keep your foot elevated above the heart level for the first 48 hours. ° °4. Walking to the bathroom and brief periods of walking are acceptable, unless we have instructed you to be non-weight bearing. ° °5. Always wear your post-op shoe when walking.  Always use your crutches if you are to be non-weight bearing. ° °6. Do not take a shower. Baths are permissible as long as the foot is kept out of the water.  ° °7. Every hour you are awake:  °- Bend your knee 15 times. °- Flex foot 15 times °- Massage calf 15 times ° °8. Call [facility] ([phone]) if any of the following problems occur: °- You develop a temperature or fever. °- The bandage becomes saturated with blood. °- Medication does not stop your pain. °- Injury of the foot occurs. °- Any symptoms of infection including redness, odor, or red streaks running from wound. °-  ° °

## 2015-03-10 NOTE — Transfer of Care (Signed)
Immediate Anesthesia Transfer of Care Note  Patient: [name]  Procedure(s) Performed: Procedure(s): WOUND EXPLORATION/ SECONDARY CLOSURE OF WOUND,COMPLICATED (Left)  Patient Location: PACU  Anesthesia Type:General  Level of Consciousness: awake, alert  and oriented  Airway & Oxygen Therapy: Patient Spontanous Breathing and Patient connected to face mask oxygen  Post-op Assessment: Report given to RN and Post -op Vital signs reviewed and stable  Post vital signs: Reviewed and stable  Last Vitals: 141/87 100% 97hr 18resp 98.3 temp 0844 Filed Vitals:   03/10/15 0616  BP: 143/89  Pulse: 62  Temp: 37 C  Resp: 20    Complications: No apparent anesthesia complications

## 2015-03-10 NOTE — Anesthesia Postprocedure Evaluation (Signed)
  Anesthesia Post-op Note  Patient: [name]  Procedure(s) Performed: Procedure(s): WOUND EXPLORATION/ SECONDARY CLOSURE OF WOUND,COMPLICATED (Left)  Anesthesia type:General  Patient location: PACU  Post pain: Pain level controlled  Post assessment: Post-op Vital signs reviewed, Patient's Cardiovascular Status Stable, Respiratory Function Stable, Patent Airway and No signs of Nausea or vomiting  Post vital signs: Reviewed and stable  Last Vitals:  Filed Vitals:   03/10/15 0616  BP: 143/89  Pulse: 62  Temp: 37 C  Resp: 20    Level of consciousness: awake, alert  and patient cooperative  Complications: No apparent anesthesia complications

## 2015-03-10 NOTE — Anesthesia Preprocedure Evaluation (Signed)
Anesthesia Evaluation  Patient identified by MRN, date of birth, ID band Patient awake    Reviewed: Allergy & Precautions  Airway Mallampati: III  TM Distance: >3 FB Neck ROM: Limited    Dental  (+) Edentulous Upper   Pulmonary shortness of breath, sleep apnea , COPDCurrent Smoker,          Cardiovascular Exercise Tolerance: Poor hypertension, Pt. on medications + CAD and +CHF Rhythm:Regular     Neuro/Psych    GI/Hepatic   Endo/Other  diabetes, Poorly Controlled, Type 2  Renal/GU Renal InsufficiencyRenal disease     Musculoskeletal   Abdominal (+) + obese,   Peds  Hematology   Anesthesia Other Findings   Reproductive/Obstetrics                             Anesthesia Physical Anesthesia Plan  ASA: IV  Anesthesia Plan: General   Post-op Pain Management:    Induction: Intravenous  Airway Management Planned: LMA  Additional Equipment:   Intra-op Plan:   Post-operative Plan: Extubation in OR  Informed Consent: I have reviewed the patients History and Physical, chart, labs and discussed the procedure including the risks, benefits and alternatives for the proposed anesthesia with the patient or authorized representative who has indicated his/her understanding and acceptance.     Plan Discussed with: CRNA  Anesthesia Plan Comments:         Anesthesia Quick Evaluation

## 2015-03-14 ENCOUNTER — Other Ambulatory Visit: Payer: Self-pay | Admitting: *Deleted

## 2015-03-14 MED ORDER — ISOSORBIDE MONONITRATE ER 30 MG PO TB24: 15.0000 mg | ORAL_TABLET | Freq: Every day | ORAL | Status: DC

## 2015-12-17 ENCOUNTER — Inpatient Hospital Stay
Admission: EM | Admit: 2015-12-17 | Discharge: 2015-12-21 | DRG: 603 | Disposition: A | Discharge status: Home Health | Payer: Medicare HMO | Attending: Internal Medicine | Admitting: Internal Medicine

## 2015-12-17 ENCOUNTER — Emergency Department: Payer: Medicare HMO

## 2015-12-17 ENCOUNTER — Encounter: Payer: Self-pay | Admitting: Emergency Medicine

## 2015-12-17 DIAGNOSIS — E11628 Type 2 diabetes mellitus with other skin complications: Secondary | ICD-10-CM | POA: Diagnosis present

## 2015-12-17 DIAGNOSIS — Z8249 Family history of ischemic heart disease and other diseases of the circulatory system: Secondary | ICD-10-CM

## 2015-12-17 DIAGNOSIS — Z7984 Long term (current) use of oral hypoglycemic drugs: Secondary | ICD-10-CM | POA: Diagnosis not present

## 2015-12-17 DIAGNOSIS — L0390 Cellulitis, unspecified: Secondary | ICD-10-CM

## 2015-12-17 DIAGNOSIS — E1122 Type 2 diabetes mellitus with diabetic chronic kidney disease: Secondary | ICD-10-CM | POA: Diagnosis present

## 2015-12-17 DIAGNOSIS — I130 Hypertensive heart and chronic kidney disease with heart failure and stage 1 through stage 4 chronic kidney disease, or unspecified chronic kidney disease: Secondary | ICD-10-CM | POA: Diagnosis present

## 2015-12-17 DIAGNOSIS — E785 Hyperlipidemia, unspecified: Secondary | ICD-10-CM | POA: Diagnosis present

## 2015-12-17 DIAGNOSIS — D751 Secondary polycythemia: Secondary | ICD-10-CM | POA: Diagnosis present

## 2015-12-17 DIAGNOSIS — I5042 Chronic combined systolic (congestive) and diastolic (congestive) heart failure: Secondary | ICD-10-CM | POA: Diagnosis present

## 2015-12-17 DIAGNOSIS — F17210 Nicotine dependence, cigarettes, uncomplicated: Secondary | ICD-10-CM | POA: Diagnosis present

## 2015-12-17 DIAGNOSIS — J449 Chronic obstructive pulmonary disease, unspecified: Secondary | ICD-10-CM | POA: Diagnosis present

## 2015-12-17 DIAGNOSIS — Z6841 Body Mass Index (BMI) 40.0 and over, adult: Secondary | ICD-10-CM

## 2015-12-17 DIAGNOSIS — N183 Chronic kidney disease, stage 3 unspecified: Secondary | ICD-10-CM | POA: Diagnosis present

## 2015-12-17 DIAGNOSIS — Z833 Family history of diabetes mellitus: Secondary | ICD-10-CM

## 2015-12-17 DIAGNOSIS — I420 Dilated cardiomyopathy: Secondary | ICD-10-CM | POA: Diagnosis present

## 2015-12-17 DIAGNOSIS — L03116 Cellulitis of left lower limb: Principal | ICD-10-CM

## 2015-12-17 DIAGNOSIS — E119 Type 2 diabetes mellitus without complications: Secondary | ICD-10-CM

## 2015-12-17 DIAGNOSIS — E1142 Type 2 diabetes mellitus with diabetic polyneuropathy: Secondary | ICD-10-CM | POA: Diagnosis present

## 2015-12-17 DIAGNOSIS — Z79899 Other long term (current) drug therapy: Secondary | ICD-10-CM | POA: Diagnosis not present

## 2015-12-17 DIAGNOSIS — Z7982 Long term (current) use of aspirin: Secondary | ICD-10-CM

## 2015-12-17 DIAGNOSIS — Z7902 Long term (current) use of antithrombotics/antiplatelets: Secondary | ICD-10-CM

## 2015-12-17 DIAGNOSIS — N1831 Chronic kidney disease, stage 3a: Secondary | ICD-10-CM | POA: Diagnosis present

## 2015-12-17 DIAGNOSIS — F329 Major depressive disorder, single episode, unspecified: Secondary | ICD-10-CM | POA: Diagnosis present

## 2015-12-17 HISTORY — DX: Cellulitis of left lower limb: L03.116

## 2015-12-17 HISTORY — DX: Cellulitis, unspecified: L03.90

## 2015-12-17 LAB — SEDIMENTATION RATE: SED RATE: 5 mm/h (ref 0–20)

## 2015-12-17 LAB — GLUCOSE, CAPILLARY: GLUCOSE-CAPILLARY: 151 mg/dL — AB (ref 65–99)

## 2015-12-17 LAB — CBC WITH DIFFERENTIAL/PLATELET
BASOS ABS: 0.1 10*3/uL (ref 0–0.1)
Basophils Relative: 1 %
EOS ABS: 0.3 10*3/uL (ref 0–0.7)
HCT: 41.2 % (ref 40.0–52.0)
Hemoglobin: 13.2 g/dL (ref 13.0–18.0)
Lymphs Abs: 1.7 10*3/uL (ref 1.0–3.6)
MCH: 22.6 pg — ABNORMAL LOW (ref 26.0–34.0)
MCHC: 32 g/dL (ref 32.0–36.0)
MCV: 70.8 fL — ABNORMAL LOW (ref 80.0–100.0)
Monocytes Absolute: 0.8 10*3/uL (ref 0.2–1.0)
Monocytes Relative: 8 %
Neutro Abs: 8 10*3/uL — ABNORMAL HIGH (ref 1.4–6.5)
PLATELETS: 152 10*3/uL (ref 150–440)
RBC: 5.81 MIL/uL (ref 4.40–5.90)
RDW: 20.2 % — ABNORMAL HIGH (ref 11.5–14.5)
WBC: 10.9 10*3/uL — AB (ref 3.8–10.6)

## 2015-12-17 LAB — COMPREHENSIVE METABOLIC PANEL
ALT: 18 U/L (ref 17–63)
ANION GAP: 6 (ref 5–15)
AST: 30 U/L (ref 15–41)
Albumin: 3.1 g/dL — ABNORMAL LOW (ref 3.5–5.0)
Alkaline Phosphatase: 106 U/L (ref 38–126)
BILIRUBIN TOTAL: 0.6 mg/dL (ref 0.3–1.2)
BUN: 12 mg/dL (ref 6–20)
CHLORIDE: 99 mmol/L — AB (ref 101–111)
CO2: 29 mmol/L (ref 22–32)
Calcium: 8.5 mg/dL — ABNORMAL LOW (ref 8.9–10.3)
Creatinine, Ser: 1.39 mg/dL — ABNORMAL HIGH (ref 0.61–1.24)
GFR, EST NON AFRICAN AMERICAN: 54 mL/min — AB (ref >60)
Glucose, Bld: 217 mg/dL — ABNORMAL HIGH (ref 65–99)
Potassium: 4.1 mmol/L (ref 3.5–5.1)
Sodium: 134 mmol/L — ABNORMAL LOW (ref 135–145)
TOTAL PROTEIN: 8 g/dL (ref 6.5–8.1)

## 2015-12-17 LAB — LACTIC ACID, PLASMA
LACTIC ACID, VENOUS: 1.1 mmol/L (ref 0.5–2.0)
Lactic Acid, Venous: 1.3 mmol/L (ref 0.5–2.0)

## 2015-12-17 MED ORDER — VANCOMYCIN HCL 10 G IV SOLR
1500.0000 mg | Freq: Once | INTRAVENOUS | Status: AC
  Administered 2015-12-17: 1500 mg via INTRAVENOUS
  Filled 2015-12-17: qty 1500

## 2015-12-17 MED ORDER — ONDANSETRON HCL 4 MG PO TABS: 4.0000 mg | ORAL_TABLET | Freq: Four times a day (QID) | ORAL | Status: DC | PRN

## 2015-12-17 MED ORDER — VANCOMYCIN HCL IN DEXTROSE 1-5 GM/200ML-% IV SOLN: 1000.0000 mg | Freq: Once | INTRAVENOUS | Status: DC

## 2015-12-17 MED ORDER — DOCUSATE SODIUM 100 MG PO CAPS
100.0000 mg | ORAL_CAPSULE | Freq: Two times a day (BID) | ORAL | Status: DC
  Administered 2015-12-18 – 2015-12-20 (×5): 100 mg via ORAL
  Filled 2015-12-17 (×5): qty 1

## 2015-12-17 MED ORDER — VANCOMYCIN HCL 10 G IV SOLR
2000.0000 mg | INTRAVENOUS | Status: DC
  Filled 2015-12-17: qty 2000

## 2015-12-17 MED ORDER — PRAVASTATIN SODIUM 20 MG PO TABS
40.0000 mg | ORAL_TABLET | Freq: Every day | ORAL | Status: DC
  Administered 2015-12-18 – 2015-12-20 (×3): 40 mg via ORAL
  Filled 2015-12-17 (×3): qty 2

## 2015-12-17 MED ORDER — INSULIN ASPART 100 UNIT/ML ~~LOC~~ SOLN
0.0000 [IU] | Freq: Three times a day (TID) | SUBCUTANEOUS | Status: DC
  Administered 2015-12-18 – 2015-12-20 (×9): 2 [IU] via SUBCUTANEOUS
  Administered 2015-12-21: 3 [IU] via SUBCUTANEOUS
  Filled 2015-12-17: qty 2
  Filled 2015-12-17: qty 3
  Filled 2015-12-17 (×8): qty 2

## 2015-12-17 MED ORDER — ASPIRIN EC 81 MG PO TBEC
81.0000 mg | DELAYED_RELEASE_TABLET | Freq: Every day | ORAL | Status: DC
  Administered 2015-12-17 – 2015-12-20 (×4): 81 mg via ORAL
  Filled 2015-12-17 (×4): qty 1

## 2015-12-17 MED ORDER — PIPERACILLIN-TAZOBACTAM 3.375 G IVPB 30 MIN
3.3750 g | Freq: Once | INTRAVENOUS | Status: AC
  Administered 2015-12-17: 3.375 g via INTRAVENOUS
  Filled 2015-12-17: qty 50

## 2015-12-17 MED ORDER — ACETAMINOPHEN 325 MG PO TABS: 650.0000 mg | ORAL_TABLET | Freq: Four times a day (QID) | ORAL | Status: DC | PRN

## 2015-12-17 MED ORDER — SODIUM CHLORIDE 0.9 % IV SOLN: INTRAVENOUS | Status: DC

## 2015-12-17 MED ORDER — NICOTINE 21 MG/24HR TD PT24
21.0000 mg | MEDICATED_PATCH | Freq: Every day | TRANSDERMAL | Status: DC
  Administered 2015-12-17 – 2015-12-21 (×5): 21 mg via TRANSDERMAL
  Filled 2015-12-17 (×5): qty 1

## 2015-12-17 MED ORDER — VANCOMYCIN HCL 10 G IV SOLR
2000.0000 mg | INTRAVENOUS | Status: DC
  Administered 2015-12-17: 2000 mg via INTRAVENOUS
  Filled 2015-12-17 (×2): qty 2000

## 2015-12-17 MED ORDER — LINAGLIPTIN 5 MG PO TABS
5.0000 mg | ORAL_TABLET | ORAL | Status: DC
  Administered 2015-12-18 – 2015-12-21 (×4): 5 mg via ORAL
  Filled 2015-12-17 (×4): qty 1

## 2015-12-17 MED ORDER — ALBUTEROL SULFATE (2.5 MG/3ML) 0.083% IN NEBU: 2.5000 mg | INHALATION_SOLUTION | RESPIRATORY_TRACT | Status: DC | PRN

## 2015-12-17 MED ORDER — HYDROMORPHONE HCL 1 MG/ML IJ SOLN
1.0000 mg | Freq: Once | INTRAMUSCULAR | Status: AC
  Administered 2015-12-17: 1 mg via INTRAVENOUS
  Filled 2015-12-17: qty 1

## 2015-12-17 MED ORDER — METOPROLOL TARTRATE 25 MG PO TABS
25.0000 mg | ORAL_TABLET | Freq: Two times a day (BID) | ORAL | Status: DC
  Administered 2015-12-17 – 2015-12-21 (×8): 25 mg via ORAL
  Filled 2015-12-17 (×8): qty 1

## 2015-12-17 MED ORDER — LISINOPRIL 10 MG PO TABS
10.0000 mg | ORAL_TABLET | ORAL | Status: DC
  Administered 2015-12-18: 10 mg via ORAL
  Filled 2015-12-17: qty 1

## 2015-12-17 MED ORDER — PREGABALIN 50 MG PO CAPS
100.0000 mg | ORAL_CAPSULE | Freq: Three times a day (TID) | ORAL | Status: DC
  Administered 2015-12-17 – 2015-12-21 (×11): 100 mg via ORAL
  Filled 2015-12-17 (×11): qty 2

## 2015-12-17 MED ORDER — HEPARIN SODIUM (PORCINE) 5000 UNIT/ML IJ SOLN
5000.0000 [IU] | Freq: Three times a day (TID) | INTRAMUSCULAR | Status: DC
  Administered 2015-12-17 – 2015-12-19 (×6): 5000 [IU] via SUBCUTANEOUS
  Filled 2015-12-17 (×6): qty 1

## 2015-12-17 MED ORDER — HYDROMORPHONE HCL 1 MG/ML IJ SOLN
1.0000 mg | INTRAMUSCULAR | Status: DC | PRN
Start: 2015-12-17 — End: 2015-12-21
  Administered 2015-12-17 – 2015-12-19 (×5): 1 mg via INTRAVENOUS
  Filled 2015-12-17 (×5): qty 1

## 2015-12-17 MED ORDER — ALBUTEROL SULFATE HFA 108 (90 BASE) MCG/ACT IN AERS: 2.0000 | INHALATION_SPRAY | RESPIRATORY_TRACT | Status: DC | PRN

## 2015-12-17 MED ORDER — GLIPIZIDE ER 10 MG PO TB24
10.0000 mg | ORAL_TABLET | Freq: Every day | ORAL | Status: DC
  Administered 2015-12-18 – 2015-12-21 (×4): 10 mg via ORAL
  Filled 2015-12-17 (×5): qty 1

## 2015-12-17 MED ORDER — LORAZEPAM 2 MG/ML IJ SOLN: 0.5000 mg | INTRAMUSCULAR | Status: DC | PRN

## 2015-12-17 MED ORDER — PIPERACILLIN-TAZOBACTAM 3.375 G IVPB
3.3750 g | Freq: Three times a day (TID) | INTRAVENOUS | Status: DC
  Administered 2015-12-17 – 2015-12-20 (×8): 3.375 g via INTRAVENOUS
  Filled 2015-12-17 (×11): qty 50

## 2015-12-17 MED ORDER — PANTOPRAZOLE SODIUM 40 MG PO TBEC
40.0000 mg | DELAYED_RELEASE_TABLET | Freq: Every day | ORAL | Status: DC
  Administered 2015-12-17 – 2015-12-21 (×5): 40 mg via ORAL
  Filled 2015-12-17 (×5): qty 1

## 2015-12-17 MED ORDER — CLOPIDOGREL BISULFATE 75 MG PO TABS
75.0000 mg | ORAL_TABLET | Freq: Every day | ORAL | Status: DC
  Administered 2015-12-18 – 2015-12-21 (×4): 75 mg via ORAL
  Filled 2015-12-17 (×4): qty 1

## 2015-12-17 MED ORDER — NITROGLYCERIN 0.4 MG SL SUBL: 0.4000 mg | SUBLINGUAL_TABLET | SUBLINGUAL | Status: DC | PRN

## 2015-12-17 MED ORDER — LORATADINE 10 MG PO TABS: 10.0000 mg | ORAL_TABLET | Freq: Every day | ORAL | Status: DC | PRN

## 2015-12-17 MED ORDER — TRAZODONE HCL 50 MG PO TABS
150.0000 mg | ORAL_TABLET | Freq: Every day | ORAL | Status: DC
  Administered 2015-12-17 – 2015-12-20 (×4): 150 mg via ORAL
  Filled 2015-12-17 (×4): qty 1

## 2015-12-17 MED ORDER — SERTRALINE HCL 100 MG PO TABS
100.0000 mg | ORAL_TABLET | Freq: Every day | ORAL | Status: DC
  Administered 2015-12-17 – 2015-12-20 (×4): 100 mg via ORAL
  Filled 2015-12-17 (×4): qty 1

## 2015-12-17 MED ORDER — VANCOMYCIN HCL IN DEXTROSE 1-5 GM/200ML-% IV SOLN
1000.0000 mg | Freq: Once | INTRAVENOUS | Status: DC
  Filled 2015-12-17: qty 200

## 2015-12-17 MED ORDER — ISOSORBIDE MONONITRATE ER 30 MG PO TB24
15.0000 mg | ORAL_TABLET | Freq: Every day | ORAL | Status: DC
  Administered 2015-12-18 – 2015-12-21 (×4): 15 mg via ORAL
  Filled 2015-12-17 (×5): qty 1

## 2015-12-17 MED ORDER — ACETAMINOPHEN 650 MG RE SUPP: 650.0000 mg | Freq: Four times a day (QID) | RECTAL | Status: DC | PRN

## 2015-12-17 MED ORDER — BISACODYL 10 MG RE SUPP: 10.0000 mg | Freq: Every day | RECTAL | Status: DC | PRN

## 2015-12-17 MED ORDER — ONDANSETRON HCL 4 MG/2ML IJ SOLN: 4.0000 mg | Freq: Four times a day (QID) | INTRAMUSCULAR | Status: DC | PRN

## 2015-12-17 MED ORDER — ONDANSETRON HCL 4 MG/2ML IJ SOLN
4.0000 mg | Freq: Once | INTRAMUSCULAR | Status: AC
  Administered 2015-12-17: 4 mg via INTRAVENOUS
  Filled 2015-12-17: qty 2

## 2015-12-17 MED ORDER — HYDROCODONE-ACETAMINOPHEN 5-325 MG PO TABS
1.0000 | ORAL_TABLET | Freq: Four times a day (QID) | ORAL | Status: DC | PRN
  Administered 2015-12-17 – 2015-12-18 (×2): 1 via ORAL
  Filled 2015-12-17 (×2): qty 1

## 2015-12-17 NOTE — ED Notes (Signed)
Pt states wound on his foot is from stepping on a staple that went through his shoe and he didn't realize it. Pt is a diabetic with neuropathy at this time.

## 2015-12-17 NOTE — Consult Note (Signed)
Pharmacy Antibiotic Note  [name] is a 60 y.o. male admitted on 12/17/2015 with cellulitis.  Pharmacy has been consulted for vancomycin dosing. Patient also has a chronic left foot wound  Plan: Patient received 1g of vancomycin in the ED. Weight and height obtained from care everywhere 151kg, 69foot tall AWB CrCl=87.64 , normalized crcl =58.72ml/min. Will start vanc 2g 6 hours after initial dose for stacked dosing. Vancomycin 2000 IV every 24 hours.  Goal trough 10-15 mcg/mL. Will drawn tough at steady state 5/17 @ 2230 Pt is at risk of accumulation due to size.    Temp (24hrs), Avg:97.8 F (36.6 C), Min:97.8 F (36.6 C), Max:97.8 F (36.6 C)   Recent Labs Lab 12/17/15 1526 12/17/15 1556  WBC  --  10.9*  CREATININE  --  1.39*  LATICACIDVEN 1.3  --     CrCl cannot be calculated (Unknown ideal weight.).    No Known Allergies  Antimicrobials this admission: vancomycin 5/14 >>  zosyn 5/14 >>   Dose adjustments this admission:   Microbiology results: 5/14 BCx: pending   Thank you for allowing pharmacy to be a part of this patient's care.  Olene Floss, Pharm.D Clinical Pharmacist   12/17/2015 7:01 PM

## 2015-12-17 NOTE — ED Provider Notes (Signed)
Time Seen: Approximately 1515  I have reviewed the triage notes  Chief Complaint: Knee Pain   History of Present Illness: [name] is a 60 y.o. male who presents with acute onset of left lower extremity discomfort. Patient has a history of previous surgery on his left lower extremity he states due to a fracture back in 2008. He is also followed by a podiatrist for a chronic wound on the bottom of his left foot that was secondary to a previous trauma from a staple. Patient has history of insulin-dependent diabetes and is noticed increased pain and swelling and redness in his left lower extremity mainly from the left leg distal. He states it's of painful for him to try to ambulate. He is not aware of any fever at home but states his blood sugars have been "" running high  "". He states the pain and swelling started acutely on Saturday and got progressively worse.  Past Medical History  Diagnosis Date  . COPD (chronic obstructive pulmonary disease) ([name])   . Hyperlipidemia   . Hypertension   . Type II diabetes mellitus ([name])   . Tobacco abuse     a. 46 yrs, up to 2 ppd, changed to e-cigarette 05/2013.  . Cardiomyopathy ([name])     a. 10/2013 Echo: EF 35-40%, mild MR/TR;  b. 11/2013 Lexi MV: EF 46%, fixed apical defect with small distal antsept ischemia->overall low-risk->med Rx.  . Secondary erythrocytosis     a. followed by heme-onc with periodic phlebotomy.  . CKD (chronic kidney disease), stage III     a. Creat 1.42 (10/2013).  . Ischemic toe     a. 10/2013 L fifth toe - felt to be either 2/2 embolic plaque vs erythrocytosis - seen by vascular surgery ([facility]) ->conservative rx.  Marland Kitchen LBBB (left bundle branch block)   . Collagen vascular disease ([name])   . CHF (congestive heart failure) ([name])   . Peripheral neuropathy [facility])     Patient Active Problem List   Diagnosis Date Noted  . HTN (hypertension) 01/17/2015  . Peripheral neuropathy ([name]) 01/17/2015  . CKD (chronic kidney  disease), stage III 01/17/2015  . Chronic combined systolic and diastolic CHF (congestive heart failure) ([name]) 01/17/2015  . Cellulitis and abscess of foot 01/17/2015  . CAD (coronary artery disease) 01/07/2014  . SOB (shortness of breath) 11/16/2013  . Chest pain 11/16/2013  . Morbid obesity ([name]) 11/16/2013  . Hyperlipidemia 11/16/2013  . Diabetes mellitus, type 2 ([name]) 11/16/2013  . Smoker 11/16/2013  . Left bundle branch block 11/16/2013  . Ischemic toe 11/16/2013  . Congestive dilated cardiomyopathy ([name]) 11/16/2013    Past Surgical History  Procedure Laterality Date  . Orif tibia fracture Left 2008  . I&d extremity Left 01/18/2015    Procedure: IRRIGATION AND DEBRIDEMENT EXTREMITY;  Surgeon: Gwyneth Revels, DPM;  Location: [facility] ORS;  Service: Podiatry;  Laterality: Left;  . Wound exploration Left 03/10/2015    Procedure: WOUND EXPLORATION/ SECONDARY CLOSURE OF WOUND,COMPLICATED;  Surgeon: Gwyneth Revels, DPM;  Location: [facility] ORS;  Service: Podiatry;  Laterality: Left;    Past Surgical History  Procedure Laterality Date  . Orif tibia fracture Left 2008  . I&d extremity Left 01/18/2015    Procedure: IRRIGATION AND DEBRIDEMENT EXTREMITY;  Surgeon: Gwyneth Revels, DPM;  Location: [facility] ORS;  Service: Podiatry;  Laterality: Left;  . Wound exploration Left 03/10/2015    Procedure: WOUND EXPLORATION/ SECONDARY CLOSURE OF WOUND,COMPLICATED;  Surgeon: Gwyneth Revels, DPM;  Location: [facility] ORS;  Service:  Podiatry;  Laterality: Left;    Current Outpatient Rx  Name  Route  Sig  Dispense  Refill  . albuterol (PROVENTIL HFA;VENTOLIN HFA) 108 (90 BASE) MCG/ACT inhaler   Inhalation   Inhale 2 puffs into the lungs every 4 (four) hours as needed for wheezing or shortness of breath.         Marland Kitchen aspirin 81 MG tablet   Oral   Take 81 mg by mouth at bedtime.          . B Complex-C (B-COMPLEX WITH VITAMIN C) tablet   Oral   Take 1 tablet by mouth daily.         . Cholecalciferol (VITAMIN D3)  50000 UNITS CAPS   Oral   Take 1 capsule by mouth every 30 (thirty) days.         . clopidogrel (PLAVIX) 75 MG tablet   Oral   Take 75 mg by mouth daily with breakfast.         . diazepam (VALIUM) 2 MG tablet   Oral   Take 1 tablet (2 mg total) by mouth every 8 (eight) hours as needed for muscle spasms. Patient not taking: Reported on 03/10/2015   15 tablet   0   . glipiZIDE (GLUCOTROL XL) 5 MG 24 hr tablet   Oral   Take 5 mg by mouth daily with breakfast.         . HYDROcodone-acetaminophen (NORCO) 5-325 MG per tablet   Oral   Take 1 tablet by mouth every 6 (six) hours as needed for moderate pain.   30 tablet   0   . isosorbide mononitrate (IMDUR) 30 MG 24 hr tablet   Oral   Take 0.5 tablets (15 mg total) by mouth daily.   90 tablet   3   . linagliptin (TRADJENTA) 5 MG TABS tablet   Oral   Take 5 mg by mouth every morning.          Marland Kitchen lisinopril (PRINIVIL,ZESTRIL) 20 MG tablet   Oral   Take 10 mg by mouth every morning.          . loratadine (CLARITIN) 10 MG tablet   Oral   Take 10 mg by mouth daily as needed for allergies (seasonal allergies in the spring).          Marland Kitchen lovastatin (MEVACOR) 40 MG tablet   Oral   Take 40 mg by mouth at bedtime.         . metoprolol tartrate (LOPRESSOR) 25 MG tablet   Oral   Take 1 tablet (25 mg total) by mouth 2 (two) times daily.   180 tablet   3   . nitroGLYCERIN (NITROSTAT) 0.4 MG SL tablet   Sublingual   Place 1 tablet (0.4 mg total) under the tongue every 5 (five) minutes as needed for chest pain.   25 tablet   3   . oxyCODONE (OXY IR/ROXICODONE) 5 MG immediate release tablet   Oral   Take 5 mg by mouth every 4 (four) hours as needed for severe pain.         . pregabalin (LYRICA) 100 MG capsule   Oral   Take 100 mg by mouth 3 (three) times daily.         . sertraline (ZOLOFT) 50 MG tablet   Oral   Take 50 mg by mouth at bedtime.          . traZODone (DESYREL) 150 MG tablet  TAKE 1  TABLET BY MOUTH AT BEDTIME.   30 tablet   0     Per Dr. Mariah Milling the patient can only have 30 tablet ...     Allergies:  Review of patient's allergies indicates no known allergies.  Family History: Family History  Problem Relation Age of Onset  . Hypertension Mother     alive @ 23  . Hyperlipidemia Mother   . Hypertension Brother   . Hyperlipidemia Brother   . Hypertension Maternal Aunt   . Hypertension Brother   . Hyperlipidemia Brother   . Hypertension Brother   . Hyperlipidemia Brother   . Diabetes Brother     Social History: Social History  Substance Use Topics  . Smoking status: Current Every Day Smoker -- 0.25 packs/day for 46 years    Types: Cigarettes, E-cigarettes  . Smokeless tobacco: Current User     Comment: Currently smoking E cigarettes.  . Alcohol Use: No     Comment: alcohol abuse in the past.      Review of Systems:   10 point review of systems was performed and was otherwise negative:  Constitutional: No fever Eyes: No visual disturbances ENT: No sore throat, ear pain Cardiac: No chest pain Respiratory: No shortness of breath, wheezing, or stridor Abdomen: No abdominal pain, no vomiting, No diarrhea Endocrine: No weight loss, No night sweats Extremities: No peripheral edema, cyanosis Skin: No rashes, easy bruising Neurologic: No focal weakness, trouble with speech or swollowing Urologic: No dysuria, Hematuria, or urinary frequency   Physical Exam:  ED Triage Vitals  Enc Vitals Group     BP 12/17/15 1444 117/72 mmHg     Pulse Rate 12/17/15 1444 80     Resp 12/17/15 1444 16     Temp 12/17/15 1444 97.8 F (36.6 C)     Temp Source 12/17/15 1444 Oral     SpO2 12/17/15 1444 96 %     Weight --      Height --      Head Cir --      Peak Flow --      Pain Score 12/17/15 1441 10     Pain Loc --      Pain Edu? --      Excl. in GC? --     General: Awake , Alert , and Oriented times 3; GCS 15 Head: Normal cephalic , atraumatic Eyes:  Pupils equal , round, reactive to light Nose/Throat: No nasal drainage, patent upper airway without erythema or exudate.  Neck: Supple, Full range of motion, No anterior adenopathy or palpable thyroid masses Lungs: Clear to ascultation without wheezes , rhonchi, or rales Heart: Regular rate, regular rhythm without murmurs , gallops , or rubs Abdomen:Obese Soft, non tender without rebound, guarding , or rigidity; bowel sounds positive and symmetric in all 4 quadrants. No organomegaly .        Extremities: Left lower extremity shows unilateral circumferential swelling with erythema especially over the lateral surface of his left leg with old well-healed well aligned postoperative scars. Patient also has a lesion at the base of his foot somewhat dark in appearance. There is no exudate or drainage. Neurologic: normal ambulation, Motor symmetric without deficits, sensory intact Skin: warm, dry, no rashes   Labs:   All laboratory work was reviewed including any pertinent negatives or positives listed below:  Labs Reviewed  CULTURE, BLOOD (ROUTINE X 2)  CULTURE, BLOOD (ROUTINE X 2)  COMPREHENSIVE METABOLIC PANEL  CBC WITH DIFFERENTIAL/PLATELET  SEDIMENTATION  RATE  LACTIC ACID, PLASMA  LACTIC ACID, PLASMA  Laboratory work was reviewed and showed no clinically significant abnormalities.    Radiology: *    EXAM: Left LOWER EXTREMITY VENOUS DOPPLER ULTRASOUND  TECHNIQUE: Gray-scale sonography with graded compression, as well as color Doppler and duplex ultrasound were performed to evaluate the lower extremity deep venous systems from the level of the common femoral vein and including the common femoral, femoral, profunda femoral, popliteal and calf veins including the posterior tibial, peroneal and gastrocnemius veins when visible. The superficial great saphenous vein was also interrogated. Spectral Doppler was utilized to evaluate flow at rest and with distal augmentation maneuvers  in the common femoral, femoral and popliteal veins.  COMPARISON: None.  FINDINGS: Contralateral Common Femoral Vein: Respiratory phasicity is normal and symmetric with the symptomatic side. No evidence of thrombus. Normal compressibility.  Common Femoral Vein: No evidence of thrombus. Normal compressibility, respiratory phasicity and response to augmentation.  Saphenofemoral Junction: No evidence of thrombus. Normal compressibility and flow on color Doppler imaging.  Profunda Femoral Vein: No evidence of thrombus. Normal compressibility and flow on color Doppler imaging.  Femoral Vein: No evidence of thrombus. Normal compressibility, respiratory phasicity and response to augmentation.  Popliteal Vein: No evidence of thrombus. Normal compressibility, respiratory phasicity and response to augmentation.  Calf Veins: No evidence of thrombus. Normal compressibility and flow on color Doppler imaging.  Superficial Great Saphenous Vein: No evidence of thrombus. Normal compressibility and flow on color Doppler imaging.  Venous Reflux: None.  Other Findings: None.  IMPRESSION: No evidence of deep venous thrombosis left lower extremity.   Electronically Signed By: Natasha Mead M.D. On: 12/17/2015 16:58  I personally reviewed the radiologic studies   ED Course: * Patient's stay here in emergency department was uneventful. Since patient's diabetic and has some underlying hardware where the area of the cellulitis is located I felt required further inpatient IV antibiotics. He was started on Zosyn here in emergency department has blood cultures 2 pending he was also given a dose of vancomycin per weight and renal function. He does not appear to be septic at this time and his Doppler study did not show any underlying deep venous thrombosis which would be unlikely in the face of cellulitis  Assessment:  Left lower extremity cellulitis    Plan: * Inpatient  management           Jennye Moccasin, MD 12/17/15 1806

## 2015-12-17 NOTE — ED Notes (Signed)
Pt presents to ED via POV. Per patient's wife he is being followed at [facility] for a wound to the bottom of his left foot. Pt states was seen by Dr. Ether Griffins on Thursday. On Friday patient began having redness extending up his L left. Pt states yesterday his L knee began to hurt, no injury noted at this time. Pt's L foot noted to be discolored and swollen with a wound noted to the ball of his L foot.

## 2015-12-17 NOTE — H&P (Signed)
History and Physical    [name] QHU:[id number] DOB: 1955/11/29 DOA: 12/17/2015  Referring physician: Dr. Huel Cote PCP: No PCP Per Patient  Specialists: Dr. Ether Griffins  Chief Complaint: left leg pain with redness and swelling  HPI: [name] is a 60 y.o. male has a past medical history significant for DM with neuropathy, morbid obesity, HTN, and CAD. Now with 1 week hx of progressive LLE swelling, pain, and redness. Has a chronic wound to left foot followed by Podiatry. Has known diabetes with sever neuropathy. Presents to ER with no fever but with leukocytosis and severe warmth, redness, swelling of LLE. Pt unable to walk due to pain. He is now admitted. No N/V/D. Denies Cp or SOB.  Review of Systems: The patient denies anorexia, fever, weight loss,, vision loss, decreased hearing, hoarseness, chest pain, syncope, dyspnea on exertion,balance deficits, hemoptysis, abdominal pain, melena, hematochezia, severe indigestion/heartburn, hematuria, incontinence, genital sores, muscle weakness, suspicious skin lesions, transient blindness, depression, unusual weight change, abnormal bleeding, enlarged lymph nodes, angioedema, and breast masses.   Past Medical History  Diagnosis Date  . COPD (chronic obstructive pulmonary disease) ([name])   . Hyperlipidemia   . Hypertension   . Type II diabetes mellitus ([name])   . Tobacco abuse     a. 46 yrs, up to 2 ppd, changed to e-cigarette 05/2013.  . Cardiomyopathy ([name])     a. 10/2013 Echo: EF 35-40%, mild MR/TR;  b. 11/2013 Lexi MV: EF 46%, fixed apical defect with small distal antsept ischemia->overall low-risk->med Rx.  . Secondary erythrocytosis     a. followed by heme-onc with periodic phlebotomy.  . CKD (chronic kidney disease), stage III     a. Creat 1.42 (10/2013).  . Ischemic toe     a. 10/2013 L fifth toe - felt to be either 2/2 embolic plaque vs erythrocytosis - seen by vascular surgery ([facility]) ->conservative rx.  Marland Kitchen LBBB (left bundle branch  block)   . Collagen vascular disease ([name])   . CHF (congestive heart failure) ([name])   . Peripheral neuropathy [facility])    Past Surgical History  Procedure Laterality Date  . Orif tibia fracture Left 2008  . I&d extremity Left 01/18/2015    Procedure: IRRIGATION AND DEBRIDEMENT EXTREMITY;  Surgeon: Gwyneth Revels, DPM;  Location: [facility] ORS;  Service: Podiatry;  Laterality: Left;  . Wound exploration Left 03/10/2015    Procedure: WOUND EXPLORATION/ SECONDARY CLOSURE OF WOUND,COMPLICATED;  Surgeon: Gwyneth Revels, DPM;  Location: [facility] ORS;  Service: Podiatry;  Laterality: Left;   Social History:  reports that he has been smoking Cigarettes and E-cigarettes.  He has a 11.5 pack-year smoking history. He uses smokeless tobacco. He reports that he does not drink alcohol or use illicit drugs.  No Known Allergies  Family History  Problem Relation Age of Onset  . Hypertension Mother     alive @ 34  . Hyperlipidemia Mother   . Hypertension Brother   . Hyperlipidemia Brother   . Hypertension Maternal Aunt   . Hypertension Brother   . Hyperlipidemia Brother   . Hypertension Brother   . Hyperlipidemia Brother   . Diabetes Brother     Prior to Admission medications   Medication Sig Start Date End Date Taking? Authorizing Provider  albuterol (PROVENTIL HFA;VENTOLIN HFA) 108 (90 BASE) MCG/ACT inhaler Inhale 2 puffs into the lungs every 4 (four) hours as needed for wheezing or shortness of breath.    Historical Provider, MD  aspirin 81 MG tablet Take 81 mg by  mouth at bedtime.     Historical Provider, MD  clopidogrel (PLAVIX) 75 MG tablet Take 75 mg by mouth daily with breakfast.    Historical Provider, MD  diazepam (VALIUM) 2 MG tablet Take 1 tablet (2 mg total) by mouth every 8 (eight) hours as needed for muscle spasms. Patient not taking: Reported on 03/10/2015 01/09/15 01/09/16  Darien Ramus, MD  glipiZIDE (GLUCOTROL XL) 5 MG 24 hr tablet Take 5 mg by mouth daily with breakfast.    Historical Provider,  MD  HYDROcodone-acetaminophen (NORCO) 5-325 MG per tablet Take 1 tablet by mouth every 6 (six) hours as needed for moderate pain. 03/10/15   Gwyneth Revels, DPM  isosorbide mononitrate (IMDUR) 30 MG 24 hr tablet Take 0.5 tablets (15 mg total) by mouth daily. 03/14/15   Antonieta Iba, MD  linagliptin (TRADJENTA) 5 MG TABS tablet Take 5 mg by mouth every morning.     Historical Provider, MD  lisinopril (PRINIVIL,ZESTRIL) 20 MG tablet Take 10 mg by mouth every morning.     Historical Provider, MD  loratadine (CLARITIN) 10 MG tablet Take 10 mg by mouth daily as needed for allergies (seasonal allergies in the spring).     Historical Provider, MD  lovastatin (MEVACOR) 40 MG tablet Take 40 mg by mouth at bedtime.    Historical Provider, MD  metoprolol tartrate (LOPRESSOR) 25 MG tablet Take 1 tablet (25 mg total) by mouth 2 (two) times daily. 09/01/14   Antonieta Iba, MD  nitroGLYCERIN (NITROSTAT) 0.4 MG SL tablet Place 1 tablet (0.4 mg total) under the tongue every 5 (five) minutes as needed for chest pain. 12/28/13   Ok Anis, NP  pregabalin (LYRICA) 100 MG capsule Take 100 mg by mouth 3 (three) times daily.    Historical Provider, MD  sertraline (ZOLOFT) 50 MG tablet Take 50 mg by mouth at bedtime.  12/30/14   Historical Provider, MD  traZODone (DESYREL) 150 MG tablet TAKE 1 TABLET BY MOUTH AT BEDTIME. 09/13/14   Antonieta Iba, MD   Physical Exam: Filed Vitals:   12/17/15 1600 12/17/15 1759 12/17/15 1800 12/17/15 1801  BP: 100/73  103/73   Pulse: 77 69 69 70  Temp:      TempSrc:      Resp: SpO2: 93% 97% 96% 94%     General:  Morbidly obese in moderate distress, [place]/AT  Eyes: PERRL, EOMI, no scleral icterus, conjunctiva clear  ENT: moist oropharynx without lesions or exudate, TM's benign, dentition fair  Neck: supple, no lymphadenopathy. No thyromegaly or bruits  Cardiovascular: regular rate without MRG; 2+ peripheral pulses, no JVD, 2+ peripheral edema on left and 1+  edema on right  Respiratory: CTA biL, good air movement without wheezing, rhonchi or crackled, respiratory effort normal  Abdomen: soft, non tender to palpation, positive bowel sounds, no guarding, no rebound  Skin: no rashes. LLE diffusely red and warmth from ankle to 2cm below knee. Chronic wound noted to sole of left foot under the left great toe.  Musculoskeletal: normal bulk and tone, no joint swelling  Psychiatric: normal mood and affect, A&OX3  Neurologic: CN 2-12 grossly intact, Motor strength 5/5 in all 4 groups, stocking-glove neuropathy noted, DTR's symmetric  Labs on Admission:  Basic Metabolic Panel:  Recent Labs Lab 12/17/15 1556  NA 134*  K 4.1  CL 99*  CO2 29  GLUCOSE 217*  BUN 12  CREATININE 1.39*  CALCIUM 8.5*   Liver Function Tests:  Recent Labs Lab 12/17/15 1556  AST 30  ALT 18  ALKPHOS 106  BILITOT 0.6  PROT 8.0  ALBUMIN 3.1*   No results for input(s): LIPASE, AMYLASE in the last 168 hours. No results for input(s): AMMONIA in the last 168 hours. CBC:  Recent Labs Lab 12/17/15 1556  WBC 10.9*  NEUTROABS 8.0*  HGB 13.2  HCT 41.2  MCV 70.8*  PLT 152   Cardiac Enzymes: No results for input(s): CKTOTAL, CKMB, CKMBINDEX, TROPONINI in the last 168 hours.  BNP (last 3 results) No results for input(s): BNP in the last 8760 hours.  ProBNP (last 3 results) No results for input(s): PROBNP in the last 8760 hours.  CBG: No results for input(s): GLUCAP in the last 168 hours.  Radiological Exams on Admission: US Venous Img Lower Unilateral Left  12/17/2015  CLINICAL DATA:  Left lower extremity swelling and redness EXAM: Left LOWER EXTREMITY VENOUS DOPPLER ULTRASOUND TECHNIQUE: Gray-scale sonography with graded compression, as well as color Doppler and duplex ultrasound were performed to evaluate the lower extremity deep venous systems from the level of the common femoral vein and including the common femoral, femoral, profunda femoral,  popliteal and calf veins including the posterior tibial, peroneal and gastrocnemius veins when visible. The superficial great saphenous vein was also interrogated. Spectral Doppler was utilized to evaluate flow at rest and with distal augmentation maneuvers in the common femoral, femoral and popliteal veins. COMPARISON:  None. FINDINGS: Contralateral Common Femoral Vein: Respiratory phasicity is normal and symmetric with the symptomatic side. No evidence of thrombus. Normal compressibility. Common Femoral Vein: No evidence of thrombus. Normal compressibility, respiratory phasicity and response to augmentation. Saphenofemoral Junction: No evidence of thrombus. Normal compressibility and flow on color Doppler imaging. Profunda Femoral Vein: No evidence of thrombus. Normal compressibility and flow on color Doppler imaging. Femoral Vein: No evidence of thrombus. Normal compressibility, respiratory phasicity and response to augmentation. Popliteal Vein: No evidence of thrombus. Normal compressibility, respiratory phasicity and response to augmentation. Calf Veins: No evidence of thrombus. Normal compressibility and flow on color Doppler imaging. Superficial Great Saphenous Vein: No evidence of thrombus. Normal compressibility and flow on color Doppler imaging. Venous Reflux:  None. Other Findings:  None. IMPRESSION: No evidence of deep venous thrombosis left lower extremity. Electronically Signed   By: Natasha Mead M.D.   On: 12/17/2015 16:58    EKG: Independently reviewed.  Assessment/Plan Principal Problem:   Cellulitis of left leg Active Problems:   Morbid obesity ([name])   Diabetes mellitus, type 2 ([name])   CKD (chronic kidney disease), stage III   Cultures sent. Will admit to floor with IV fluids and IV ABX. Follow sugars. Consult Podiatry. Nicotine patch ordered. Repeat labs in AM.  Diet: carb modified Fluids: NS@125  DVT Prophylaxis: SQ Heparin  Code Status: FULL  Family Communication: yes   Disposition Plan: home  Time spent: 55 min

## 2015-12-18 LAB — COMPREHENSIVE METABOLIC PANEL
ALK PHOS: 100 U/L (ref 38–126)
ALT: 16 U/L — AB (ref 17–63)
AST: 25 U/L (ref 15–41)
Albumin: 3 g/dL — ABNORMAL LOW (ref 3.5–5.0)
Anion gap: 5 (ref 5–15)
BILIRUBIN TOTAL: 1 mg/dL (ref 0.3–1.2)
BUN: 13 mg/dL (ref 6–20)
CALCIUM: 8 mg/dL — AB (ref 8.9–10.3)
CHLORIDE: 100 mmol/L — AB (ref 101–111)
CO2: 30 mmol/L (ref 22–32)
CREATININE: 1.58 mg/dL — AB (ref 0.61–1.24)
GFR calc Af Amer: 54 mL/min — ABNORMAL LOW (ref >60)
GFR, EST NON AFRICAN AMERICAN: 47 mL/min — AB (ref >60)
Glucose, Bld: 188 mg/dL — ABNORMAL HIGH (ref 65–99)
Potassium: 3.7 mmol/L (ref 3.5–5.1)
Sodium: 135 mmol/L (ref 135–145)
Total Protein: 7.7 g/dL (ref 6.5–8.1)

## 2015-12-18 LAB — CBC
HCT: 40.3 % (ref 40.0–52.0)
Hemoglobin: 12.7 g/dL — ABNORMAL LOW (ref 13.0–18.0)
MCH: 22.5 pg — AB (ref 26.0–34.0)
MCHC: 31.4 g/dL — ABNORMAL LOW (ref 32.0–36.0)
MCV: 71.5 fL — AB (ref 80.0–100.0)
PLATELETS: 154 10*3/uL (ref 150–440)
RBC: 5.64 MIL/uL (ref 4.40–5.90)
RDW: 20.6 % — AB (ref 11.5–14.5)
WBC: 10 10*3/uL (ref 3.8–10.6)

## 2015-12-18 LAB — GLUCOSE, CAPILLARY
GLUCOSE-CAPILLARY: 158 mg/dL — AB (ref 65–99)
GLUCOSE-CAPILLARY: 192 mg/dL — AB (ref 65–99)
GLUCOSE-CAPILLARY: 171 mg/dL — AB (ref 65–99)
GLUCOSE-CAPILLARY: 226 mg/dL — AB (ref 65–99)

## 2015-12-18 MED ORDER — VANCOMYCIN HCL IN DEXTROSE 1-5 GM/200ML-% IV SOLN
1000.0000 mg | Freq: Two times a day (BID) | INTRAVENOUS | Status: DC
  Administered 2015-12-18 – 2015-12-20 (×4): 1000 mg via INTRAVENOUS
  Filled 2015-12-18 (×5): qty 200

## 2015-12-18 MED ORDER — HYDROCODONE-ACETAMINOPHEN 10-325 MG PO TABS
1.0000 | ORAL_TABLET | ORAL | Status: DC | PRN
  Administered 2015-12-18 – 2015-12-21 (×13): 1 via ORAL
  Filled 2015-12-18 (×14): qty 1

## 2015-12-18 NOTE — Care Management Note (Signed)
Case Management Note  Patient Details  Name: [name] MRN: [id number] Date of Birth: 07/31/1956  Subjective/Objective:                  Met with patient to discuss discharge planning. He goes to [facility] for PCP. He lives with his girlfriend. He is followed also by Dr. Fowler (about one year per patient) for leg wounds. He has a walker and wheelchair available at home. He agrees to home health and would like to use [facility] if it is needed.   Action/Plan: Referral to jason with [facility]. RNCM will continue to follow.   Expected Discharge Date:                  Expected Discharge Plan:     In-House Referral:     Discharge planning Services  CM Consult  Post Acute Care Choice:  Home Health Choice offered to:  Patient  DME Arranged:    DME Agency:     HH Arranged:    HH Agency:     Status of Service:  In process, will continue to follow  Medicare Important Message Given:    Date Medicare IM Given:    Medicare IM give by:    Date Additional Medicare IM Given:    Additional Medicare Important Message give by:     If discussed at Long Length of Stay Meetings, dates discussed:    Additional Comments:  Angela Johnson, RN 12/18/2015, 1:03 PM  

## 2015-12-18 NOTE — Consult Note (Signed)
ORTHOPAEDIC CONSULTATION  REQUESTING PHYSICIAN: Alford Highland, MD  Chief Complaint: Left lower leg pain  HPI: [name] is a 60 y.o. male who complains of  Left lower leg pain.  Admitted through ER with cellulitis.  Welll known to me with foot ulcer.  Denies any recent trauma but underwent ORIF tibial fx in 2008 per pt.    Past Medical History  Diagnosis Date  . COPD (chronic obstructive pulmonary disease) ([name])   . Hyperlipidemia   . Hypertension   . Type II diabetes mellitus ([name])   . Tobacco abuse     a. 46 yrs, up to 2 ppd, changed to e-cigarette 05/2013.  . Cardiomyopathy ([name])     a. 10/2013 Echo: EF 35-40%, mild MR/TR;  b. 11/2013 Lexi MV: EF 46%, fixed apical defect with small distal antsept ischemia->overall low-risk->med Rx.  . Secondary erythrocytosis     a. followed by heme-onc with periodic phlebotomy.  . CKD (chronic kidney disease), stage III     a. Creat 1.42 (10/2013).  . Ischemic toe     a. 10/2013 L fifth toe - felt to be either 2/2 embolic plaque vs erythrocytosis - seen by vascular surgery (D[facility]) ->conservative rx.  Marland Kitchen LBBB (left bundle branch block)   . Collagen vascular disease ([name])   . CHF (congestive heart failure) ([name])   . Peripheral neuropathy [facility])    Past Surgical History  Procedure Laterality Date  . Orif tibia fracture Left 2008  . I&d extremity Left 01/18/2015    Procedure: IRRIGATION AND DEBRIDEMENT EXTREMITY;  Surgeon: Gwyneth Revels, DPM;  Location: [facility] ORS;  Service: Podiatry;  Laterality: Left;  . Wound exploration Left 03/10/2015    Procedure: WOUND EXPLORATION/ SECONDARY CLOSURE OF WOUND,COMPLICATED;  Surgeon: Gwyneth Revels, DPM;  Location: [facility] ORS;  Service: Podiatry;  Laterality: Left;   Social History   Social History  . Marital Status: Significant Other    Spouse Name: N/A  . Number of Children: N/A  . Years of Education: N/A   Social History Main Topics  . Smoking status: Current Every Day Smoker -- 0.25 packs/day for 46  years    Types: Cigarettes, E-cigarettes  . Smokeless tobacco: Current User     Comment: Currently smoking E cigarettes.  . Alcohol Use: No     Comment: alcohol abuse in the past.   . Drug Use: No     Comment: marijuana 5 joints per week in the past 2013  . Sexual Activity: Not Asked   Other Topics Concern  . None   Social History Narrative   He lives in [place] with his dtr and son-in-law.  On disability.  Does not routinely exercise.  Eats 6 scoops of banana ice cream every night.   Family History  Problem Relation Age of Onset  . Hypertension Mother     alive @ 79  . Hyperlipidemia Mother   . Hypertension Brother   . Hyperlipidemia Brother   . Hypertension Maternal Aunt   . Hypertension Brother   . Hyperlipidemia Brother   . Hypertension Brother   . Hyperlipidemia Brother   . Diabetes Brother    No Known Allergies Prior to Admission medications   Medication Sig Start Date End Date Taking? Authorizing Provider  albuterol (PROVENTIL HFA;VENTOLIN HFA) 108 (90 BASE) MCG/ACT inhaler Inhale 2 puffs into the lungs every 4 (four) hours as needed for wheezing or shortness of breath.   Yes Historical Provider, MD  aspirin 81 MG tablet Take 81 mg by  mouth at bedtime.    Yes Historical Provider, MD  clopidogrel (PLAVIX) 75 MG tablet Take 75 mg by mouth daily with breakfast.   Yes Historical Provider, MD  glipiZIDE (GLUCOTROL XL) 10 MG 24 hr tablet Take 10 mg by mouth daily. 10/10/15  Yes Historical Provider, MD  isosorbide mononitrate (IMDUR) 30 MG 24 hr tablet Take 0.5 tablets (15 mg total) by mouth daily. 03/14/15  Yes Antonieta Iba, MD  linagliptin (TRADJENTA) 5 MG TABS tablet Take 5 mg by mouth every morning.    Yes Historical Provider, MD  lisinopril (PRINIVIL,ZESTRIL) 20 MG tablet Take 10 mg by mouth every morning.    Yes Historical Provider, MD  loratadine (CLARITIN) 10 MG tablet Take 10 mg by mouth daily as needed for allergies (seasonal allergies in the spring).    Yes  Historical Provider, MD  lovastatin (MEVACOR) 40 MG tablet Take 40 mg by mouth at bedtime.   Yes Historical Provider, MD  metoprolol tartrate (LOPRESSOR) 25 MG tablet Take 1 tablet (25 mg total) by mouth 2 (two) times daily. 09/01/14  Yes Antonieta Iba, MD  nitroGLYCERIN (NITROSTAT) 0.4 MG SL tablet Place 1 tablet (0.4 mg total) under the tongue every 5 (five) minutes as needed for chest pain. 12/28/13  Yes Ok Anis, NP  pregabalin (LYRICA) 100 MG capsule Take 100 mg by mouth 3 (three) times daily.   Yes Historical Provider, MD  sertraline (ZOLOFT) 100 MG tablet Take 100 mg by mouth at bedtime.   Yes Historical Provider, MD  traZODone (DESYREL) 150 MG tablet TAKE 1 TABLET BY MOUTH AT BEDTIME. 09/13/14  Yes Antonieta Iba, MD  diazepam (VALIUM) 2 MG tablet Take 1 tablet (2 mg total) by mouth every 8 (eight) hours as needed for muscle spasms. Patient not taking: Reported on 03/10/2015 01/09/15 01/09/16  Darien Ramus, MD  HYDROcodone-acetaminophen [facility]) 5-325 MG per tablet Take 1 tablet by mouth every 6 (six) hours as needed for moderate pain. 03/10/15   Gwyneth Revels, DPM   US Venous Img Lower Unilateral Left  12/17/2015  CLINICAL DATA:  Left lower extremity swelling and redness EXAM: Left LOWER EXTREMITY VENOUS DOPPLER ULTRASOUND TECHNIQUE: Gray-scale sonography with graded compression, as well as color Doppler and duplex ultrasound were performed to evaluate the lower extremity deep venous systems from the level of the common femoral vein and including the common femoral, femoral, profunda femoral, popliteal and calf veins including the posterior tibial, peroneal and gastrocnemius veins when visible. The superficial great saphenous vein was also interrogated. Spectral Doppler was utilized to evaluate flow at rest and with distal augmentation maneuvers in the common femoral, femoral and popliteal veins. COMPARISON:  None. FINDINGS: Contralateral Common Femoral Vein: Respiratory phasicity is  normal and symmetric with the symptomatic side. No evidence of thrombus. Normal compressibility. Common Femoral Vein: No evidence of thrombus. Normal compressibility, respiratory phasicity and response to augmentation. Saphenofemoral Junction: No evidence of thrombus. Normal compressibility and flow on color Doppler imaging. Profunda Femoral Vein: No evidence of thrombus. Normal compressibility and flow on color Doppler imaging. Femoral Vein: No evidence of thrombus. Normal compressibility, respiratory phasicity and response to augmentation. Popliteal Vein: No evidence of thrombus. Normal compressibility, respiratory phasicity and response to augmentation. Calf Veins: No evidence of thrombus. Normal compressibility and flow on color Doppler imaging. Superficial Great Saphenous Vein: No evidence of thrombus. Normal compressibility and flow on color Doppler imaging. Venous Reflux:  None. Other Findings:  None. IMPRESSION: No evidence of deep venous thrombosis left  lower extremity. Electronically Signed   By: Natasha Mead M.D.   On: 12/17/2015 16:58    Positive ROS: All other systems have been reviewed and were otherwise negative with the exception of those mentioned in the HPI and as above.  12 point ROS was performed.  Physical Exam: General: Alert and oriented.  No apparent distress.  Vascular:  Left foot:Dorsalis Pedis:  diminished Posterior Tibial:  diminished  Right foot: Dorsalis Pedis:  diminished Posterior Tibial:  diminished  Neuro:absent protective sensation  Derm:Diffuse erythema to proximal 1/2 of lower leg anterior. Painful with palpation with edema.  Pain with ROM to knee. Ulcer to 1st mtpj is stable without drainage.Ulcer is basically closed with mild superficial callus.  No surrounding erythema. No active infection to this area.  Ortho/MS: Pain to anterior left lower leg and ROM to knee.   Assessment: Cellulitis left lower leg  Plan: No active infection to ulcer left 1st mtpj.No  intervention needed to this site currently. Does have left anterior proximal tib/fib pain as well as pain with ROM with hx of remote tibial fx.  My need Ortho evaluation. F/U with me outpt.    Irean Hong, DPM Cell [phone]   12/18/2015 4:45 PM

## 2015-12-18 NOTE — Progress Notes (Signed)
Patient ID: [name], male   DOB: 08/16/1955, 60 y.o.   MRN: [id number] [facility] PROGRESS NOTE  KARION CUDD VFI:[id number] DOB: 10-09-1955 DOA: 12/17/2015 PCP: No PCP Per Patient  HPI/Subjective: Patient having pain in the left lower extremity. The other day he had a fall and he felt like he heard something in his knee. He started noticing some redness and pain with walking.  Objective: Filed Vitals:   12/18/15 0406 12/18/15 0731  BP: 95/58 103/64  Pulse: 74 67  Temp: 97.6 F (36.4 C) 97.6 F (36.4 C)  Resp: 14 16    Filed Weights   12/18/15 0428 12/18/15 0429  Weight: 160.818 kg (354 lb 8.6 oz) 160.818 kg (354 lb 8.6 oz)    ROS: Review of Systems  Constitutional: Negative for fever and chills.  Eyes: Negative for blurred vision.  Respiratory: Negative for cough and shortness of breath.   Cardiovascular: Negative for chest pain.  Gastrointestinal: Negative for nausea, vomiting, abdominal pain, diarrhea and constipation.  Genitourinary: Negative for dysuria.  Musculoskeletal: Positive for joint pain.  Neurological: Negative for dizziness and headaches.   Exam: Physical Exam  Constitutional: He is oriented to person, place, and time.  HENT:  Nose: No mucosal edema.  Mouth/Throat: No oropharyngeal exudate or posterior oropharyngeal edema.  Eyes: Conjunctivae, EOM and lids are normal. Pupils are equal, round, and reactive to light.  Neck: No JVD present. Carotid bruit is not present. No edema present. No thyroid mass and no thyromegaly present.  Cardiovascular: S1 normal and S2 normal.  Exam reveals no gallop.   No murmur heard. Pulses:      Dorsalis pedis pulses are 2+ on the right side, and 2+ on the left side.  Respiratory: No respiratory distress. He has no wheezes. He has no rhonchi. He has no rales.  GI: Soft. Bowel sounds are normal. There is no tenderness.  Musculoskeletal:       Right shoulder: He exhibits no swelling.       Left knee: He  exhibits decreased range of motion and swelling.  Patient able to bend his left knee on his own  Lymphadenopathy:    He has no cervical adenopathy.  Neurological: He is alert and oriented to person, place, and time. No cranial nerve deficit.  Skin: Skin is warm. No rash noted. Nails show no clubbing.  Left lower extremity erythema lateral left knee and into the shin anteriorly and laterally. Warmth felt also some tenderness.  Psychiatric: He has a normal mood and affect.      Data Reviewed: Basic Metabolic Panel:  Recent Labs Lab 12/17/15 1556 12/18/15 0254  NA 134* 135  K 4.1 3.7  CL 99* 100*  CO2 29 30  GLUCOSE 217* 188*  BUN 12 13  CREATININE 1.39* 1.58*  CALCIUM 8.5* 8.0*   Liver Function Tests:  Recent Labs Lab 12/17/15 1556 12/18/15 0254  AST 30 25  ALT 18 16*  ALKPHOS 106 100  BILITOT 0.6 1.0  PROT 8.0 7.7  ALBUMIN 3.1* 3.0*   No results for input(s): LIPASE, AMYLASE in the last 168 hours. No results for input(s): AMMONIA in the last 168 hours. CBC:  Recent Labs Lab 12/17/15 1556 12/18/15 0254  WBC 10.9* 10.0  NEUTROABS 8.0*  --   HGB 13.2 12.7*  HCT 41.2 40.3  MCV 70.8* 71.5*  PLT 152 154    CBG:  Recent Labs Lab 12/17/15 2100 12/18/15 0735 12/18/15 1107  GLUCAP 151* 158* 192*  Recent Results (from the past 240 hour(s))  Culture, blood (Routine X 2) w Reflex to ID Panel     Status: None (Preliminary result)   Collection Time: 12/17/15  3:56 PM  Result Value Ref Range Status   Specimen Description BLOOD RIGHT ASSIST CONTROL  Final   Special Requests BOTTLES DRAWN AEROBIC AND ANAEROBIC 1 CC  Final   Culture NO GROWTH < 24 HOURS  Final   Report Status PENDING  Incomplete  Culture, blood (Routine X 2) w Reflex to ID Panel     Status: None (Preliminary result)   Collection Time: 12/17/15  3:56 PM  Result Value Ref Range Status   Specimen Description BLOOD LEFT ASSIST CONTROL  Final   Special Requests BOTTLES DRAWN AEROBIC AND  ANAEROBIC 1 CC  Final   Culture NO GROWTH < 24 HOURS  Final   Report Status PENDING  Incomplete     Studies: US Venous Img Lower Unilateral Left  12/17/2015  CLINICAL DATA:  Left lower extremity swelling and redness EXAM: Left LOWER EXTREMITY VENOUS DOPPLER ULTRASOUND TECHNIQUE: Gray-scale sonography with graded compression, as well as color Doppler and duplex ultrasound were performed to evaluate the lower extremity deep venous systems from the level of the common femoral vein and including the common femoral, femoral, profunda femoral, popliteal and calf veins including the posterior tibial, peroneal and gastrocnemius veins when visible. The superficial great saphenous vein was also interrogated. Spectral Doppler was utilized to evaluate flow at rest and with distal augmentation maneuvers in the common femoral, femoral and popliteal veins. COMPARISON:  None. FINDINGS: Contralateral Common Femoral Vein: Respiratory phasicity is normal and symmetric with the symptomatic side. No evidence of thrombus. Normal compressibility. Common Femoral Vein: No evidence of thrombus. Normal compressibility, respiratory phasicity and response to augmentation. Saphenofemoral Junction: No evidence of thrombus. Normal compressibility and flow on color Doppler imaging. Profunda Femoral Vein: No evidence of thrombus. Normal compressibility and flow on color Doppler imaging. Femoral Vein: No evidence of thrombus. Normal compressibility, respiratory phasicity and response to augmentation. Popliteal Vein: No evidence of thrombus. Normal compressibility, respiratory phasicity and response to augmentation. Calf Veins: No evidence of thrombus. Normal compressibility and flow on color Doppler imaging. Superficial Great Saphenous Vein: No evidence of thrombus. Normal compressibility and flow on color Doppler imaging. Venous Reflux:  None. Other Findings:  None. IMPRESSION: No evidence of deep venous thrombosis left lower extremity.  Electronically Signed   By: Natasha Mead M.D.   On: 12/17/2015 16:58    Scheduled Meds: . aspirin EC  81 mg Oral QHS  . clopidogrel  75 mg Oral Q breakfast  . docusate sodium  100 mg Oral BID  . glipiZIDE  10 mg Oral Q breakfast  . heparin  5,000 Units Subcutaneous Q8H  . insulin aspart  0-9 Units Subcutaneous TID WC  . isosorbide mononitrate  15 mg Oral Daily  . linagliptin  5 mg Oral BH-q7a  . lisinopril  10 mg Oral BH-q7a  . metoprolol tartrate  25 mg Oral BID  . nicotine  21 mg Transdermal Daily  . pantoprazole  40 mg Oral Daily  . piperacillin-tazobactam (ZOSYN)  IV  3.375 g Intravenous Q8H  . pravastatin  40 mg Oral q1800  . pregabalin  100 mg Oral TID  . sertraline  100 mg Oral QHS  . traZODone  150 mg Oral QHS  . vancomycin  1,000 mg Intravenous Q12H    Assessment/Plan:  1. Left lower extremity cellulitis. Patient put on aggressive  antibiotics by admitting physician with vancomycin and Zosyn. No DVT seen on ultrasound. 2. Chronic kidney disease stage III. Continue to monitor. 3. Essential hypertension. Blood pressure on the lower side. Hold lisinopril. 4. History of COPD respiratory status stable 5. History of congestive heart failure I will stop IV fluids at this point 6. Hyperlipidemia unspecified on pravastatin 7. Type 2 diabetes on glipizide. I will add sliding scale 8. Depression on Zoloft and trazodone  Code Status:     Code Status Orders        Start     Ordered   12/17/15 1918  Full code   Continuous     12/17/15 1917    Code Status History    Date Active Date Inactive Code Status Order ID Comments User Context   03/10/2015  8:56 AM 03/10/2015  1:27 PM Full Code [id number]  Gwyneth Revels, DPM Inpatient   01/17/2015 10:28 PM 01/20/2015  2:37 PM Full Code [id number]  Oralia Manis, MD Inpatient     Family Communication: Family at bedside Disposition Plan: Home once swelling and pain less  Antibiotics:  Vancomycin  Zosyn  Time spent: 25  minutes  Alford Highland  [employer]

## 2015-12-18 NOTE — Consult Note (Signed)
Pharmacy Antibiotic Note  [name] is a 60 y.o. male admitted on 12/17/2015 with cellulitis.  Pharmacy has been consulted for vancomycin dosing.  Plan: Patient received 1 g of vancomycin in ED and was initiated on vancomycin 2 g IV daily on admission. Patient's CrCl is ~80 mL/min with ke of 0.07 and half-life of ~10 hours. VD 45L.   Based on kinetics and calculated half-life, will change dose to vancomycin 1000 mg IV q12h beginning at 1800 tonight (18 hours after the 2 g dose - vanc level should be <15 mcg/mL at that time). Goal vanc trough 10-15 mcg/mL Vanc trough ordered for 5/17 @ 1730 which should represent steady state  Patient is at risk for accumulation due to morbid obesity.  Height: 6' (182.9 cm) Weight: (!) 354 lb 8.6 oz (160.818 kg) IBW/kg (Calculated) : 77.6  Temp (24hrs), Avg:97.7 F (36.5 C), Min:97.6 F (36.4 C), Max:97.8 F (36.6 C)   Recent Labs Lab 12/17/15 1526 12/17/15 1556 12/17/15 1831 12/18/15 0254  WBC  --  10.9*  --  10.0  CREATININE  --  1.39*  --  1.58*  LATICACIDVEN 1.3  --  1.1  --     Estimated Creatinine Clearance: 79.9 mL/min (by C-G formula based on Cr of 1.58).    No Known Allergies  Antimicrobials this admission: vancomycin 5/14 >>  zosyn 5/14 >>   Dose adjustments this admission: 5/15 Changed from vanc 2 g daily to 1 g IV q12h  Microbiology results: 5/14 BCx: No growth <24 hrs  Thank you for allowing pharmacy to be a part of this patient's care.  Keturah Barre, Pharm.D Clinical Pharmacist   12/18/2015 8:23 AM

## 2015-12-19 LAB — GLUCOSE, CAPILLARY
GLUCOSE-CAPILLARY: 195 mg/dL — AB (ref 65–99)
GLUCOSE-CAPILLARY: 152 mg/dL — AB (ref 65–99)
Glucose-Capillary: 178 mg/dL — ABNORMAL HIGH (ref 65–99)
Glucose-Capillary: 182 mg/dL — ABNORMAL HIGH (ref 65–99)

## 2015-12-19 MED ORDER — ALUM & MAG HYDROXIDE-SIMETH 200-200-20 MG/5ML PO SUSP
30.0000 mL | Freq: Four times a day (QID) | ORAL | Status: DC | PRN
  Administered 2015-12-19: 30 mL via ORAL
  Filled 2015-12-19: qty 30

## 2015-12-19 MED ORDER — INSULIN ASPART 100 UNIT/ML ~~LOC~~ SOLN: 0.0000 [IU] | Freq: Three times a day (TID) | SUBCUTANEOUS | Status: DC

## 2015-12-19 MED ORDER — ENOXAPARIN SODIUM 40 MG/0.4ML ~~LOC~~ SOLN
40.0000 mg | Freq: Two times a day (BID) | SUBCUTANEOUS | Status: DC
  Administered 2015-12-19 – 2015-12-21 (×4): 40 mg via SUBCUTANEOUS
  Filled 2015-12-19 (×4): qty 0.4

## 2015-12-19 MED ORDER — INSULIN ASPART 100 UNIT/ML ~~LOC~~ SOLN: 0.0000 [IU] | Freq: Every day | SUBCUTANEOUS | Status: DC

## 2015-12-19 NOTE — Care Management Important Message (Signed)
Important Message  Patient Details  Name: [name] MRN: [id number] Date of Birth: 13-Sep-1955   Medicare Important Message Given:  Yes    Olegario Messier A Kahliyah Dick 12/19/2015, 3:49 PM

## 2015-12-19 NOTE — Progress Notes (Signed)
Patient ID: [name], male   DOB: 12/12/1955, 60 y.o.   MRN: [id number] [facility] PROGRESS NOTE  ARMANI BRAR WUJ:[id number] DOB: 1956/07/17 DOA: 12/17/2015 PCP: No PCP Per Patient  HPI/Subjective: Patient having pain in the left lower extremity. The patient had pain last night with walking around.  Objective: Filed Vitals:   12/19/15 0359 12/19/15 0726  BP: 113/68 108/67  Pulse: 70 74  Temp: 97.5 F (36.4 C) 98 F (36.7 C)  Resp: 20 18    Filed Weights   12/18/15 0429 12/18/15 0608 12/19/15 0500  Weight: 160.818 kg (354 lb 8.6 oz) 160.573 kg (354 lb) 160.755 kg (354 lb 6.4 oz)    ROS: Review of Systems  Constitutional: Negative for fever and chills.  Eyes: Negative for blurred vision.  Respiratory: Negative for cough and shortness of breath.   Cardiovascular: Negative for chest pain.  Gastrointestinal: Negative for nausea, vomiting, abdominal pain, diarrhea and constipation.  Genitourinary: Negative for dysuria.  Musculoskeletal: Positive for joint pain.  Neurological: Negative for dizziness and headaches.   Exam: Physical Exam  Constitutional: He is oriented to person, place, and time.  HENT:  Nose: No mucosal edema.  Mouth/Throat: No oropharyngeal exudate or posterior oropharyngeal edema.  Eyes: Conjunctivae, EOM and lids are normal. Pupils are equal, round, and reactive to light.  Neck: No JVD present. Carotid bruit is not present. No edema present. No thyroid mass and no thyromegaly present.  Cardiovascular: S1 normal and S2 normal.  Exam reveals no gallop.   No murmur heard. Pulses:      Dorsalis pedis pulses are 2+ on the right side, and 2+ on the left side.  Respiratory: No respiratory distress. He has no wheezes. He has no rhonchi. He has no rales.  GI: Soft. Bowel sounds are normal. There is no tenderness.  Musculoskeletal:       Right shoulder: He exhibits no swelling.       Left knee: He exhibits decreased range of motion and swelling.   Patient able to bend his left knee on his own  Lymphadenopathy:    He has no cervical adenopathy.  Neurological: He is alert and oriented to person, place, and time. No cranial nerve deficit.  Skin: Skin is warm. No rash noted. Nails show no clubbing.  Left lower extremity erythema lateral left knee and into the shin anteriorly and laterally. Warmth felt also some tenderness.  Psychiatric: He has a normal mood and affect.      Data Reviewed: Basic Metabolic Panel:  Recent Labs Lab 12/17/15 1556 12/18/15 0254  NA 134* 135  K 4.1 3.7  CL 99* 100*  CO2 29 30  GLUCOSE 217* 188*  BUN 12 13  CREATININE 1.39* 1.58*  CALCIUM 8.5* 8.0*   Liver Function Tests:  Recent Labs Lab 12/17/15 1556 12/18/15 0254  AST 30 25  ALT 18 16*  ALKPHOS 106 100  BILITOT 0.6 1.0  PROT 8.0 7.7  ALBUMIN 3.1* 3.0*   CBC:  Recent Labs Lab 12/17/15 1556 12/18/15 0254  WBC 10.9* 10.0  NEUTROABS 8.0*  --   HGB 13.2 12.7*  HCT 41.2 40.3  MCV 70.8* 71.5*  PLT 152 154    CBG:  Recent Labs Lab 12/18/15 1107 12/18/15 1654 12/18/15 2105 12/19/15 0745 12/19/15 1100  GLUCAP 192* 171* 226* 195* 178*    Recent Results (from the past 240 hour(s))  Culture, blood (Routine X 2) w Reflex to ID Panel     Status: None (Preliminary  result)   Collection Time: 12/17/15  3:56 PM  Result Value Ref Range Status   Specimen Description BLOOD RIGHT ASSIST CONTROL  Final   Special Requests BOTTLES DRAWN AEROBIC AND ANAEROBIC 1 CC  Final   Culture NO GROWTH 2 DAYS  Final   Report Status PENDING  Incomplete  Culture, blood (Routine X 2) w Reflex to ID Panel     Status: None (Preliminary result)   Collection Time: 12/17/15  3:56 PM  Result Value Ref Range Status   Specimen Description BLOOD LEFT ASSIST CONTROL  Final   Special Requests BOTTLES DRAWN AEROBIC AND ANAEROBIC 1 CC  Final   Culture NO GROWTH 2 DAYS  Final   Report Status PENDING  Incomplete     Studies: US Venous Img Lower Unilateral  Left  12/17/2015  CLINICAL DATA:  Left lower extremity swelling and redness EXAM: Left LOWER EXTREMITY VENOUS DOPPLER ULTRASOUND TECHNIQUE: Gray-scale sonography with graded compression, as well as color Doppler and duplex ultrasound were performed to evaluate the lower extremity deep venous systems from the level of the common femoral vein and including the common femoral, femoral, profunda femoral, popliteal and calf veins including the posterior tibial, peroneal and gastrocnemius veins when visible. The superficial great saphenous vein was also interrogated. Spectral Doppler was utilized to evaluate flow at rest and with distal augmentation maneuvers in the common femoral, femoral and popliteal veins. COMPARISON:  None. FINDINGS: Contralateral Common Femoral Vein: Respiratory phasicity is normal and symmetric with the symptomatic side. No evidence of thrombus. Normal compressibility. Common Femoral Vein: No evidence of thrombus. Normal compressibility, respiratory phasicity and response to augmentation. Saphenofemoral Junction: No evidence of thrombus. Normal compressibility and flow on color Doppler imaging. Profunda Femoral Vein: No evidence of thrombus. Normal compressibility and flow on color Doppler imaging. Femoral Vein: No evidence of thrombus. Normal compressibility, respiratory phasicity and response to augmentation. Popliteal Vein: No evidence of thrombus. Normal compressibility, respiratory phasicity and response to augmentation. Calf Veins: No evidence of thrombus. Normal compressibility and flow on color Doppler imaging. Superficial Great Saphenous Vein: No evidence of thrombus. Normal compressibility and flow on color Doppler imaging. Venous Reflux:  None. Other Findings:  None. IMPRESSION: No evidence of deep venous thrombosis left lower extremity. Electronically Signed   By: Natasha Mead M.D.   On: 12/17/2015 16:58    Scheduled Meds: . aspirin EC  81 mg Oral QHS  . clopidogrel  75 mg Oral Q  breakfast  . docusate sodium  100 mg Oral BID  . enoxaparin (LOVENOX) injection  40 mg Subcutaneous Q12H  . glipiZIDE  10 mg Oral Q breakfast  . insulin aspart  0-5 Units Subcutaneous QHS  . insulin aspart  0-9 Units Subcutaneous TID WC  . isosorbide mononitrate  15 mg Oral Daily  . linagliptin  5 mg Oral BH-q7a  . metoprolol tartrate  25 mg Oral BID  . nicotine  21 mg Transdermal Daily  . pantoprazole  40 mg Oral Daily  . piperacillin-tazobactam (ZOSYN)  IV  3.375 g Intravenous Q8H  . pravastatin  40 mg Oral q1800  . pregabalin  100 mg Oral TID  . sertraline  100 mg Oral QHS  . traZODone  150 mg Oral QHS  . vancomycin  1,000 mg Intravenous Q12H    Assessment/Plan:  1. Left lower extremity cellulitis. On vancomycin and Zosyn. No DVT seen on ultrasound. 2. Chronic kidney disease stage III. Continue to monitor. 3. Essential hypertension. Blood pressure on the lower side. Hold  lisinopril. 4. History of COPD respiratory status stable 5. History of congestive heart failure I will stop IV fluids at this point 6. Hyperlipidemia unspecified on pravastatin 7. Type 2 diabetes on glipizide. I will add sliding scale 8. Depression on Zoloft and trazodone  Code Status:     Code Status Orders        Start     Ordered   12/17/15 1918  Full code   Continuous     12/17/15 1917    Code Status History    Date Active Date Inactive Code Status Order ID Comments User Context   03/10/2015  8:56 AM 03/10/2015  1:27 PM Full Code [id number]  Gwyneth Revels, DPM Inpatient   01/17/2015 10:28 PM 01/20/2015  2:37 PM Full Code [id number]  Oralia Manis, MD Inpatient     Family Communication: Family at bedside Disposition Plan: Home once swelling and pain less  Antibiotics:  Vancomycin  Zosyn  Time spent: 24 minutes  Alford Highland  [employer]

## 2015-12-19 NOTE — Progress Notes (Signed)
Pt complained of indigestion, denies chest pain. On call Dr. Anne Hahn paged and made aware, ordered for PRN Maalox, will administer and continue to monitor.

## 2015-12-19 NOTE — Evaluation (Signed)
Physical Therapy Evaluation Patient Details Name: [name] MRN: [id number] DOB: 1955/12/22 Today's Date: 12/19/2015   History of Present Illness  [name] is a 60 y.o. male has a past medical history significant for DM with neuropathy, morbid obesity, HTN, and CAD. Now with 1 week hx of progressive LLE swelling, pain, and redness. Has a chronic wound to left foot followed by Podiatry. Has known diabetes with sever neuropathy. Presents to ER with no fever but with leukocytosis and severe warmth, redness, swelling of LLE. Pt unable to walk due to pain. He is now admitted. No N/V/D. Denies Cp or SOB. Denies falls over the last 12 months  Clinical Impression  Pt reporting pain in L lower leg with all mobility. Pain is worse with WB and pt localizes pain over scar from old ORIF (2008 per medical record). No known trauma to leg however onset was insidious. Difficult to determine if this is due to cellulitis but agree with podiatry notes that ortho consult would be reasonable given presence of hardware in leg. RN notified. Pt demonstrates antalgic gait with heavy UE support. He is able to ambulate functional household distances and has a ramp to enter home. Pt encouraged to utilize rolling walker at discharge to offload LLE and he will confirm with family that he has one at home. Otherwise pt will need a wheel kit for his standard walker or a new bariatric rolling walker. He has had a decline in his mobility since his nonhealing L foot ulcer and now with his LLE cellulitis. Would benefit from [facility] for strength and gait training. Pt will benefit from skilled PT services to address deficits in strength, balance, and mobility in order to return to full function at home.     Follow Up Recommendations Home health PT    Equipment Recommendations  Other (comment) (Use rolling walker. Pt to confirm that he has one at home)    Recommendations for Other Services       Precautions / Restrictions  Precautions Precautions: Fall Restrictions Weight Bearing Restrictions: No      Mobility  Bed Mobility Overal bed mobility: Independent             General bed mobility comments: Good speed/sequencing  Transfers Overall transfer level: Needs assistance Equipment used: Rolling walker (2 wheeled) Transfers: Sit to/from Stand Sit to Stand: Supervision         General transfer comment: Pt demonstrates fair speed and sequencing. Decreased weight shift to LLE during transfer. Stable in standing with bilateral UE support  Ambulation/Gait Ambulation/Gait assistance: Min guard Ambulation Distance (Feet): 120 Feet Assistive device: Rolling walker (2 wheeled) Gait Pattern/deviations: Decreased step length - right;Decreased stance time - left;Decreased weight shift to left;Antalgic   Gait velocity interpretation: Below normal speed for age/gender General Gait Details: Pt ambulates with decreased weight shift to LLE due to pain. Gait speed and step length decreased. Pt reports mild DOE and Sao2 drops to 91% on room air. Pt easily fatigued and requires 1 standing rest break  Stairs            Wheelchair Mobility    Modified Rankin (Stroke Patients Only)       Balance Overall balance assessment: Needs assistance Sitting-balance support: No upper extremity supported Sitting balance-Leahy Scale: Good     Standing balance support: No upper extremity supported Standing balance-Leahy Scale: Fair Standing balance comment: Fair balance in wide and narrow stance. Negative Rhomberg. Did not attempt single leg balance testing  as pt does not appear safe to attempt currently                             Pertinent Vitals/Pain Pain Assessment: 0-10 Pain Score: 8  Pain Location: L leg, over site of cellulits and old ORIF (tib/fib 2008) Pain Intervention(s): Monitored during session    [place] expects to be discharged to:: Private residence Living  Arrangements: Spouse/significant other;Other (Comment);Alone Available Help at Discharge: Family Type of Home: House Home Access: Ramped entrance     Home Layout: One level Home Equipment: Walker - standard;Other (comment);Bedside commode;Wheelchair - manual (Believes he may have rolling walker but will check)      Prior Function Level of Independence: Independent with assistive device(s)         Comments: Intermittent use of walker due to chronic L foot ulcer.      Hand Dominance   Dominant Hand: Right    Extremity/Trunk Assessment   Upper Extremity Assessment: Overall WFL for tasks assessed           Lower Extremity Assessment: Overall WFL for tasks assessed         Communication   Communication: No difficulties  Cognition Arousal/Alertness: Awake/alert Behavior During Therapy: WFL for tasks assessed/performed Overall Cognitive Status: Within Functional Limits for tasks assessed                      General Comments General comments (skin integrity, edema, etc.): Redness noted on L lateral leg just distal to knee and near scars from old ORIF    Exercises        Assessment/Plan    PT Assessment Patient needs continued PT services  PT Diagnosis Difficulty walking;Acute pain   PT Problem List Decreased strength;Decreased balance;Decreased mobility;Decreased safety awareness;Obesity;Pain;Decreased skin integrity  PT Treatment Interventions DME instruction;Gait training;Stair training;Therapeutic activities;Therapeutic exercise;Balance training;Neuromuscular re-education   PT Goals (Current goals can be found in the Care Plan section) Acute Rehab PT Goals Patient Stated Goal: To improve his function at home PT Goal Formulation: With patient/family Time For Goal Achievement: 01/02/16 Potential to Achieve Goals: Fair    Frequency Min 2X/week   Barriers to discharge        Co-evaluation               End of Session Equipment Utilized  During Treatment: Gait belt Activity Tolerance: Patient limited by pain Patient left: in bed;with call bell/phone within reach;with family/visitor present Nurse Communication: Mobility status;Other (comment) (Possible ortho consult for LLE pain near old ORIF,)         Time: 1110-1125 PT Time Calculation (min) (ACUTE ONLY): 15 min   Charges:   PT Evaluation $PT Eval Low Complexity: 1 Procedure     PT G Codes:       Lyndel Safe Huldah Marin PT, DPT   Eldana Isip 12/19/2015, 11:42 AM

## 2015-12-19 NOTE — Care Management (Signed)
Referral made  to [facility]

## 2015-12-20 LAB — BASIC METABOLIC PANEL
ANION GAP: 5 (ref 5–15)
BUN: 13 mg/dL (ref 6–20)
CHLORIDE: 102 mmol/L (ref 101–111)
CO2: 29 mmol/L (ref 22–32)
Calcium: 8.2 mg/dL — ABNORMAL LOW (ref 8.9–10.3)
Creatinine, Ser: 1.39 mg/dL — ABNORMAL HIGH (ref 0.61–1.24)
GFR calc Af Amer: >60 mL/min (ref >60)
GFR, EST NON AFRICAN AMERICAN: 54 mL/min — AB (ref >60)
GLUCOSE: 167 mg/dL — AB (ref 65–99)
POTASSIUM: 3.9 mmol/L (ref 3.5–5.1)
Sodium: 136 mmol/L (ref 135–145)

## 2015-12-20 LAB — GLUCOSE, CAPILLARY
GLUCOSE-CAPILLARY: 167 mg/dL — AB (ref 65–99)
GLUCOSE-CAPILLARY: 191 mg/dL — AB (ref 65–99)
GLUCOSE-CAPILLARY: 192 mg/dL — AB (ref 65–99)
Glucose-Capillary: 184 mg/dL — ABNORMAL HIGH (ref 65–99)

## 2015-12-20 MED ORDER — CEFAZOLIN SODIUM-DEXTROSE 2-4 GM/100ML-% IV SOLN
2.0000 g | Freq: Four times a day (QID) | INTRAVENOUS | Status: DC
  Administered 2015-12-20 – 2015-12-21 (×6): 2 g via INTRAVENOUS
  Filled 2015-12-20 (×7): qty 100

## 2015-12-20 NOTE — Progress Notes (Signed)
Physical Therapy Treatment Patient Details Name: [name] MRN: [id number] DOB: October 04, 1955 Today's Date: 12/20/2015    History of Present Illness [name] is a 60 y.o. male has a past medical history significant for DM with neuropathy, morbid obesity, HTN, and CAD. Now with 1 week hx of progressive LLE swelling, pain, and redness. Has a chronic wound to left foot followed by Podiatry. Has known diabetes with sever neuropathy. Presents to ER with no fever but with leukocytosis and severe warmth, redness, swelling of LLE. Pt unable to walk due to pain. He is now admitted. No N/V/D. Denies Cp or SOB. Denies falls over the last 12 months    PT Comments    Pt demonstrates increase in ambulation distance on this date. He reports increase in LLE pain in standing however able to better tolerate today compared to yesterday. Pt able to perform a full lap around RN station while vitals remain WNL. Pt will benefit from skilled PT services to address deficits in strength, balance, and mobility in order to return to full function at home.    Follow Up Recommendations  Home health PT     Equipment Recommendations  Other (comment) (Use rolling walker. Pt to confirm that he has one at home)    Recommendations for Other Services       Precautions / Restrictions Precautions Precautions: Fall Restrictions Weight Bearing Restrictions: No    Mobility  Bed Mobility Overal bed mobility: Independent             General bed mobility comments: Good speed/sequencing  Transfers Overall transfer level: Needs assistance Equipment used: Rolling walker (2 wheeled) Transfers: Sit to/from Stand Sit to Stand: Supervision         General transfer comment: Pt demonstrates fair speed and sequencing. Stable in standing without bilateral UE support. Increase in L leg pain in standing  Ambulation/Gait Ambulation/Gait assistance: Min guard Ambulation Distance (Feet): 220 Feet Assistive  device: Rolling walker (2 wheeled) Gait Pattern/deviations: Antalgic;Decreased stance time - left;Decreased step length - right;Decreased weight shift to left   Gait velocity interpretation: Below normal speed for age/gender General Gait Details: Pt with increased pain during ambulation on LLE. He denies DOE and pt provided multiple standing rest breaks to assess vitals. HR increases to 100 bpm but SaO2 remains >90% on room air during entire ambulation distance. Pt provided cues to decreased WB through bilateral UEs   Stairs            Wheelchair Mobility    Modified Rankin (Stroke Patients Only)       Balance Overall balance assessment: Needs assistance Sitting-balance support: No upper extremity supported Sitting balance-Leahy Scale: Good     Standing balance support: No upper extremity supported Standing balance-Leahy Scale: Fair                      Cognition Arousal/Alertness: Awake/alert Behavior During Therapy: WFL for tasks assessed/performed Overall Cognitive Status: Within Functional Limits for tasks assessed                      Exercises      General Comments General comments (skin integrity, edema, etc.): Increased erythema noted in LLE      Pertinent Vitals/Pain Pain Assessment: 0-10 Pain Score: 10-Worst pain ever Pain Location: Bilateral foot pain. 7/10 L leg pain Pain Intervention(s): Monitored during session    Home Living  Prior Function            PT Goals (current goals can now be found in the care plan section) Acute Rehab PT Goals Patient Stated Goal: To improve his function at home PT Goal Formulation: With patient/family Time For Goal Achievement: 01/02/16 Potential to Achieve Goals: Fair Progress towards PT goals: Progressing toward goals    Frequency  Min 2X/week    PT Plan Current plan remains appropriate    Co-evaluation             End of Session Equipment Utilized  During Treatment: Gait belt Activity Tolerance: Patient limited by pain Patient left: in bed;with call bell/phone within reach;with family/visitor present     Time: 4259-5638 PT Time Calculation (min) (ACUTE ONLY): 9 min  Charges:  $Gait Training: 8-22 mins                    G Codes:      [name] PT, DPT   Shelbylynn Diaz 12/20/2015, 5:18 PM

## 2015-12-20 NOTE — Progress Notes (Signed)
Patient ID: [name], male   DOB: 03-14-1956, 60 y.o.   MRN: [id number] [facility] PROGRESS NOTE  RUDRA MINER LXB:[id number] DOB: August 21, 1955 DOA: 12/17/2015 PCP: No PCP Per Patient  HPI/Subjective: Patient having no pain when he is not walking around. 7 out of 10 pain when he does walk around. Erythema to the patient seems a little worse today. Still very swollen.  Objective: Filed Vitals:   12/20/15 0836 12/20/15 1143  BP: 120/58 116/75  Pulse: 66 63  Temp: 97.9 F (36.6 C) 97.7 F (36.5 C)  Resp: 18     Filed Weights   12/18/15 0608 12/19/15 0500 12/20/15 0500  Weight: 160.573 kg (354 lb) 160.755 kg (354 lb 6.4 oz) 162.161 kg (357 lb 8 oz)    ROS: Review of Systems  Constitutional: Negative for fever and chills.  Eyes: Negative for blurred vision.  Respiratory: Negative for cough and shortness of breath.   Cardiovascular: Negative for chest pain.  Gastrointestinal: Negative for nausea, vomiting, abdominal pain, diarrhea and constipation.  Genitourinary: Negative for dysuria.  Musculoskeletal: Positive for joint pain.  Neurological: Negative for dizziness and headaches.   Exam: Physical Exam  Constitutional: He is oriented to person, place, and time.  HENT:  Nose: No mucosal edema.  Mouth/Throat: No oropharyngeal exudate or posterior oropharyngeal edema.  Eyes: Conjunctivae, EOM and lids are normal. Pupils are equal, round, and reactive to light.  Neck: No JVD present. Carotid bruit is not present. No edema present. No thyroid mass and no thyromegaly present.  Cardiovascular: S1 normal and S2 normal.  Exam reveals no gallop.   No murmur heard. Pulses:      Dorsalis pedis pulses are 2+ on the right side, and 2+ on the left side.  Respiratory: No respiratory distress. He has no wheezes. He has no rhonchi. He has no rales.  GI: Soft. Bowel sounds are normal. There is no tenderness.  Musculoskeletal:       Right shoulder: He exhibits no swelling.    Left knee: He exhibits decreased range of motion and swelling.  Patient able to bend his left knee on his own  Lymphadenopathy:    He has no cervical adenopathy.  Neurological: He is alert and oriented to person, place, and time. No cranial nerve deficit.  Skin: Skin is warm. No rash noted. Nails show no clubbing.  Left lower extremity erythema lateral left knee and into the shin anteriorly and laterally. Warmth felt also some tenderness.  Psychiatric: He has a normal mood and affect.      Data Reviewed: Basic Metabolic Panel:  Recent Labs Lab 12/17/15 1556 12/18/15 0254 12/20/15 0355  NA 134* 135 136  K 4.1 3.7 3.9  CL 99* 100* 102  CO2 29 30 29   GLUCOSE 217* 188* 167*  BUN 12 13 13   CREATININE 1.39* 1.58* 1.39*  CALCIUM 8.5* 8.0* 8.2*   Liver Function Tests:  Recent Labs Lab 12/17/15 1556 12/18/15 0254  AST 30 25  ALT 18 16*  ALKPHOS 106 100  BILITOT 0.6 1.0  PROT 8.0 7.7  ALBUMIN 3.1* 3.0*   CBC:  Recent Labs Lab 12/17/15 1556 12/18/15 0254  WBC 10.9* 10.0  NEUTROABS 8.0*  --   HGB 13.2 12.7*  HCT 41.2 40.3  MCV 70.8* 71.5*  PLT 152 154    CBG:  Recent Labs Lab 12/19/15 1100 12/19/15 1616 12/19/15 2045 12/20/15 0743 12/20/15 1141  GLUCAP 178* 152* 182* 167* 184*    Recent Results (from the  past 240 hour(s))  Culture, blood (Routine X 2) w Reflex to ID Panel     Status: None (Preliminary result)   Collection Time: 12/17/15  3:56 PM  Result Value Ref Range Status   Specimen Description BLOOD RIGHT ASSIST CONTROL  Final   Special Requests BOTTLES DRAWN AEROBIC AND ANAEROBIC 1 CC  Final   Culture NO GROWTH 2 DAYS  Final   Report Status PENDING  Incomplete  Culture, blood (Routine X 2) w Reflex to ID Panel     Status: None (Preliminary result)   Collection Time: 12/17/15  3:56 PM  Result Value Ref Range Status   Specimen Description BLOOD LEFT ASSIST CONTROL  Final   Special Requests BOTTLES DRAWN AEROBIC AND ANAEROBIC 1 CC  Final    Culture NO GROWTH 2 DAYS  Final   Report Status PENDING  Incomplete      Scheduled Meds: . aspirin EC  81 mg Oral QHS  .  ceFAZolin (ANCEF) IV  2 g Intravenous Q6H  . clopidogrel  75 mg Oral Q breakfast  . enoxaparin (LOVENOX) injection  40 mg Subcutaneous Q12H  . glipiZIDE  10 mg Oral Q breakfast  . insulin aspart  0-5 Units Subcutaneous QHS  . insulin aspart  0-9 Units Subcutaneous TID WC  . isosorbide mononitrate  15 mg Oral Daily  . linagliptin  5 mg Oral BH-q7a  . metoprolol tartrate  25 mg Oral BID  . nicotine  21 mg Transdermal Daily  . pantoprazole  40 mg Oral Daily  . pravastatin  40 mg Oral q1800  . pregabalin  100 mg Oral TID  . sertraline  100 mg Oral QHS  . traZODone  150 mg Oral QHS    Assessment/Plan:  1. Left lower extremity cellulitis. I switched antibiotic to IV Ancef today. No DVT seen on ultrasound. Continue to monitor progress. 2. Chronic kidney disease stage III. Continue to monitor. 3. Essential hypertension. Blood pressure on the lower side. Hold lisinopril. 4. History of COPD respiratory status stable 5. History of congestive heart failure I will stop IV fluids at this point 6. Hyperlipidemia unspecified on pravastatin 7. Type 2 diabetes on glipizide. I will add sliding scale 8. Depression on Zoloft and trazodone  Code Status:     Code Status Orders        Start     Ordered   12/17/15 1918  Full code   Continuous     12/17/15 1917    Code Status History    Date Active Date Inactive Code Status Order ID Comments User Context   03/10/2015  8:56 AM 03/10/2015  1:27 PM Full Code [id number]  Gwyneth Revels, DPM Inpatient   01/17/2015 10:28 PM 01/20/2015  2:37 PM Full Code [id number]  Oralia Manis, MD Inpatient     Family Communication: Family at bedside Disposition Plan: Home once Erythema settles down.  Antibiotics:  Ancef  Time spent: 22 minutes  Alford Highland  [employer]

## 2015-12-21 LAB — GLUCOSE, CAPILLARY
Glucose-Capillary: 116 mg/dL — ABNORMAL HIGH (ref 65–99)
Glucose-Capillary: 223 mg/dL — ABNORMAL HIGH (ref 65–99)

## 2015-12-21 MED ORDER — NICOTINE 21 MG/24HR TD PT24: 21.0000 mg | MEDICATED_PATCH | Freq: Every day | TRANSDERMAL | Status: DC

## 2015-12-21 MED ORDER — CEPHALEXIN 500 MG PO CAPS: 500.0000 mg | ORAL_CAPSULE | Freq: Three times a day (TID) | ORAL | Status: DC

## 2015-12-21 MED ORDER — HYDROCODONE-ACETAMINOPHEN 5-325 MG PO TABS: 1.0000 | ORAL_TABLET | Freq: Four times a day (QID) | ORAL | Status: DC | PRN

## 2015-12-21 NOTE — Care Management Note (Addendum)
Case Management Note  Patient Details  Name: [name] MRN: [id number] Date of Birth: 03-20-1956  Subjective/Objective:         Dr Hilton Sinclair is ordering home health PT and RN.  [name] chose [facility]. A referral was texted to [employer] at [facility].  [name] already has all necessary DME at home.           Action/Plan:   Expected Discharge Date:                  Expected Discharge Plan:     In-House Referral:     Discharge planning Services  CM Consult  Post Acute Care Choice:  Home Health Choice offered to:  Patient  DME Arranged:    DME Agency:     HH Arranged:    HH Agency:     Status of Service:  In process, will continue to follow  Medicare Important Message Given:  Yes Date Medicare IM Given:    Medicare IM give by:    Date Additional Medicare IM Given:    Additional Medicare Important Message give by:     If discussed at Long Length of Stay Meetings, dates discussed:    Additional Comments:  Jeryl Wilbourn A, RN 12/21/2015, 10:41 AM

## 2015-12-21 NOTE — Discharge Summary (Signed)
[facility] - [place] at [facility]   PATIENT NAME: [name]    MR#:  [id number]  DATE OF BIRTH:  Oct 22, 1955  DATE OF ADMISSION:  12/17/2015 ADMITTING PHYSICIAN: Marguarite Arbour, MD  DATE OF DISCHARGE: 12/21/2015  PRIMARY CARE PHYSICIAN: No PCP Per Patient    ADMISSION DIAGNOSIS:  left knee pain  DISCHARGE DIAGNOSIS:  Principal Problem:   Cellulitis of left leg Active Problems:   Morbid obesity ([name])   Diabetes mellitus, type 2 ([name])   CKD (chronic kidney disease), stage III   Cellulitis   SECONDARY DIAGNOSIS:   Past Medical History  Diagnosis Date  . COPD (chronic obstructive pulmonary disease) ([name])   . Hyperlipidemia   . Hypertension   . Type II diabetes mellitus ([name])   . Tobacco abuse     a. 60 yrs, up to 2 ppd, changed to e-cigarette 05/2013.  . Cardiomyopathy ([name])     a. 10/2013 Echo: EF 35-40%, mild MR/TR;  b. 11/2013 Lexi MV: EF 46%, fixed apical defect with small distal antsept ischemia->overall low-risk->med Rx.  . Secondary erythrocytosis     a. followed by heme-onc with periodic phlebotomy.  . CKD (chronic kidney disease), stage III     a. Creat 1.42 (10/2013).  . Ischemic toe     a. 10/2013 L fifth toe - felt to be either 2/2 embolic plaque vs erythrocytosis - seen by vascular surgery (Dew) ->conservative rx.  Marland Kitchen LBBB (left bundle branch block)   . Collagen vascular disease ([name])   . CHF (congestive heart failure) ([name])   . Peripheral neuropathy ([name])     HOSPITAL COURSE:   1. Cellulitis left shin and lateral knee. Patient was admitted and placed on IV vancomycin and Zosyn. Cellulitis was slow to improve. I switched the antibiotics over to IV Ancef and that's when the cellulitis had improved. The patient will be switched over to by mouth Keflex for another week upon discharge home. 2. Chronic kidney disease stage III. Continue to monitor as outpatient 3. Essential hypertension blood pressure although lower side during the  hospital course. But blood pressure did come up upon discharge. Can restart lisinopril as outpatient. 4. History of COPD respiratory status stable 5. History of congestive heart failure but no signs at this point 6. Hyperlipidemia unspecified on pravastatin 7. Type 2 diabetes on glipizide 8. Depression on Zoloft and trazodone   DISCHARGE CONDITIONS:   Satisfactory  CONSULTS OBTAINED:  Treatment Team:  Gwyneth Revels, DPM  DRUG ALLERGIES:  No Known Allergies  DISCHARGE MEDICATIONS:   Current Discharge Medication List    START taking these medications   Details  cephALEXin (KEFLEX) 500 MG capsule Take 1 capsule (500 mg total) by mouth 3 (three) times daily. Qty: 21 capsule, Refills: 0    nicotine (NICODERM CQ - DOSED IN MG/24 HOURS) 21 mg/24hr patch Place 1 patch (21 mg total) onto the skin daily. Qty: 28 patch, Refills: 0      CONTINUE these medications which have CHANGED   Details  HYDROcodone-acetaminophen (NORCO) 5-325 MG tablet Take 1 tablet by mouth every 6 (six) hours as needed for moderate pain. Qty: 15 tablet, Refills: 0      CONTINUE these medications which have NOT CHANGED   Details  albuterol (PROVENTIL HFA;VENTOLIN HFA) 108 (90 BASE) MCG/ACT inhaler Inhale 2 puffs into the lungs every 4 (four) hours as needed for wheezing or shortness of breath.    aspirin 81 MG tablet Take 81 mg by mouth at bedtime.  clopidogrel (PLAVIX) 75 MG tablet Take 75 mg by mouth daily with breakfast.    glipiZIDE (GLUCOTROL XL) 10 MG 24 hr tablet Take 10 mg by mouth daily. Refills: 3    isosorbide mononitrate (IMDUR) 30 MG 24 hr tablet Take 0.5 tablets (15 mg total) by mouth daily. Qty: 90 tablet, Refills: 3    linagliptin (TRADJENTA) 5 MG TABS tablet Take 5 mg by mouth every morning.     lisinopril (PRINIVIL,ZESTRIL) 20 MG tablet Take 10 mg by mouth every morning.     loratadine (CLARITIN) 10 MG tablet Take 10 mg by mouth daily as needed for allergies (seasonal allergies  in the spring).     lovastatin (MEVACOR) 40 MG tablet Take 40 mg by mouth at bedtime.    metoprolol tartrate (LOPRESSOR) 25 MG tablet Take 1 tablet (25 mg total) by mouth 2 (two) times daily. Qty: 180 tablet, Refills: 3    nitroGLYCERIN (NITROSTAT) 0.4 MG SL tablet Place 1 tablet (0.4 mg total) under the tongue every 5 (five) minutes as needed for chest pain. Qty: 25 tablet, Refills: 3    pregabalin (LYRICA) 100 MG capsule Take 100 mg by mouth 3 (three) times daily.    sertraline (ZOLOFT) 100 MG tablet Take 100 mg by mouth at bedtime.    traZODone (DESYREL) 150 MG tablet TAKE 1 TABLET BY MOUTH AT BEDTIME. Qty: 30 tablet, Refills: 0      STOP taking these medications     diazepam (VALIUM) 2 MG tablet          DISCHARGE INSTRUCTIONS:   Follow-up with PMD as outpatient  If you experience worsening of your admission symptoms, develop shortness of breath, life threatening emergency, suicidal or homicidal thoughts you must seek medical attention immediately by calling 911 or calling your MD immediately  if symptoms less severe.  You Must read complete instructions/literature along with all the possible adverse reactions/side effects for all the Medicines you take and that have been prescribed to you. Take any new Medicines after you have completely understood and accept all the possible adverse reactions/side effects.   Please note  You were cared for by a hospitalist during your hospital stay. If you have any questions about your discharge medications or the care you received while you were in the hospital after you are discharged, you can call the unit and asked to speak with the hospitalist on call if the hospitalist that took care of you is not available. Once you are discharged, your primary care physician will handle any further medical issues. Please note that NO REFILLS for any discharge medications will be authorized once you are discharged, as it is imperative that you return  to your primary care physician (or establish a relationship with a primary care physician if you do not have one) for your aftercare needs so that they can reassess your need for medications and monitor your lab values.    Today   CHIEF COMPLAINT:   Chief Complaint  Patient presents with  . Knee Pain    HISTORY OF PRESENT ILLNESS:  Kaleb Boedeker  is a 60 y.o. male presented with knee pain and found to have a cellulitis   VITAL SIGNS:  Blood pressure 135/80, pulse 62, temperature 97.9 F (36.6 C), temperature source Oral, resp. rate 19, height 6' (1.829 m), weight 159.712 kg (352 lb 1.6 oz), SpO2 96 %.    PHYSICAL EXAMINATION:  GENERAL:  60 y.o.-year-old patient Sitting in the bed with no acute distress.  EYES: Pupils equal, round, reactive to light and accommodation. No scleral icterus. Extraocular muscles intact.  HEENT: Head atraumatic, normocephalic. Oropharynx and nasopharynx clear.  NECK:  Supple, no jugular venous distention. No thyroid enlargement, no tenderness.  LUNGS: Normal breath sounds bilaterally, no wheezing, rales,rhonchi or crepitation. No use of accessory muscles of respiration.  CARDIOVASCULAR: S1, S2 normal. No murmurs, rubs, or gallops.  ABDOMEN: Soft, non-tender, non-distended. Bowel sounds present. No organomegaly or mass.  EXTREMITIES: 2+ edema, no cyanosis, or clubbing.  NEUROLOGIC: Cranial nerves II through XII are intact. Muscle strength 5/5 in all extremities. Sensation intact. Gait not checked.  PSYCHIATRIC: The patient is alert and oriented x 3.  SKIN: Bilateral lower extremity chronic discoloration. Slight erythema lateral left knee extending down into the upper shin. This has faded since presentation.  DATA REVIEW:   CBC  Recent Labs Lab 12/18/15 0254  WBC 10.0  HGB 12.7*  HCT 40.3  PLT 154    Chemistries   Recent Labs Lab 12/18/15 0254 12/20/15 0355  NA 135 136  K 3.7 3.9  CL 100* 102  CO2 30 29  GLUCOSE 188* 167*  BUN 13  13  CREATININE 1.58* 1.39*  CALCIUM 8.0* 8.2*  AST 25  --   ALT 16*  --   ALKPHOS 100  --   BILITOT 1.0  --     Cardiac Enzymes No results for input(s): TROPONINI in the last 168 hours.  Microbiology Results  Results for orders placed or performed during the hospital encounter of 12/17/15  Culture, blood (Routine X 2) w Reflex to ID Panel     Status: None (Preliminary result)   Collection Time: 12/17/15  3:56 PM  Result Value Ref Range Status   Specimen Description BLOOD RIGHT ASSIST CONTROL  Final   Special Requests BOTTLES DRAWN AEROBIC AND ANAEROBIC 1 CC  Final   Culture NO GROWTH 4 DAYS  Final   Report Status PENDING  Incomplete  Culture, blood (Routine X 2) w Reflex to ID Panel     Status: None (Preliminary result)   Collection Time: 12/17/15  3:56 PM  Result Value Ref Range Status   Specimen Description BLOOD LEFT ASSIST CONTROL  Final   Special Requests BOTTLES DRAWN AEROBIC AND ANAEROBIC 1 CC  Final   Culture NO GROWTH 4 DAYS  Final   Report Status PENDING  Incomplete    Management plans discussed with the patient, family and they are in agreement.  CODE STATUS:     Code Status Orders        Start     Ordered   12/17/15 1918  Full code   Continuous     12/17/15 1917    Code Status History    Date Active Date Inactive Code Status Order ID Comments User Context   03/10/2015  8:56 AM 03/10/2015  1:27 PM Full Code [id number]  Gwyneth Revels, DPM Inpatient   01/17/2015 10:28 PM 01/20/2015  2:37 PM Full Code [id number]  Oralia Manis, MD Inpatient      TOTAL TIME TAKING CARE OF THIS PATIENT: 35 minutes.    Alford Highland M.D on 12/21/2015 at 3:35 PM  Between 7am to 6pm - Pager - [phone]  After 6pm go to [email or web address] - password [email or web address]  [facility] Office  [phone]  CC: Primary care physician; No PCP Per Patient

## 2015-12-21 NOTE — Care Management Important Message (Signed)
Important Message  Patient Details  Name: [name] MRN: [id number] Date of Birth: 1955-08-13   Medicare Important Message Given:  Yes    Canaan Holzer A, RN 12/21/2015, 7:00 AM

## 2015-12-21 NOTE — Discharge Instructions (Signed)

## 2015-12-22 LAB — CULTURE, BLOOD (ROUTINE X 2)
CULTURE: NO GROWTH
CULTURE: NO GROWTH

## 2015-12-25 ENCOUNTER — Telehealth: Payer: Self-pay | Admitting: Cardiovascular Disease

## 2015-12-25 NOTE — Telephone Encounter (Signed)
lmov to schedule fu 3 attempts to schedule fu from recall list.  Deleting recall.

## 2016-02-22 ENCOUNTER — Telehealth: Payer: Self-pay | Admitting: Physician Assistant

## 2016-02-22 NOTE — Telephone Encounter (Signed)
Received cardiac clearance from [facility], Dr. Gwyneth Revels. Pt has 7/28 appt w/Ryan Dunn, PA-C. Clearance placed in Ryan's in-box

## 2016-02-28 ENCOUNTER — Encounter: Payer: Self-pay | Admitting: Physician Assistant

## 2016-02-29 ENCOUNTER — Telehealth: Payer: Self-pay | Admitting: Cardiovascular Disease

## 2016-02-29 ENCOUNTER — Encounter
Admission: RE | Admit: 2016-02-29 | Discharge: 2016-02-29 | Disposition: A | Discharge status: Home / Self Care | Payer: Medicare HMO | Source: Ambulatory Visit | Attending: Podiatry | Admitting: Podiatry

## 2016-02-29 DIAGNOSIS — Z01810 Encounter for preprocedural cardiovascular examination: Secondary | ICD-10-CM | POA: Diagnosis not present

## 2016-02-29 DIAGNOSIS — Z01812 Encounter for preprocedural laboratory examination: Secondary | ICD-10-CM | POA: Diagnosis present

## 2016-02-29 LAB — DIFFERENTIAL
Basophils Absolute: 0.1 10*3/uL (ref 0–0.1)
Basophils Relative: 1 %
EOS ABS: 0.4 10*3/uL (ref 0–0.7)
EOS PCT: 7 %
LYMPHS ABS: 1.3 10*3/uL (ref 1.0–3.6)
Lymphocytes Relative: 20 %
Monocytes Absolute: 0.5 10*3/uL (ref 0.2–1.0)
Monocytes Relative: 7 %
NEUTROS PCT: 65 %
Neutro Abs: 4.1 10*3/uL (ref 1.4–6.5)

## 2016-02-29 LAB — BASIC METABOLIC PANEL
Anion gap: 8 (ref 5–15)
BUN: 9 mg/dL (ref 6–20)
CALCIUM: 8.4 mg/dL — AB (ref 8.9–10.3)
CO2: 25 mmol/L (ref 22–32)
CREATININE: 1.43 mg/dL — AB (ref 0.61–1.24)
Chloride: 99 mmol/L — ABNORMAL LOW (ref 101–111)
GFR calc non Af Amer: 53 mL/min — ABNORMAL LOW (ref >60)
GLUCOSE: 397 mg/dL — AB (ref 65–99)
Potassium: 4.1 mmol/L (ref 3.5–5.1)
Sodium: 132 mmol/L — ABNORMAL LOW (ref 135–145)

## 2016-02-29 LAB — CBC
HCT: 42 % (ref 40.0–52.0)
HEMOGLOBIN: 13.5 g/dL (ref 13.0–18.0)
MCH: 23.4 pg — AB (ref 26.0–34.0)
MCHC: 32.1 g/dL (ref 32.0–36.0)
MCV: 72.8 fL — ABNORMAL LOW (ref 80.0–100.0)
Platelets: 136 10*3/uL — ABNORMAL LOW (ref 150–440)
RBC: 5.77 MIL/uL (ref 4.40–5.90)
RDW: 20.2 % — ABNORMAL HIGH (ref 11.5–14.5)
WBC: 6.3 10*3/uL (ref 3.8–10.6)

## 2016-02-29 NOTE — Pre-Procedure Instructions (Signed)
Clearance request sent to Dr. Concha Se due to EKG changes.  To be seen this week.

## 2016-02-29 NOTE — Telephone Encounter (Signed)
Received cardiac clearance request for pt to proceed w/ achilles tendon lengthening and ulcer debridement of left foot and leg on 03/08/16. Dr. Orson Ape would like instructions on holding pt's Plavix. Please fax clearance back to [facility] [phone].

## 2016-02-29 NOTE — Patient Instructions (Signed)
  Your procedure is scheduled on:Fri. 03/08/16 Report to Day Surgery. To find out your arrival time please call [phone] between 1PM - 3PM on Thurs. 03/07/16.  Remember: Instructions that are not followed completely may result in serious medical risk, up to and including death, or upon the discretion of your surgeon and anesthesiologist your surgery may need to be rescheduled.    ___x 1. Do not eat food or drink liquids after midnight. No gum chewing or hard candies.     ___x_ 2. No Alcohol for 24 hours before or after surgery.   ____x 3. Do Not Smoke For 24 Hours Prior to Your Surgery.   ____ 4. Bring all medications with you on the day of surgery if instructed.    ___x_ 5. Notify your doctor if there is any change in your medical condition     (cold, fever, infections).       Do not wear jewelry, make-up, hairpins, clips or nail polish.  Do not wear lotions, powders, or perfumes. You may wear deodorant.  Do not shave 48 hours prior to surgery. Men may shave face and neck.  Do not bring valuables to the hospital.    [facility] is not responsible for any belongings or valuables.               Contacts, dentures or bridgework may not be worn into surgery.  Leave your suitcase in the car. After surgery it may be brought to your room.  For patients admitted to the hospital, discharge time is determined by your                treatment team.   Patients discharged the day of surgery will not be allowed to drive home.   Please read over the following fact sheets that you were given:   Surgical Site Infection Prevention   _x___ Take these medicines the morning of surgery with A SIP OF WATER:    1. Hydrocodone-acet.  5/325  2. isosorbide  3. lisinopril  4. metoprolol  5. lyrica  6.  ____ Fleet Enema (as directed)   _x___ Use CHG Soap as directed  __x__ Use inhalers on the day of surgery  ____ Stop metformin 2 days prior to surgery    ____ Take 1/2 of usual insulin dose  the night before surgery and none on the morning of surgery.   __x__ Stop Plavix per cardiologist recommendation  ____ Stop Anti-inflammatories on    ____ Stop supplements until after surgery.    ____ Bring C-Pap to the hospital.

## 2016-03-01 ENCOUNTER — Ambulatory Visit (INDEPENDENT_AMBULATORY_CARE_PROVIDER_SITE_OTHER): Payer: Medicare HMO | Admitting: Physician Assistant

## 2016-03-01 ENCOUNTER — Encounter: Payer: Self-pay | Admitting: Physician Assistant

## 2016-03-01 VITALS — BP 100/70 | HR 73 | Resp 98 | Ht 77.0 in | Wt 349.2 lb

## 2016-03-01 DIAGNOSIS — I2510 Atherosclerotic heart disease of native coronary artery without angina pectoris: Secondary | ICD-10-CM | POA: Diagnosis not present

## 2016-03-01 DIAGNOSIS — Z720 Tobacco use: Secondary | ICD-10-CM | POA: Diagnosis not present

## 2016-03-01 DIAGNOSIS — I5022 Chronic systolic (congestive) heart failure: Secondary | ICD-10-CM | POA: Diagnosis not present

## 2016-03-01 DIAGNOSIS — Z01810 Encounter for preprocedural cardiovascular examination: Secondary | ICD-10-CM

## 2016-03-01 DIAGNOSIS — N183 Chronic kidney disease, stage 3 unspecified: Secondary | ICD-10-CM

## 2016-03-01 DIAGNOSIS — E1159 Type 2 diabetes mellitus with other circulatory complications: Secondary | ICD-10-CM

## 2016-03-01 NOTE — Patient Instructions (Addendum)
Medication Instructions:  Your physician recommends that you continue on your current medications as directed. Please refer to the Current Medication list given to you today.   Labwork: none  Testing/Procedures: Your physician has requested that you have a lexiscan myoview. For further information please visit [email or web address] Please follow instruction sheet, as given.  [facility] MYOVIEW  Your caregiver has ordered a Stress Test with nuclear imaging. The purpose of this test is to evaluate the blood supply to your heart muscle. This procedure is referred to as a "Non-Invasive Stress Test." This is because other than having an IV started in your vein, nothing is inserted or "invades" your body. Cardiac stress tests are done to find areas of poor blood flow to the heart by determining the extent of coronary artery disease (CAD). Some patients exercise on a treadmill, which naturally increases the blood flow to your heart, while others who are  unable to walk on a treadmill due to physical limitations have a pharmacologic/chemical stress agent called Lexiscan . This medicine will mimic walking on a treadmill by temporarily increasing your coronary blood flow.   Please note: these test may take anywhere between 2-4 hours to complete  PLEASE REPORT TO [facility] MEDICAL MALL ENTRANCE  THE VOLUNTEERS AT THE FIRST DESK WILL DIRECT YOU WHERE TO GO  Date of Procedure: Monday and Tuesday, July 31 and August 1  Arrival Time for Procedure: 8:45am  Instructions regarding medication:   _xx___ : Hold diabetes medication morning of procedure  __xx__:  Hold metoprolol the  night before procedure and morning of procedure    PLEASE NOTIFY THE OFFICE AT LEAST 24 HOURS IN ADVANCE IF YOU ARE UNABLE TO KEEP YOUR APPOINTMENT.  [phone] AND  PLEASE NOTIFY NUCLEAR MEDICINE AT [facility] AT LEAST 24 HOURS IN ADVANCE IF YOU ARE UNABLE TO KEEP YOUR APPOINTMENT. [phone]  How to prepare for your Myoview  test:  1. Do not eat or drink after midnight 2. No caffeine for 24 hours prior to test 3. No smoking 24 hours prior to test. 4. Your medication may be taken with water.  If your doctor stopped a medication because of this test, do not take that medication. 5. Ladies, please do not wear dresses.  Skirts or pants are appropriate. Please wear a short sleeve shirt. 6. No perfume, cologne or lotion. 7. Wear comfortable walking shoes. No heels!         Follow-Up: Your physician recommends that you schedule a follow-up appointment with Dr. Mariah Milling in 3 months.   Any Other Special Instructions Will Be Listed Below (If Applicable). Hold Plavix Monday and resume upon the advice of your surgeon.      If you need a refill on your cardiac medications before your next appointment, please call your pharmacy.  Cardiac Nuclear Scanning A cardiac nuclear scan is used to check your heart for problems, such as the following:  A portion of the heart is not getting enough blood.  Part of the heart muscle has died, which happens with a heart attack.  The heart wall is not working normally.  In this test, a radioactive dye (tracer) is injected into your bloodstream. After the tracer has traveled to your heart, a scanning device is used to measure how much of the tracer is absorbed by or distributed to various areas of your heart. LET [facility] CARE PROVIDER KNOW ABOUT:  Any allergies you have.  All medicines you are taking, including vitamins, herbs, eye drops, creams, and over-the-counter medicines.  Previous problems you or members of your family have had with the use of anesthetics.  Any blood disorders you have.  Previous surgeries you have had.  Medical conditions you have.  RISKS AND COMPLICATIONS Generally, this is a safe procedure. However, as with any procedure, problems can occur. Possible problems include:   Serious chest pain.  Rapid heartbeat.  Sensation of warmth in  your chest. This usually passes quickly. BEFORE THE PROCEDURE Ask your health care provider about changing or stopping your regular medicines. PROCEDURE This procedure is usually done at a hospital and takes 2-4 hours.  An IV tube is inserted into one of your veins.  Your health care provider will inject a small amount of radioactive tracer through the tube.  You will then wait for 20-40 minutes while the tracer travels through your bloodstream.  You will lie down on an exam table so images of your heart can be taken. Images will be taken for about 15-20 minutes.  You will exercise on a treadmill or stationary bike. While you exercise, your heart activity will be monitored with an electrocardiogram (ECG), and your blood pressure will be checked.  If you are unable to exercise, you may be given a medicine to make your heart beat faster.  When blood flow to your heart has peaked, tracer will again be injected through the IV tube.  After 20-40 minutes, you will get back on the exam table and have more images taken of your heart.  When the procedure is over, your IV tube will be removed. AFTER THE PROCEDURE  You will likely be able to leave shortly after the test. Unless your health care provider tells you otherwise, you may return to your normal schedule, including diet, activities, and medicines.  Make sure you find out how and when you will get your test results.   This information is not intended to replace advice given to you by your health care provider. Make sure you discuss any questions you have with your health care provider.   Document Released: 08/16/2004 Document Revised: 07/27/2013 Document Reviewed: 06/30/2013 Elsevier Interactive Patient Education [employer]

## 2016-03-01 NOTE — Progress Notes (Signed)
Cardiology Office Note Date:  03/01/2016  Patient ID:  [name], [name] 03/24/1956, MRN [id number] PCP:  [facility]  Cardiologist:  Dr. Mariah Milling, MD    Chief Complaint: Cardiac pre-operative evaluation   History of Present Illness: [name] is a 60 y.o. male with history of nonobstructive CAD by cardiac cath in 2015, chronic systolic CHF/ICM, COPD with long history of tobacco abuse since age 89 with recent cessation of tobacco abuse, poorly controlled DM2, HTN, HLD, LBBB, CKD stage III, obesity, and secondary erythrocytosis, and history of ischemic toe who presents for cardiac clearance for surgery.   Cardiac cath in 2015 showed distal LAD and severe distal RCA disease with medical maangement advised. EF at that time was 35-45% by echo in 11/2013. He presented at that time with an ischemic toe and echo was performed to evaluate for cardioemblic source with no source being found; however, EF was noted to be down. It was felt the cause of ischemia was either 2/2 embolus from a proximal vessel of his chronic erythrocytosis. In follow up with Dr. Mariah Milling, MD given his depressed EF and LBBB a Myoview was performed that revealed a fixed apical defect with distal anteroseptal ischemia. This was felt to be a low risk study overall and in the setting of mild renal insufficiency medical therapy was recommended. He was seen in follow up in 12/2013 with continuedexertional chest pain and dyspnea. He underwent diagnostic cardiac cath on 12/30/13 that showed numerous vessels that could be contributing to his anginal symptoms including subtotally occluded mid PLV, distal LAD, and proximal ramus. Each of these vessels was noted to be small and/or very distal and medical management was advised. EF was 40-45% at that time by LV gram. Full cath details per PMH.   He lives a very sedentary lifestyle. Since he has stopped smoking he has no longer needed therapeutic phlebotomy for his  erythrocytosis. He was doing well at his last office visit with Dr. Mariah Milling on 09/13/14. He has been admitted to the hospital in mid June 2016 for LE cellulitis and foot abscess s/p I&D and wound vac. He was admitted again to the hospital in May 2017 for cellulitis of the shin and knee and was treated with IV antibiotics.   He has been scheduled for an achilles lengthening surgery on 03/08/2016.   He comes in stating he is doing well. Upon questioning him he does report intermittent left-sided chest pain occurring at rest, as he lives an almost exclusively sedentary lifestyle, as well as intermittent SOB that improves with use of his inhaler. He last had chest pain one week ago while sitting on the porch in the heat/humidity. No chest symptoms. He has been taking his medications as directed without issues.   Past Medical History:  Diagnosis Date  . Chronic systolic CHF (congestive heart failure) ([name])   . CKD (chronic kidney disease), stage III    a. Creat 1.42 (10/2013).  . Collagen vascular disease ([name])   . COPD (chronic obstructive pulmonary disease) ([name])   . Coronary artery disease, non-occlusive    a. cath 12/2013: mLAD 50%, dLAD 90%, pLCx 30%, mLCx 40%, pRI 70%, pRCA 40%, dRCA 50%, RPLS 99%, EF 40-45%, mild global HK  . Hyperlipidemia   . Hypertension   . Ischemic cardiomyopathy    a. 10/2013 Echo: EF 35-40%, mild MR/TR;  b. 11/2013 Lexi MV: EF 46%, fixed apical defect with small distal antsept ischemia->overall low-risk->med Rx.  . Ischemic toe  a. 10/2013 L fifth toe - felt to be either 2/2 embolic plaque vs erythrocytosis - seen by vascular surgery ([facility]) ->conservative rx.  Marland Kitchen LBBB (left bundle branch block)   . Peripheral neuropathy ([name])   . Secondary erythrocytosis    a. followed by heme-onc with periodic phlebotomy.  . Tobacco abuse    a. 46 yrs, up to 2 ppd, changed to e-cigarette 05/2013.  . Type II diabetes mellitus ([name])     Past Surgical History:  Procedure Laterality Date    . I&D EXTREMITY Left 01/18/2015   Procedure: IRRIGATION AND DEBRIDEMENT EXTREMITY;  Surgeon: Gwyneth Revels, DPM;  Location: [facility] ORS;  Service: Podiatry;  Laterality: Left;  . ORIF TIBIA FRACTURE Left 2008  . WOUND EXPLORATION Left 03/10/2015   Procedure: WOUND EXPLORATION/ SECONDARY CLOSURE OF WOUND,COMPLICATED;  Surgeon: Gwyneth Revels, DPM;  Location: [facility] ORS;  Service: Podiatry;  Laterality: Left;    Current Outpatient Prescriptions  Medication Sig Dispense Refill  . albuterol (PROVENTIL HFA;VENTOLIN HFA) 108 (90 BASE) MCG/ACT inhaler Inhale 2 puffs into the lungs every 4 (four) hours as needed for wheezing or shortness of breath.    . canagliflozin (INVOKANA) 100 MG TABS tablet Take 100 mg by mouth daily before breakfast.    . clopidogrel (PLAVIX) 75 MG tablet Take 75 mg by mouth daily with breakfast.    . glipiZIDE (GLUCOTROL XL) 10 MG 24 hr tablet Take 10 mg by mouth daily.  3  . HYDROcodone-acetaminophen (NORCO) 5-325 MG tablet Take 1 tablet by mouth every 6 (six) hours as needed for moderate pain. 15 tablet 0  . isosorbide mononitrate (IMDUR) 30 MG 24 hr tablet Take 0.5 tablets (15 mg total) by mouth daily. 90 tablet 3  . linagliptin (TRADJENTA) 5 MG TABS tablet Take 5 mg by mouth every morning.     Marland Kitchen lisinopril (PRINIVIL,ZESTRIL) 20 MG tablet Take 10 mg by mouth every morning.     . loratadine (CLARITIN) 10 MG tablet Take 10 mg by mouth daily as needed for allergies (seasonal allergies in the spring).     Marland Kitchen lovastatin (MEVACOR) 40 MG tablet Take 40 mg by mouth at bedtime.    . metoprolol tartrate (LOPRESSOR) 25 MG tablet Take 1 tablet (25 mg total) by mouth 2 (two) times daily. 180 tablet 3  . nitroGLYCERIN (NITROSTAT) 0.4 MG SL tablet Place 1 tablet (0.4 mg total) under the tongue every 5 (five) minutes as needed for chest pain. 25 tablet 3  . pregabalin (LYRICA) 100 MG capsule Take 100 mg by mouth 3 (three) times daily.    . sertraline (ZOLOFT) 100 MG tablet Take 100 mg by mouth at  bedtime.    . traZODone (DESYREL) 150 MG tablet TAKE 1 TABLET BY MOUTH AT BEDTIME. 30 tablet 0   No current facility-administered medications for this visit.     Allergies:   Review of patient's allergies indicates no known allergies.   Social History:  The patient  reports that he has been smoking Cigarettes and E-cigarettes.  He has a 11.50 pack-year smoking history. He has never used smokeless tobacco. He reports that he drinks alcohol. He reports that he does not use drugs.   Family History:  The patient's family history includes Diabetes in his brother; Hyperlipidemia in his brother, brother, brother, and mother; Hypertension in his brother, brother, brother, maternal aunt, and mother.  ROS:   Review of Systems  Constitutional: Positive for malaise/fatigue. Negative for chills, diaphoresis, fever and weight loss.  HENT: Negative for  congestion.   Eyes: Negative for discharge and redness.  Respiratory: Positive for shortness of breath. Negative for cough, sputum production and wheezing.   Cardiovascular: Positive for chest pain and leg swelling. Negative for palpitations, orthopnea, claudication and PND.  Gastrointestinal: Negative for abdominal pain, heartburn, nausea and vomiting.  Musculoskeletal: Positive for joint pain. Negative for falls and myalgias.  Skin: Negative for rash.  Neurological: Positive for weakness. Negative for dizziness, tingling, tremors, sensory change, speech change, focal weakness and loss of consciousness.  Endo/Heme/Allergies: Does not bruise/bleed easily.  Psychiatric/Behavioral: Negative for substance abuse. The patient is not nervous/anxious.      PHYSICAL EXAM:  VS:  BP 100/70 (BP Location: Left Arm, Patient Position: Sitting, Cuff Size: Normal)   Pulse 73   Resp (!) 98   Ht  (1.956 m)   Wt (!) 349 lb 4 oz (158.4 kg)   BMI 41.42 kg/m  BMI: Body mass index is 41.42 kg/m.  Physical Exam  Constitutional: He is oriented to person, place, and  time. He appears well-developed and well-nourished.  HENT:  Head: Normocephalic and atraumatic.  Eyes: Right eye exhibits no discharge. Left eye exhibits no discharge.  Neck: Normal range of motion. No JVD present.  Cardiovascular: Normal rate, regular rhythm, S1 normal, S2 normal and normal heart sounds.  Exam reveals no distant heart sounds, no friction rub, no midsystolic click and no opening snap.   No murmur heard. Pulmonary/Chest: Effort normal and breath sounds normal. No respiratory distress. He has no decreased breath sounds. He has no wheezes. He has no rales. He exhibits no tenderness.  Abdominal: Soft. He exhibits no distension. There is no tenderness.  Obese  Musculoskeletal: He exhibits edema.  Trace pre-tibial edema with chronic venous stasis   Neurological: He is alert and oriented to person, place, and time.  Skin: Skin is warm and dry. No cyanosis. Nails show no clubbing.  Psychiatric: He has a normal mood and affect. His speech is normal and behavior is normal. Judgment and thought content normal.     EKG:  From 02/29/2016 was interpreted by me today. Shows NSR, 74 bpm, LBBB (old)  Recent Labs: 12/18/2015: ALT 16 02/29/2016: BUN 9; Creatinine, Ser 1.43; Hemoglobin 13.5; Platelets 136; Potassium 4.1; Sodium 132  No results found for requested labs within last 8760 hours.   Estimated Creatinine Clearance: 93 mL/min (by C-G formula based on SCr of 1.43 mg/dL).   Wt Readings from Last 3 Encounters:  03/01/16 (!) 349 lb 4 oz (158.4 kg)  02/29/16 (!) 360 lb (163.3 kg)  12/21/15 (!) 352 lb 1.6 oz (159.7 kg)     Other studies reviewed: Additional studies/records reviewed today include: summarized above  ASSESSMENT AND PLAN:  1. Cardiac pre-operative evaluation: He notes intermittent chest pain and SOB at rest. He lives an almost exclusively sedentary lifestyle so assessing for any exertional symptoms is nearly impossible in him. Given his above cardiac cath on 12/30/2013  and his current symptoms I will schedule him for Lexiscan Myoview to evaluate for high-risk ischemia. He is unable to treadmill 2/2 left LE pain and need for surgery. Pre-operative paperwork to be completed after his stress test results are back. At that time paperwork will be faxed into surgeon's office.   2. Nonobstructive CAD: Currently without symptoms concerning for angina. Schedule nuclear stress test as above. He is on Plavix only, perhaps 2/2 his primary ischemic toe as above. No prior MI. He will hold Plavix 5 days prior to surgery. Post  operatively he will restart Plavix per surgeon. Perhaps he could be transitioned to aspirin 81 mg alone rather than Plavix given there is no cardiac indication for Plavix at this time per EBM. Defer to Dr. Mariah Milling. Continue beta blocker and statin as below.    3. Chronic systolic CHF: He does not appear volume overloaded at this time. Continue beta blocker. CHF education.   4. COPD with ongoing tobacco abuse: Stable. Continue current regimen per PCP.   5. HTN: Well controlled. Continue current medications.   6. HLD: Continue statin per PCP. Consider high-intensity statin, defer to primary cardiologist.   7. DM2: Per PCP.   8. CKD stage III: Stable as of 7/27. Per PCP.   9. Obesity: Weight loss advised. Perhaps he would benefit from pulmonary rehab, defer to primary cardiologist.   Disposition: F/u with Dr. Mariah Milling, MD in s/p Myoview/surgery   Current medicines are reviewed at length with the patient today.  The patient did not have any concerns regarding medicines.  Elinor Dodge PA-C 03/01/2016 3:29 PM     [facility] [address] [place], [place] [address] [phone]

## 2016-03-04 ENCOUNTER — Ambulatory Visit
Admission: RE | Admit: 2016-03-04 | Discharge: 2016-03-09 | Disposition: A | Discharge status: Home / Self Care | Payer: Medicare HMO | Source: Ambulatory Visit | Attending: Podiatry | Admitting: Podiatry

## 2016-03-04 ENCOUNTER — Ambulatory Visit: Payer: Medicare HMO

## 2016-03-04 ENCOUNTER — Encounter (HOSPITAL_BASED_OUTPATIENT_CLINIC_OR_DEPARTMENT_OTHER)
Admission: RE | Admit: 2016-03-04 | Discharge: 2016-03-04 | Disposition: A | Discharge status: Home / Self Care | Payer: Medicare HMO | Source: Ambulatory Visit | Attending: Physician Assistant | Admitting: Physician Assistant

## 2016-03-04 DIAGNOSIS — Z79899 Other long term (current) drug therapy: Secondary | ICD-10-CM | POA: Diagnosis not present

## 2016-03-04 DIAGNOSIS — G8929 Other chronic pain: Secondary | ICD-10-CM | POA: Insufficient documentation

## 2016-03-04 DIAGNOSIS — I423 Endomyocardial (eosinophilic) disease: Secondary | ICD-10-CM | POA: Diagnosis not present

## 2016-03-04 DIAGNOSIS — F17210 Nicotine dependence, cigarettes, uncomplicated: Secondary | ICD-10-CM | POA: Diagnosis not present

## 2016-03-04 DIAGNOSIS — E11621 Type 2 diabetes mellitus with foot ulcer: Secondary | ICD-10-CM | POA: Insufficient documentation

## 2016-03-04 DIAGNOSIS — I447 Left bundle-branch block, unspecified: Secondary | ICD-10-CM | POA: Insufficient documentation

## 2016-03-04 DIAGNOSIS — N183 Chronic kidney disease, stage 3 (moderate): Secondary | ICD-10-CM | POA: Diagnosis not present

## 2016-03-04 DIAGNOSIS — E784 Other hyperlipidemia: Secondary | ICD-10-CM | POA: Diagnosis not present

## 2016-03-04 DIAGNOSIS — I999 Unspecified disorder of circulatory system: Secondary | ICD-10-CM | POA: Diagnosis not present

## 2016-03-04 DIAGNOSIS — I5042 Chronic combined systolic (congestive) and diastolic (congestive) heart failure: Secondary | ICD-10-CM | POA: Diagnosis not present

## 2016-03-04 DIAGNOSIS — Z7982 Long term (current) use of aspirin: Secondary | ICD-10-CM | POA: Diagnosis not present

## 2016-03-04 DIAGNOSIS — Z7902 Long term (current) use of antithrombotics/antiplatelets: Secondary | ICD-10-CM | POA: Insufficient documentation

## 2016-03-04 DIAGNOSIS — G4730 Sleep apnea, unspecified: Secondary | ICD-10-CM | POA: Insufficient documentation

## 2016-03-04 DIAGNOSIS — J449 Chronic obstructive pulmonary disease, unspecified: Secondary | ICD-10-CM | POA: Diagnosis not present

## 2016-03-04 DIAGNOSIS — Z6841 Body Mass Index (BMI) 40.0 and over, adult: Secondary | ICD-10-CM | POA: Diagnosis not present

## 2016-03-04 DIAGNOSIS — Z79891 Long term (current) use of opiate analgesic: Secondary | ICD-10-CM | POA: Insufficient documentation

## 2016-03-04 DIAGNOSIS — Z7984 Long term (current) use of oral hypoglycemic drugs: Secondary | ICD-10-CM | POA: Insufficient documentation

## 2016-03-04 DIAGNOSIS — I2510 Atherosclerotic heart disease of native coronary artery without angina pectoris: Secondary | ICD-10-CM | POA: Insufficient documentation

## 2016-03-04 DIAGNOSIS — M216X2 Other acquired deformities of left foot: Secondary | ICD-10-CM | POA: Diagnosis present

## 2016-03-04 DIAGNOSIS — E1122 Type 2 diabetes mellitus with diabetic chronic kidney disease: Secondary | ICD-10-CM | POA: Diagnosis not present

## 2016-03-04 DIAGNOSIS — I130 Hypertensive heart and chronic kidney disease with heart failure and stage 1 through stage 4 chronic kidney disease, or unspecified chronic kidney disease: Secondary | ICD-10-CM | POA: Insufficient documentation

## 2016-03-04 DIAGNOSIS — Z01810 Encounter for preprocedural cardiovascular examination: Secondary | ICD-10-CM

## 2016-03-04 DIAGNOSIS — E1142 Type 2 diabetes mellitus with diabetic polyneuropathy: Secondary | ICD-10-CM | POA: Insufficient documentation

## 2016-03-04 DIAGNOSIS — L97521 Non-pressure chronic ulcer of other part of left foot limited to breakdown of skin: Secondary | ICD-10-CM | POA: Diagnosis present

## 2016-03-04 DIAGNOSIS — G609 Hereditary and idiopathic neuropathy, unspecified: Secondary | ICD-10-CM | POA: Insufficient documentation

## 2016-03-04 MED ORDER — TECHNETIUM TC 99M TETROFOSMIN IV KIT
28.9600 | PACK | Freq: Once | INTRAVENOUS | Status: AC | PRN
  Administered 2016-03-04: 28.96 via INTRAVENOUS

## 2016-03-04 MED ORDER — REGADENOSON 0.4 MG/5ML IV SOLN
0.4000 mg | Freq: Once | INTRAVENOUS | Status: AC
  Administered 2016-03-04: 0.4 mg via INTRAVENOUS

## 2016-03-04 NOTE — Telephone Encounter (Signed)
Okay to stop Plavix 5 days prior to surgery No further testing needed Acceptable risk

## 2016-03-05 ENCOUNTER — Encounter
Admission: RE | Admit: 2016-03-05 | Discharge: 2016-03-05 | Disposition: A | Discharge status: Home / Self Care | Payer: Medicare HMO | Source: Ambulatory Visit | Attending: Physician Assistant | Admitting: Physician Assistant

## 2016-03-05 DIAGNOSIS — M216X2 Other acquired deformities of left foot: Secondary | ICD-10-CM | POA: Diagnosis not present

## 2016-03-05 MED ORDER — TECHNETIUM TC 99M TETROFOSMIN IV KIT: 34.0000 | PACK | Freq: Once | INTRAVENOUS | Status: DC | PRN

## 2016-03-05 MED ORDER — TECHNETIUM TC 99M TETROFOSMIN IV KIT
31.0180 | PACK | Freq: Once | INTRAVENOUS | Status: AC | PRN
  Administered 2016-03-05: 31.018 via INTRAVENOUS

## 2016-03-05 NOTE — Telephone Encounter (Signed)
Routed to fax # provided. 

## 2016-03-06 LAB — NM MYOCAR MULTI W/SPECT W/WALL MOTION / EF
CHL CUP NUCLEAR SDS: 5
LVDIAVOL: 179 mL (ref 62–150)
LVSYSVOL: 75 mL
NUC STRESS TID: 1.44
Peak HR: 80 {beats}/min
Percent HR: 49 %
Rest HR: 71 {beats}/min
SRS: 7
SSS: 11

## 2016-03-08 ENCOUNTER — Ambulatory Visit: Payer: Medicare HMO | Admitting: Anesthesiology

## 2016-03-08 ENCOUNTER — Encounter: Payer: Self-pay | Admitting: *Deleted

## 2016-03-08 ENCOUNTER — Encounter
Admission: RE | Disposition: A | Discharge status: Home / Self Care | Payer: Self-pay | Source: Ambulatory Visit | Attending: Podiatry

## 2016-03-08 DIAGNOSIS — M216X2 Other acquired deformities of left foot: Secondary | ICD-10-CM | POA: Diagnosis not present

## 2016-03-08 HISTORY — PX: SKIN DEBRIDEMENT: SHX5235

## 2016-03-08 HISTORY — PX: ACHILLES TENDON SURGERY: SHX542

## 2016-03-08 LAB — GLUCOSE, CAPILLARY
Glucose-Capillary: 277 mg/dL — ABNORMAL HIGH (ref 65–99)
Glucose-Capillary: 187 mg/dL — ABNORMAL HIGH (ref 65–99)

## 2016-03-08 SURGERY — KIDNER PROCEDURE, MODIFIED
Anesthesia: General | Laterality: Left

## 2016-03-08 MED ORDER — CEFAZOLIN SODIUM-DEXTROSE 2-4 GM/100ML-% IV SOLN
2.0000 g | Freq: Once | INTRAVENOUS | Status: AC
  Administered 2016-03-08: 2 g via INTRAVENOUS

## 2016-03-08 MED ORDER — LACTATED RINGERS IV SOLN
INTRAVENOUS | Status: DC | PRN
  Administered 2016-03-08: 13:00:00 via INTRAVENOUS

## 2016-03-08 MED ORDER — FENTANYL CITRATE (PF) 100 MCG/2ML IJ SOLN
25.0000 ug | INTRAMUSCULAR | Status: DC | PRN
  Administered 2016-03-08 (×2): 50 ug via INTRAVENOUS

## 2016-03-08 MED ORDER — FAMOTIDINE 20 MG PO TABS
20.0000 mg | ORAL_TABLET | Freq: Once | ORAL | Status: AC
  Administered 2016-03-08: 20 mg via ORAL

## 2016-03-08 MED ORDER — BUPIVACAINE HCL (PF) 0.25 % IJ SOLN
INTRAMUSCULAR | Status: DC | PRN
  Administered 2016-03-08: 7 mL

## 2016-03-08 MED ORDER — FENTANYL CITRATE (PF) 100 MCG/2ML IJ SOLN
INTRAMUSCULAR | Status: DC | PRN
  Administered 2016-03-08: 50 ug via INTRAVENOUS

## 2016-03-08 MED ORDER — BUPIVACAINE HCL (PF) 0.5 % IJ SOLN
INTRAMUSCULAR | Status: AC
  Filled 2016-03-08: qty 30

## 2016-03-08 MED ORDER — CEFAZOLIN SODIUM-DEXTROSE 2-4 GM/100ML-% IV SOLN
INTRAVENOUS | Status: AC
  Administered 2016-03-08: 2 g via INTRAVENOUS
  Filled 2016-03-08: qty 100

## 2016-03-08 MED ORDER — LIDOCAINE HCL (CARDIAC) 20 MG/ML IV SOLN
INTRAVENOUS | Status: DC | PRN
  Administered 2016-03-08: 40 mg via INTRAVENOUS

## 2016-03-08 MED ORDER — LIDOCAINE HCL (PF) 1 % IJ SOLN
INTRAMUSCULAR | Status: AC
  Filled 2016-03-08: qty 30

## 2016-03-08 MED ORDER — FENTANYL CITRATE (PF) 100 MCG/2ML IJ SOLN
INTRAMUSCULAR | Status: AC
  Filled 2016-03-08: qty 2

## 2016-03-08 MED ORDER — MIDAZOLAM HCL 2 MG/2ML IJ SOLN
INTRAMUSCULAR | Status: DC | PRN
  Administered 2016-03-08: 2 mg via INTRAVENOUS

## 2016-03-08 MED ORDER — FAMOTIDINE 20 MG PO TABS
ORAL_TABLET | ORAL | Status: AC
  Administered 2016-03-08: 20 mg via ORAL
  Filled 2016-03-08: qty 1

## 2016-03-08 MED ORDER — EPHEDRINE SULFATE 50 MG/ML IJ SOLN
INTRAMUSCULAR | Status: DC | PRN
  Administered 2016-03-08: 10 mg via INTRAVENOUS

## 2016-03-08 MED ORDER — ROCURONIUM BROMIDE 100 MG/10ML IV SOLN
INTRAVENOUS | Status: DC | PRN
  Administered 2016-03-08: 20 mg via INTRAVENOUS

## 2016-03-08 MED ORDER — PROPOFOL 10 MG/ML IV BOLUS
INTRAVENOUS | Status: DC | PRN
  Administered 2016-03-08: 100 mg via INTRAVENOUS

## 2016-03-08 MED ORDER — ONDANSETRON HCL 4 MG/2ML IJ SOLN: 4.0000 mg | Freq: Once | INTRAMUSCULAR | Status: DC | PRN

## 2016-03-08 MED ORDER — BUPIVACAINE HCL (PF) 0.25 % IJ SOLN
INTRAMUSCULAR | Status: AC
  Filled 2016-03-08: qty 30

## 2016-03-08 MED ORDER — HYDROCODONE-ACETAMINOPHEN 5-325 MG PO TABS: 1.0000 | ORAL_TABLET | Freq: Four times a day (QID) | ORAL | 0 refills | Status: DC | PRN

## 2016-03-08 MED ORDER — SUGAMMADEX SODIUM 500 MG/5ML IV SOLN
INTRAVENOUS | Status: DC | PRN
  Administered 2016-03-08: 317.6 mg via INTRAVENOUS

## 2016-03-08 MED ORDER — PHENYLEPHRINE HCL 10 MG/ML IJ SOLN
INTRAMUSCULAR | Status: DC | PRN
  Administered 2016-03-08 (×2): 100 ug via INTRAVENOUS

## 2016-03-08 MED ORDER — SODIUM CHLORIDE 0.9 % IV SOLN
INTRAVENOUS | Status: DC
  Administered 2016-03-08: 11:00:00 via INTRAVENOUS

## 2016-03-08 SURGICAL SUPPLY — 52 items
BANDAGE ELASTIC 4 LF NS (GAUZE/BANDAGES/DRESSINGS) ×6 IMPLANT
BANDAGE STRETCH 3X4.1 STRL (GAUZE/BANDAGES/DRESSINGS) ×3 IMPLANT
BLADE SURG 15 STRL LF DISP TIS (BLADE) ×4 IMPLANT
BLADE SURG 15 STRL SS (BLADE) ×2
BLADE SURG MINI STRL (BLADE) ×3 IMPLANT
BNDG ESMARK 4X12 TAN STRL LF (GAUZE/BANDAGES/DRESSINGS) ×3 IMPLANT
BNDG ESMARK 6X12 TAN STRL LF (GAUZE/BANDAGES/DRESSINGS) ×3 IMPLANT
CANISTER SUCT 1200ML W/VALVE (MISCELLANEOUS) ×3 IMPLANT
DRAPE FLUOR MINI C-ARM 54X84 (DRAPES) ×3 IMPLANT
DURAPREP 26ML APPLICATOR (WOUND CARE) ×3 IMPLANT
ELECT REM PT RETURN 9FT ADLT (ELECTROSURGICAL) ×3
ELECTRODE REM PT RTRN 9FT ADLT (ELECTROSURGICAL) ×2 IMPLANT
GAUZE PETRO XEROFOAM 1X8 (MISCELLANEOUS) ×3 IMPLANT
GAUZE SPONGE 4X4 12PLY STRL (GAUZE/BANDAGES/DRESSINGS) ×3 IMPLANT
GAUZE STRETCH 2X75IN STRL (MISCELLANEOUS) ×3 IMPLANT
GLOVE BIO SURGEON STRL SZ7.5 (GLOVE) ×3 IMPLANT
GLOVE INDICATOR 8.0 STRL GRN (GLOVE) ×3 IMPLANT
GOWN STRL REUS W/ TWL LRG LVL3 (GOWN DISPOSABLE) ×4 IMPLANT
GOWN STRL REUS W/TWL LRG LVL3 (GOWN DISPOSABLE) ×2
HANDLE YANKAUER SUCT BULB TIP (MISCELLANEOUS) ×3 IMPLANT
KIT RM TURNOVER STRD PROC AR (KITS) ×3 IMPLANT
LABEL OR SOLS (LABEL) ×3 IMPLANT
NDL MAYO CATGUT SZ5 (NEEDLE) ×1
NDL SUT 5 .5 CRC TPR PNT MAYO (NEEDLE) ×2 IMPLANT
NEEDLE FILTER BLUNT 18X 1/2SAF (NEEDLE) ×1
NEEDLE FILTER BLUNT 18X1 1/2 (NEEDLE) ×2 IMPLANT
NEEDLE HYPO 25X1 1.5 SAFETY (NEEDLE) ×9 IMPLANT
NS IRRIG 500ML POUR BTL (IV SOLUTION) ×3 IMPLANT
PACK EXTREMITY ARMC (MISCELLANEOUS) ×3 IMPLANT
PAD CAST CTTN 4X4 STRL (SOFTGOODS) ×2 IMPLANT
PADDING CAST COTTON 4X4 STRL (SOFTGOODS) ×1
RASP SM TEAR CROSS CUT (RASP) ×3 IMPLANT
SPLINT CAST 1 STEP 5X30 WHT (MISCELLANEOUS) ×3 IMPLANT
SPLINT FAST PLASTER 5X30 (CAST SUPPLIES) ×1
SPLINT PLASTER CAST FAST 5X30 (CAST SUPPLIES) ×2 IMPLANT
SPONGE LAP 18X18 5 PK (GAUZE/BANDAGES/DRESSINGS) ×3 IMPLANT
STOCKINETTE M/LG 89821 (MISCELLANEOUS) ×3 IMPLANT
STRIP CLOSURE SKIN 1/2X4 (GAUZE/BANDAGES/DRESSINGS) ×3 IMPLANT
SUT MNCRL+ 5-0 VIOLET P-3 (SUTURE) ×2 IMPLANT
SUT MONOCRYL 5-0 (SUTURE) ×1
SUT PDS AB 0 CT1 27 (SUTURE) IMPLANT
SUT VIC AB 0 SH 27 (SUTURE) ×3 IMPLANT
SUT VIC AB 2-0 SH 27 (SUTURE) ×2
SUT VIC AB 2-0 SH 27XBRD (SUTURE) ×4 IMPLANT
SUT VIC AB 3-0 SH 27 (SUTURE) ×1
SUT VIC AB 3-0 SH 27X BRD (SUTURE) ×2 IMPLANT
SUT VIC AB 4-0 FS2 27 (SUTURE) ×3 IMPLANT
SUT VICRYL AB 3-0 FS1 BRD 27IN (SUTURE) ×3 IMPLANT
SWABSTK COMLB BENZOIN TINCTURE (MISCELLANEOUS) ×3 IMPLANT
SYR 3ML LL SCALE MARK (SYRINGE) ×3 IMPLANT
SYRINGE 10CC LL (SYRINGE) ×6 IMPLANT
WIRE MAGNUM (SUTURE) ×3 IMPLANT

## 2016-03-08 NOTE — Anesthesia Postprocedure Evaluation (Signed)
Anesthesia Post Note  Patient: [name]  Procedure(s) Performed: Procedure(s) (LRB): ACHILLES LENGTHENING/KIDNER (Left) DEBRIDEMENT SKIN FULL THICKNESS  Patient location during evaluation: PACU Anesthesia Type: General Level of consciousness: awake and alert Pain management: pain level controlled Vital Signs Assessment: post-procedure vital signs reviewed and stable Respiratory status: spontaneous breathing, nonlabored ventilation and respiratory function stable Cardiovascular status: blood pressure returned to baseline and stable Postop Assessment: no signs of nausea or vomiting Anesthetic complications: no    Last Vitals:  Vitals:   03/08/16 1437 03/08/16 1452  BP: (!) 124/91 136/80  Pulse: 92 86  Resp: 18 14  Temp:      Last Pain:  Vitals:   03/08/16 1452  TempSrc:   PainSc: 6                  Solange Emry

## 2016-03-08 NOTE — Discharge Instructions (Signed)
[facility] °[facility] ° °POST OPERATIVE INSTRUCTIONS FOR DR. TROXLER AND DR. FOWLER °[facility] ° ° °1. Take your medication as prescribed.  Pain medication should be taken only as needed. ° °2. Keep the dressing clean, dry and intact. ° °3. Keep your foot elevated above the heart level for the first 48 hours. ° ° ° °AMBULATORY SURGERY  °DISCHARGE INSTRUCTIONS ° ° °1) The drugs that you were given will stay in your system until tomorrow so for the next 24 hours you should not: ° °A) Drive an automobile °B) Make any legal decisions °C) Drink any alcoholic beverage ° ° °2) You may resume regular meals tomorrow.  Today it is better to start with liquids and gradually work up to solid foods. ° °You may eat anything you prefer, but it is better to start with liquids, then soup and crackers, and gradually work up to solid foods. ° ° °3) Please notify your doctor immediately if you have any unusual bleeding, trouble breathing, redness and pain at the surgery site, drainage, fever, or pain not relieved by medication. ° ° ° °4) Additional Instructions: ° ° ° ° ° ° ° °Please contact your physician with any problems or Same Day Surgery at [phone], Monday through Friday 6 am to 4 pm, or [facility] number at [phone]. ° °4. Walking to the bathroom and brief periods of walking are acceptable, unless we have instructed you to be non-weight bearing. ° °5. Always wear your post-op shoe when walking.  Always use your crutches if you are to be non-weight bearing. ° °6. Do not take a shower. Baths are permissible as long as the foot is kept out of the water.  ° °7. Every hour you are awake:  °- Bend your knee 15 times. °- Massage calf 15 times ° °8. Call [facility] ([phone]) if any of the following problems occur: °- You develop a temperature or fever. °- The bandage becomes saturated with blood. °- Medication does not stop your  pain. °- Injury of the foot occurs. °- Any symptoms of infection including redness, odor, or red streaks running from wound. ° °

## 2016-03-08 NOTE — Transfer of Care (Signed)
Immediate Anesthesia Transfer of Care Note  Patient: [name]  Procedure(s) Performed: Procedure(s): ACHILLES LENGTHENING/KIDNER (Left) DEBRIDEMENT SKIN FULL THICKNESS  Patient Location: PACU  Anesthesia Type:General  Level of Consciousness: oriented and sedated  Airway & Oxygen Therapy: Patient Spontanous Breathing and Patient connected to face mask oxygen  Post-op Assessment: Report given to RN and Post -op Vital signs reviewed and stable  Post vital signs: Reviewed and stable  Last Vitals:  Vitals:   03/08/16 1104  BP: 108/68  Pulse: 87  Resp: 18  Temp: 36.8 C    Last Pain:  Vitals:   03/08/16 1104  TempSrc: Tympanic  PainSc: 6          Complications: No apparent anesthesia complications

## 2016-03-08 NOTE — Anesthesia Procedure Notes (Signed)
Procedure Name: Intubation Performed by: Alver Fisher Pre-anesthesia Checklist: Patient identified, Emergency Drugs available, Suction available, Patient being monitored and Timeout performed Patient Re-evaluated:Patient Re-evaluated prior to inductionOxygen Delivery Method: Circle system utilized Preoxygenation: Pre-oxygenation with 100% oxygen Intubation Type: IV induction Ventilation: Oral airway inserted - appropriate to patient size Laryngoscope Size: McGraph and 4 Grade View: Grade I Tube type: Oral Tube size: 7.5 mm Number of attempts: 2 Airway Equipment and Method: Stylet Placement Confirmation: ETT inserted through vocal cords under direct vision,  positive ETCO2 and breath sounds checked- equal and bilateral Secured at: 21 cm Tube secured with: Tape Dental Injury: Teeth and Oropharynx as per pre-operative assessment

## 2016-03-08 NOTE — Op Note (Signed)
Operative note   Surgeon:Jatziri Goffredo Armed forces logistics/support/administrative officer: None    Preop diagnosis: 1. Equinus left lower leg 2. Ulcer left plantar first MTPJ     Postop diagnosis:Same   Procedure:1. Excisional debridement left plantar first MTPJ ulcer to subcutaneous tissue 2. Percutaneous tendo Achilles lengthening left lower leg   LYH:[id number]   Anesthesia:local and general    Hemostasis:None   Specimen:None   Complications:None   Operative indications:Billie L Miner is an 60 y.o. that presents today for surgical intervention.  The risks/benefits/alternatives/complications have been discussed and consent has been given.    Procedure:  Patient was brought into the OR and placed on the operating table in thesupine position. After anesthesia was obtained theleft lower extremity was prepped and draped in usual sterile fashion.  Attention was directed to the posterior aspect of the left lower leg where 3 percutaneous incisions were made at 13 and 5 cm from the Achilles insertional site. 2 lateral and one medial hemisections of the Achilles tendon were performed. The foot was dorsiflexed and excellent excursion was noted. The Achilles was found to be intact at this time. Was flushed with copious amounts for irrigation and closure was performed with a 4-0 nylon.  Attention was then directed to the plantar left first MTPJ where a excisional debridement was performed. A 1 cm plantar left first MTPJ ulcer was excisionally debrided down to subcutaneous use tissue with a 15 blade. This was taken down to good healthy bleeding tissue. No exposure of bone was noted.  Large bulky sterile dressings were 4 applied to all areas.   Patient tolerated the procedure and anesthesia well.  Was transported from the OR to the PACU with all vital signs stable and vascular status intact. To be discharged per routine protocol.  Will follow up in approximately 1 week in the outpatient clinic.

## 2016-03-08 NOTE — Anesthesia Preprocedure Evaluation (Signed)
Anesthesia Evaluation  Patient identified by MRN, date of birth, ID band Patient awake    Reviewed: Allergy & Precautions  History of Anesthesia Complications Negative for: history of anesthetic complications  Airway Mallampati: IV  TM Distance: >3 FB Neck ROM: Limited    Dental  (+) Edentulous Upper, Poor Dentition   Pulmonary shortness of breath and with exertion, sleep apnea , COPD,  COPD inhaler, neg recent URI, Current Smoker,           Cardiovascular Exercise Tolerance: Poor hypertension, Pt. on medications (-) angina+ CAD and +CHF  (-) Past MI, (-) Cardiac Stents and (-) CABG + dysrhythmias (LBBB) (-) Valvular Problems/Murmurs Rhythm:Regular     Neuro/Psych negative neurological ROS  negative psych ROS   GI/Hepatic Neg liver ROS, GERD  ,  Endo/Other  diabetes, Poorly Controlled, Type obesity  Renal/GU Renal InsufficiencyRenal disease     Musculoskeletal   Abdominal (+) + obese,   Peds  Hematology negative hematology ROS (+)   Anesthesia Other Findings   Reproductive/Obstetrics negative OB ROS                             Anesthesia Physical  Anesthesia Plan  ASA: IV  Anesthesia Plan: General   Post-op Pain Management:    Induction: Intravenous  Airway Management Planned: LMA  Additional Equipment:   Intra-op Plan:   Post-operative Plan: Extubation in OR  Informed Consent: I have reviewed the patients History and Physical, chart, labs and discussed the procedure including the risks, benefits and alternatives for the proposed anesthesia with the patient or authorized representative who has indicated his/her understanding and acceptance.     Plan Discussed with: CRNA, Anesthesiologist and Surgeon  Anesthesia Plan Comments:         Anesthesia Quick Evaluation

## 2016-03-08 NOTE — H&P (Signed)
  HISTORY AND PHYSICAL INTERVAL NOTE:  03/08/2016  1:16 PM  [name]  has presented today for surgery, with the diagnosis of ULCER OF FOOT.  The various methods of treatment have been discussed with the patient.  No guarantees were given.  After consideration of risks, benefits and other options for treatment, the patient has consented to surgery.  I have reviewed the patients' chart and labs.    Patient Vitals for the past 24 hrs:  BP Temp Temp src Pulse Resp SpO2 Height Weight  03/08/16 1104 108/68 98.3 F (36.8 C) Tympanic 87 18 94 % 6' (1.829 m) (!) 158.8 kg (350 lb)    A history and physical examination was performed in my office.  The patient was reexamined.  There have been no changes to this history and physical examination.  Gwyneth Revels A

## 2016-04-02 IMAGING — CT CT TIBIA FIBULA *L* W/ CM
3 of 5 series · 10 of 33 positions shown, 11 images · IV contrast (agent unspecified)
Comparison: Left foot radiographs performed 10/06/2011, Doppler
venous ultrasound of the left lower extremity performed 12/26/2008,
and left knee CT performed 03/10/2007

CONTRAST:  80mL OMNIPAQUE IOHEXOL 350 MG/ML SOLN

CLINICAL DATA: Stepped on staple few days ago, with swelling and
erythema at the left lower leg. Initial encounter.

EXAM:
CT OF THE LOWER LEFT EXTREMITY WITH CONTRAST
TECHNIQUE: Multidetector CT imaging of the left knee, leg and foot was
performed according to the standard protocol following intravenous
contrast administration.

[Series 4: lt tib fib · axial · 0.64mm/px · z∈[-394,-156]mm · 2 of 398 slices shown, 3 images]
[im 159/398  soft-tissue]
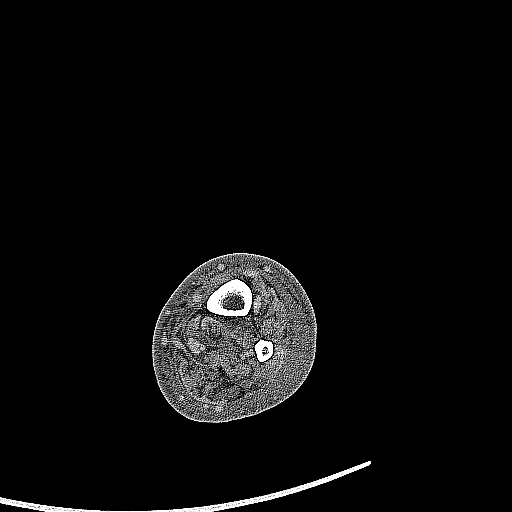
[im 159/398  bone]
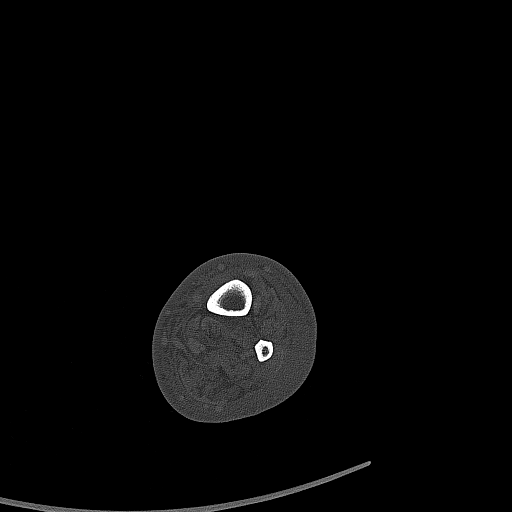
[im 318/398  bone]
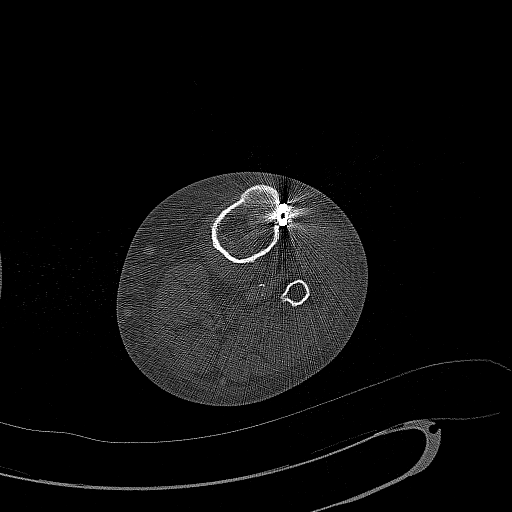

[Series 12: lt tib fib st cor · coronal · 0.57mm/px · 3 of 157 slices shown]
[im 35/157  bone]
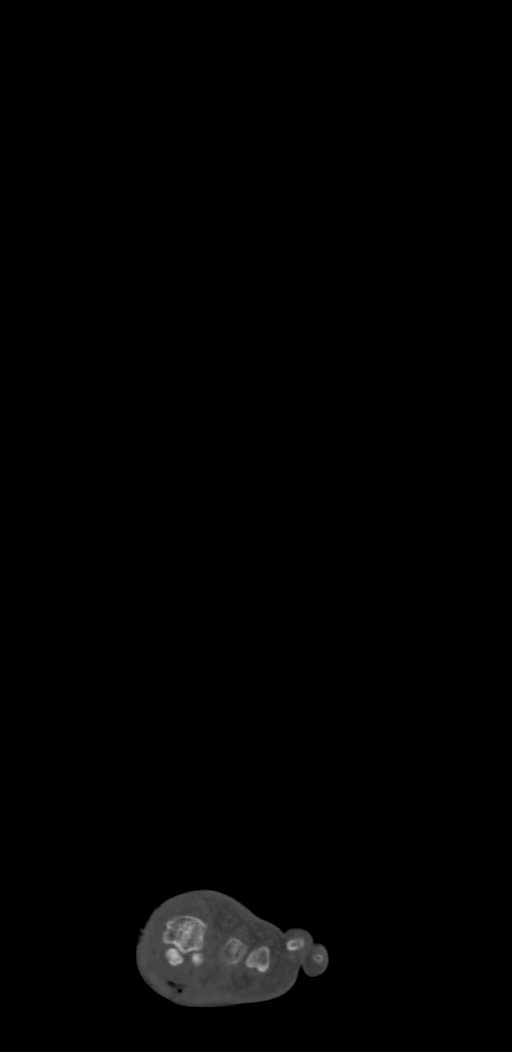
[im 64/157  bone]
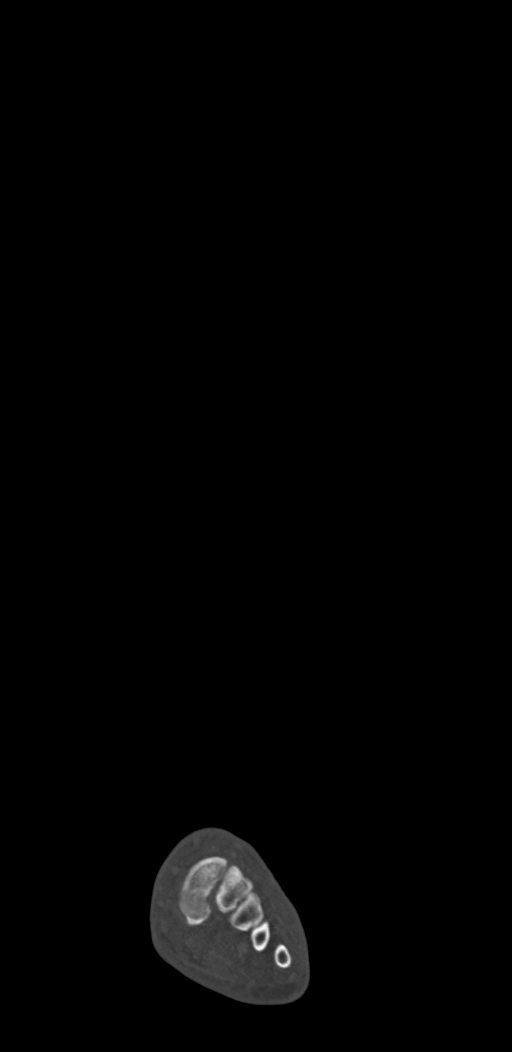
[im 93/157  bone]
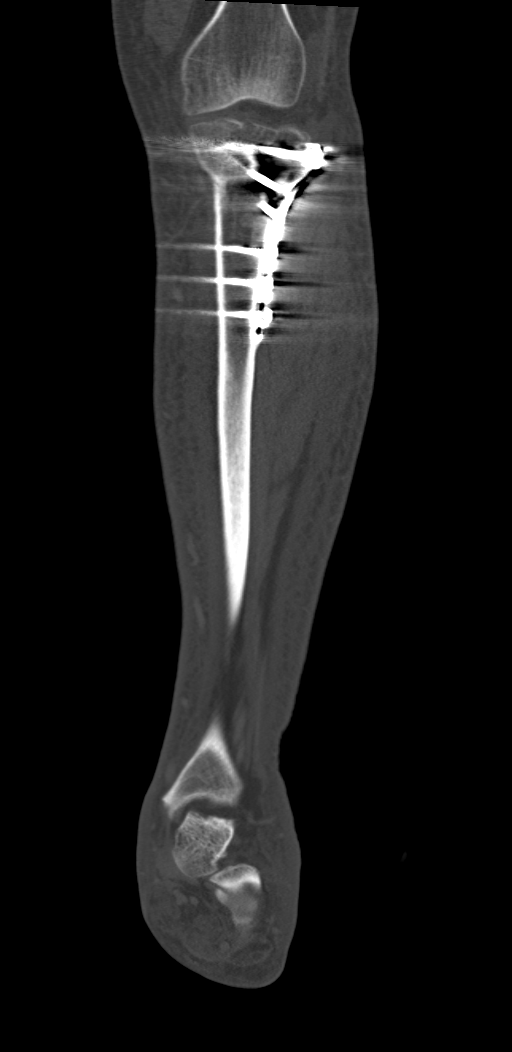

[Series 13: lt tib fib sag st · sagittal · 0.63mm/px · 5 of 88 slices shown]
[im 30/88  bone]
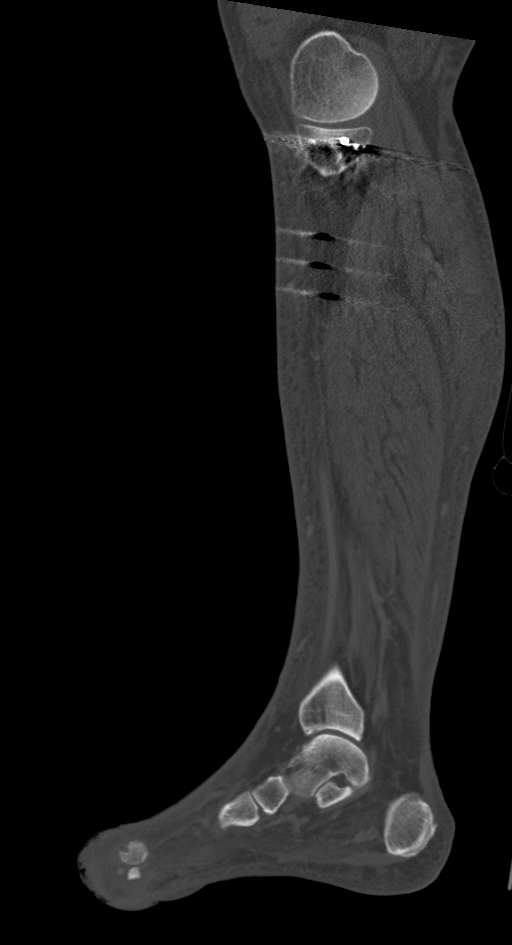
[im 37/88  bone]
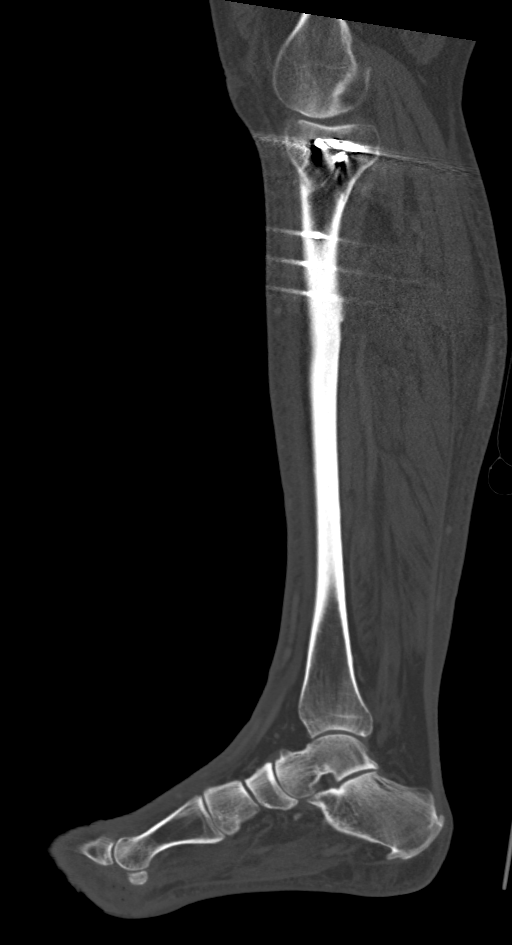
[im 44/88  bone]
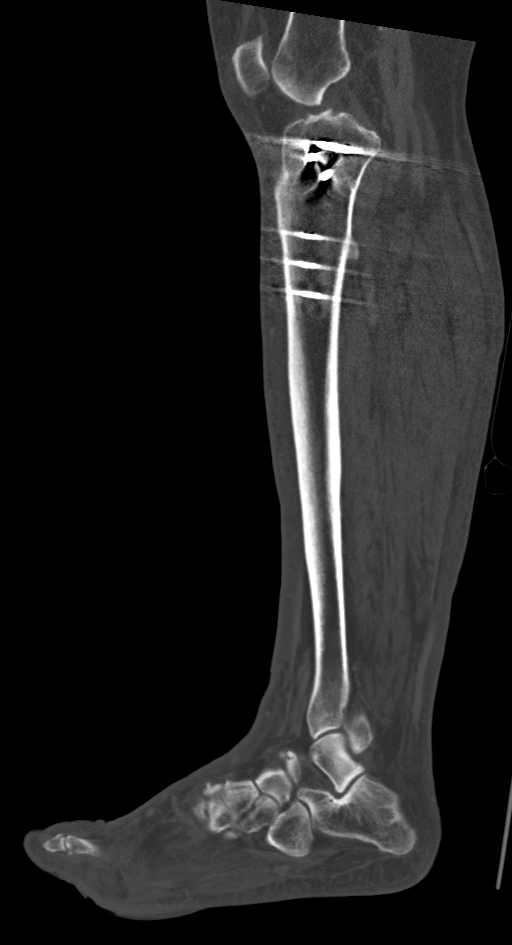
[im 51/88  bone]
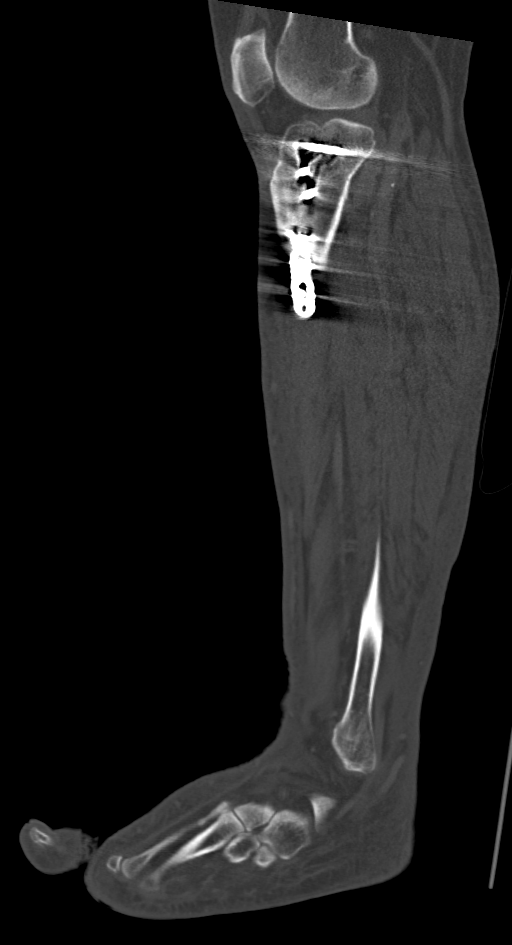
[im 59/88  bone]
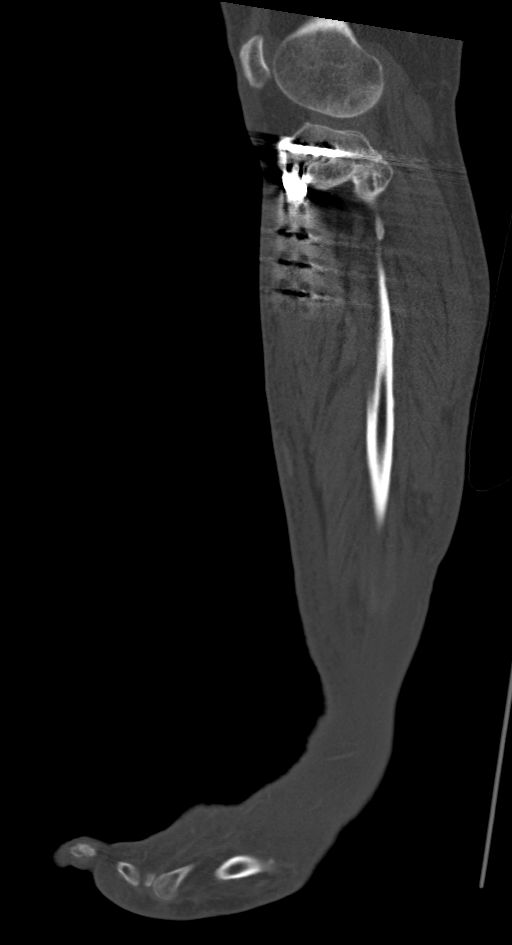

[10 of 33 positions shown; findings below may reference images not displayed]

FINDINGS: There appears to be a focal 2.6 x 2.4 x 1.7 cm abscess containing
fluid and air plantar to the medial aspect of the first
metatarsophalangeal joint. There is suggestion of minimal extension
of air more superiorly, with soft tissue edema extending about the
first metatarsophalangeal joint. An overlying soft tissue laceration
is seen. The appearance of the air within the abscess raises
question for a gas-producing organism, though no definite diffuse
necrotizing fasciitis is yet seen.

Mild diffuse soft tissue edema extends about the foot, ankle and
lower leg, up to the level of the knee. Soft tissue edema is more
prominent laterally and anteriorly. No additional abscess or soft
tissue air is identified. No significant knee joint effusion is
seen.

The visualized vasculature is unremarkable in appearance. The
visualized flexor and extensor tendons are grossly unremarkable. The
Achilles tendon remains intact. The peroneal tendons are
unremarkable. The visualized musculature is unremarkable. There is
no evidence of inflammation deep to the fascia layers.

A plate and screws are noted along the proximal tibia. There is
chronic degenerative change at the tibiofibular articulation. An os
naviculare is noted. There is no evidence of fracture or
dislocation. No definite osseous erosion is seen to suggest
osteomyelitis at this time.
IMPRESSION: 1. Focal 2.6 x 2 4 x 1.7 cm abscess containing fluid and air from a
plantar to the medial aspect of the first metatarsophalangeal joint.
Suggestion of minimal extension of air more superiorly, with soft
tissue edema extending about the first metatarsophalangeal joint.
Overlying soft tissue laceration seen. The appearance of the air
within the abscess raises concern for a gas-producing organism,
though no definite diffuse necrotizing fasciitis is yet seen.
2. Mild diffuse soft tissue edema extends about the foot, ankle and
lower leg, to the level of the knee. Soft tissue edema is more
prominent laterally and anteriorly. No additional abscess or soft
tissue air seen.
3. No osseous erosions seen.

These results were called by telephone at the time of interpretation
on 01/17/2015 at [DATE] to Dr. INDINI ELJALALUDDIN , who verbally
acknowledged these results.

## 2016-06-10 ENCOUNTER — Other Ambulatory Visit: Payer: Self-pay | Admitting: Cardiovascular Disease

## 2016-09-09 ENCOUNTER — Ambulatory Visit: Payer: Medicare HMO | Admitting: Cardiovascular Disease

## 2016-10-08 ENCOUNTER — Ambulatory Visit: Payer: Medicare HMO | Admitting: Cardiovascular Disease

## 2016-10-21 NOTE — Progress Notes (Deleted)
Cardiology Office Note  Date:  10/21/2016   ID:  [name], DOB Jul 07, 1956, MRN [id number]  PCP:  [name]   No chief complaint on file.   HPI:  [name] is a 61 year old gentleman with history of smoking since the age of 22 who continues to smoke, diabetes type 2, hyperlipidemia, COPD, secondary erythrocytosis, managed by outpatient hematology with periodic phlebotomy with recent admission to the hospital 10/21/2011 with discharge March 20 for ischemic toe.  History of cardiomyopathy, ejection fraction 35-40%. Catheterization in 2015 previously showed distal LAD and RCA disease, medical management recommended He presents today for follow-up of his coronary artery disease.  In follow-up today, he reports that he is doing well. He stop smoking more than one year ago, uses e-cigarette. For the past 6 visits to the cancer Center, he has not needed phlebotomy since he stop smoking. He has neuropathy but this is much better on Lyrica Hemoglobin A1c 8.4 No regular exercise program. Weight continues to be a problem He does report having occasional shortness of breath Blood work November 2015 showing normal LFTs, creatinine 1.53 EKG on today's visit shows normal sinus rhythm with rate 66 bpm, left bundle branch block  Other past medical history  Echocardiogram was done in the hospital to rule out embolic source. It was felt he had small plaque with emboli causing his ischemic toe. He was treated with anticoagulation in the hospital with improvement of his symptoms. Unable to exclude erythrocytosis as a cause of his symptoms and he did have phlebotomy of one unit while in the hospital. Echocardiogram read by outside physician detailed ejection fraction 35-40% mild MR and TR No other cardiac workup  done in the hospital  He was seen in the clinic and for syhowedmptoms of shortness of breath, chest pain, with left bundle branch block, depressed ejection fraction, had  a cardiac catheterization. severe distal LAD disease, severe distal RCA disease. No intervention performed, medical management recommended   PMH:   has a past medical history of Chronic systolic CHF (congestive heart failure) ([name]); CKD (chronic kidney disease), stage III; Collagen vascular disease ([name]); COPD (chronic obstructive pulmonary disease) ([name]); Coronary artery disease, non-occlusive; Hyperlipidemia; Hypertension; Ischemic cardiomyopathy; Ischemic toe; LBBB (left bundle branch block); Peripheral neuropathy ([name]); Secondary erythrocytosis; Tobacco abuse; and Type II diabetes mellitus ([name]).  PSH:    Past Surgical History:  Procedure Laterality Date  . ACHILLES TENDON SURGERY Left 03/08/2016   Procedure: ACHILLES LENGTHENING/KIDNER;  Surgeon: [name], DPM;  Location: [facility];  Service: Podiatry;  Laterality: Left;  . I&D EXTREMITY Left 01/18/2015   Procedure: IRRIGATION AND DEBRIDEMENT EXTREMITY;  Surgeon: [name], DPM;  Location: [facility];  Service: Podiatry;  Laterality: Left;  . ORIF TIBIA FRACTURE Left 2008  . SKIN DEBRIDEMENT  03/08/2016   Procedure: DEBRIDEMENT SKIN FULL THICKNESS;  Surgeon: [name], DPM;  Location: [facility];  Service: Podiatry;;  . WOUND EXPLORATION Left 03/10/2015   Procedure: WOUND EXPLORATION/ SECONDARY CLOSURE OF WOUND,COMPLICATED;  Surgeon: [name], DPM;  Location: [facility];  Service: Podiatry;  Laterality: Left;    Current Outpatient Prescriptions  Medication Sig Dispense Refill  . albuterol (PROVENTIL HFA;VENTOLIN HFA) 108 (90 BASE) MCG/ACT inhaler Inhale 2 puffs into the lungs every 4 (four) hours as needed for wheezing or shortness of breath.    . canagliflozin (INVOKANA) 100 MG TABS tablet Take 100 mg by mouth daily before breakfast.    . clopidogrel (PLAVIX) 75 MG tablet Take 75 mg by  mouth daily with breakfast.    . glipiZIDE (GLUCOTROL XL) 10 MG 24 hr tablet Take 10 mg by mouth daily.  3  . HYDROcodone-acetaminophen (NORCO)  5-325 MG tablet Take 1 tablet by mouth every 6 (six) hours as needed for moderate pain. 15 tablet 0  . HYDROcodone-acetaminophen (NORCO) 5-325 MG tablet Take 1 tablet by mouth every 6 (six) hours as needed for moderate pain. 30 tablet 0  . isosorbide mononitrate (IMDUR) 30 MG 24 hr tablet TAKE 1/2 TABLET BY MOUTH DAILY. 45 tablet 2  . linagliptin (TRADJENTA) 5 MG TABS tablet Take 5 mg by mouth every morning.     Marland Kitchen lisinopril (PRINIVIL,ZESTRIL) 20 MG tablet Take 10 mg by mouth every morning.     . loratadine (CLARITIN) 10 MG tablet Take 10 mg by mouth daily as needed for allergies (seasonal allergies in the spring).     Marland Kitchen lovastatin (MEVACOR) 40 MG tablet Take 40 mg by mouth at bedtime.    . metoprolol tartrate (LOPRESSOR) 25 MG tablet Take 1 tablet (25 mg total) by mouth 2 (two) times daily. 180 tablet 3  . nitroGLYCERIN (NITROSTAT) 0.4 MG SL tablet Place 1 tablet (0.4 mg total) under the tongue every 5 (five) minutes as needed for chest pain. 25 tablet 3  . pregabalin (LYRICA) 100 MG capsule Take 100 mg by mouth 3 (three) times daily.    . sertraline (ZOLOFT) 100 MG tablet Take 100 mg by mouth at bedtime.    . traZODone (DESYREL) 150 MG tablet TAKE 1 TABLET BY MOUTH AT BEDTIME. 30 tablet 0   No current facility-administered medications for this visit.      Allergies:   Patient has no known allergies.   Social History:  The patient  reports that he has been smoking Cigarettes and E-cigarettes.  He has a 11.50 pack-year smoking history. He has never used smokeless tobacco. He reports that he drinks alcohol. He reports that he does not use drugs.   Family History:   family history includes Diabetes in his brother; Hyperlipidemia in his brother, brother, brother, and mother; Hypertension in his brother, brother, brother, maternal aunt, and mother.    Review of Systems: ROS   PHYSICAL EXAM: VS:  There were no vitals taken for this visit. , BMI There is no height or weight on file to  calculate BMI. GEN: Well nourished, well developed, in no acute distress HEENT: normal Neck: no JVD, carotid bruits, or masses Cardiac: RRR; no murmurs, rubs, or gallops,no edema  Respiratory:  clear to auscultation bilaterally, normal work of breathing GI: soft, nontender, nondistended, + BS MS: no deformity or atrophy Skin: warm and dry, no rash Neuro:  Strength and sensation are intact Psych: euthymic mood, full affect    Recent Labs: 12/18/2015: ALT 16 02/29/2016: BUN 9; Creatinine, Ser 1.43; Hemoglobin 13.5; Platelets 136; Potassium 4.1; Sodium 132    Lipid Panel No results found for: CHOL, HDL, LDLCALC, TRIG    Wt Readings from Last 3 Encounters:  03/08/16 (!) 350 lb (158.8 kg)  03/01/16 (!) 349 lb 4 oz (158.4 kg)  02/29/16 (!) 360 lb (163.3 kg)       ASSESSMENT AND PLAN:  No diagnosis found.   Disposition:   F/U  6 months  No orders of the defined types were placed in this encounter.    Signed, Dossie Arbour, M.D., Ph.D. 10/21/2016  [facility] [place], [place] [phone]

## 2016-10-22 ENCOUNTER — Ambulatory Visit: Payer: Medicare HMO | Admitting: Cardiovascular Disease

## 2016-12-13 ENCOUNTER — Other Ambulatory Visit (INDEPENDENT_AMBULATORY_CARE_PROVIDER_SITE_OTHER): Payer: Self-pay | Admitting: Vascular Surgery

## 2017-04-10 ENCOUNTER — Other Ambulatory Visit: Payer: Self-pay | Admitting: Cardiovascular Disease

## 2017-04-10 ENCOUNTER — Telehealth: Payer: Self-pay

## 2017-04-10 NOTE — Telephone Encounter (Signed)
-----   Message from Festus Aloe, CMA sent at 04/10/2017 12:50 PM EDT ----- Patient needs a follow up with Dr. Mariah Milling; awaiting refill on medications.  Thanks! Jasmine December

## 2017-04-10 NOTE — Telephone Encounter (Signed)
l mom to call and schedule past due appt.

## 2017-04-11 NOTE — Telephone Encounter (Signed)
L MOM to schedule past due appt

## 2017-04-16 ENCOUNTER — Other Ambulatory Visit: Payer: Self-pay | Admitting: Cardiovascular Disease

## 2017-05-19 ENCOUNTER — Ambulatory Visit: Payer: Medicare HMO | Admitting: Nurse Practitioner

## 2017-05-19 ENCOUNTER — Encounter: Payer: Self-pay | Admitting: Nurse Practitioner

## 2017-05-21 NOTE — Progress Notes (Signed)
Cardiology Office Note  Date:  05/22/2017   ID:  [name], [name] 1956-04-12, MRN [id number]  PCP:  [facility]   Chief Complaint  Patient presents with  . other    F/u testing. Meds reviewed verbally with pt.    HPI:  Mr. Sumler is a 61 year old gentleman with history of  smoking since the age of 21 who continues to smoke, COPD diabetes type 2,  hyperlipidemia,  secondary erythrocytosis, managed by outpatient hematology with periodic phlebotomy with admission to the hospital 10/21/2011 with discharge March 20 for ischemic toe.  cardiomyopathy, ejection fraction 35-40%. Catheterization in 2015 previously showed distal LAD and RCA disease, medical management recommended He presents today for follow-up of his coronary artery disease.  Stress test 03/04/2016 No ischemia, small region of moderate intensity fixed defect apical region felt secondary to attenuation artifact Low risk scan  On today's visit he denies any chest pain or shortness of breath with exertion Sedentary at baseline Reports having lots ofbleedingon the Plavix  He stop smoking, mainly using the vapor cigarettes  He does not go to the cancer center for phlebotomy anymore Followed at [facility], [place] [facility] Most recent lab work not available, we have requested  He has neuropathy but this is much better on Lyrica Previous Hemoglobin A1c 8.4  EKG personally reviewed by myself on todays visit  showsNormal sinus rhythm rate 78 bpm left bundle branch block  Other past medical history  Echocardiogram was done in the hospital to rule out embolic source. It was felt he had small plaque with emboli causing his ischemic toe. He was treated with anticoagulation in the hospital with improvement of his symptoms. Unable to exclude erythrocytosis as a cause of his symptoms and he did have phlebotomy of one unit while in the hospital. Echocardiogram read by outside physician detailed ejection  fraction 35-40% mild MR and TR No other cardiac workup  done in the hospital  He was seen in the clinic and for syhowedmptoms of shortness of breath, chest pain, with left bundle branch block, depressed ejection fraction, had a cardiac catheterization. severe distal LAD disease, severe distal RCA disease. No intervention performed, medical management recommended   PMH:   has a past medical history of Chronic systolic CHF (congestive heart failure) ([name]); CKD (chronic kidney disease), stage III ([name]); Collagen vascular disease ([name]); COPD (chronic obstructive pulmonary disease) ([name]); Coronary artery disease, non-occlusive; Hyperlipidemia; Hypertension; Ischemic cardiomyopathy; Ischemic toe; LBBB (left bundle branch block); Peripheral neuropathy; Secondary erythrocytosis; Tobacco abuse; and Type II diabetes mellitus ([name]).  PSH:    Past Surgical History:  Procedure Laterality Date  . ACHILLES TENDON SURGERY Left 03/08/2016   Procedure: ACHILLES LENGTHENING/KIDNER;  Surgeon: Gwyneth Revels, DPM;  Location: [facility];  Service: Podiatry;  Laterality: Left;  . I&D EXTREMITY Left 01/18/2015   Procedure: IRRIGATION AND DEBRIDEMENT EXTREMITY;  Surgeon: Gwyneth Revels, DPM;  Location: [facility];  Service: Podiatry;  Laterality: Left;  . ORIF TIBIA FRACTURE Left 2008  . SKIN DEBRIDEMENT  03/08/2016   Procedure: DEBRIDEMENT SKIN FULL THICKNESS;  Surgeon: Gwyneth Revels, DPM;  Location: [facility];  Service: Podiatry;;  . WOUND EXPLORATION Left 03/10/2015   Procedure: WOUND EXPLORATION/ SECONDARY CLOSURE OF WOUND,COMPLICATED;  Surgeon: Gwyneth Revels, DPM;  Location: [facility];  Service: Podiatry;  Laterality: Left;    Current Outpatient Prescriptions  Medication Sig Dispense Refill  . albuterol (PROVENTIL HFA;VENTOLIN HFA) 108 (90 BASE) MCG/ACT inhaler Inhale 2 puffs into the lungs every 4 (four) hours as  needed for wheezing or shortness of breath.    . clopidogrel (PLAVIX) 75 MG tablet TAKE 1 TABLET BY MOUTH  DAILY 90 tablet 1  . glipiZIDE (GLUCOTROL XL) 10 MG 24 hr tablet Take 10 mg by mouth daily.  3  . isosorbide mononitrate (IMDUR) 30 MG 24 hr tablet GIVE "" 1/2 TABLET BY MOUTH EVERY DAY 30 tablet 0  . linagliptin (TRADJENTA) 5 MG TABS tablet Take 5 mg by mouth every morning.     Marland Kitchen. lisinopril (PRINIVIL,ZESTRIL) 20 MG tablet Take 10 mg by mouth every morning.     . loratadine (CLARITIN) 10 MG tablet Take 10 mg by mouth daily as needed for allergies (seasonal allergies in the spring).     Marland Kitchen. lovastatin (MEVACOR) 40 MG tablet Take 40 mg by mouth at bedtime.    . metoprolol tartrate (LOPRESSOR) 25 MG tablet Take 1 tablet (25 mg total) by mouth 2 (two) times daily. 180 tablet 3  . nitroGLYCERIN (NITROSTAT) 0.4 MG SL tablet Place 1 tablet (0.4 mg total) under the tongue every 5 (five) minutes as needed for chest pain. 25 tablet 3  . pregabalin (LYRICA) 100 MG capsule Take 100 mg by mouth 3 (three) times daily.    . sertraline (ZOLOFT) 100 MG tablet Take 100 mg by mouth at bedtime.    . traZODone (DESYREL) 150 MG tablet TAKE 1 TABLET BY MOUTH AT BEDTIME. 30 tablet 0   No current facility-administered medications for this visit.      Allergies:   Patient has no known allergies.   Social History:  The patient  reports that he has been smoking Cigarettes and E-cigarettes.  He has a 11.50 pack-year smoking history. He has never used smokeless tobacco. He reports that he drinks alcohol. He reports that he does not use drugs.   Family History:   family history includes Diabetes in his brother; Hyperlipidemia in his brother, brother, brother, and mother; Hypertension in his brother, brother, brother, maternal aunt, and mother.    Review of Systems: Review of Systems  Constitutional: Negative.   Respiratory: Negative.   Cardiovascular: Negative.   Gastrointestinal: Negative.   Musculoskeletal: Negative.   Neurological: Negative.   Psychiatric/Behavioral: Negative.   All other systems reviewed and are  negative.    PHYSICAL EXAM: VS:  BP 100/70 (BP Location: Left Arm, Patient Position: Sitting, Cuff Size: Large)   Pulse 78   Ht 6' (1.829 m)   Wt (!) 340 lb 8 oz (154.4 kg)   BMI 46.18 kg/m  , BMI Body mass index is 46.18 kg/m.  GEN: Well nourished, well developed, in no acute distress , obese HEENT: normal  Neck: no JVD, carotid bruits, or masses Cardiac: RRR; no murmurs, rubs, or gallops,no edema  Respiratory:  clear to auscultation bilaterally, normal work of breathing GI: soft, nontender, nondistended, + BS MS: no deformity or atrophy  Skin: warm and dry, no rash Neuro:  Strength and sensation are intact Psych: euthymic mood, full affect    Recent Labs: No results found for requested labs within last 8760 hours.    Lipid Panel No results found for: CHOL, HDL, LDLCALC, TRIG    Wt Readings from Last 3 Encounters:  05/22/17 (!) 340 lb 8 oz (154.4 kg)  03/08/16 (!) 350 lb (158.8 kg)  03/01/16 (!) 349 lb 4 oz (158.4 kg)       ASSESSMENT AND PLAN:  Coronary artery disease of native artery of native heart with stable angina pectoris ([name]) - Plan:  EKG 12-Lead Continue aspirin, aggressive lipid management with LDL goal less than 70  No further testing at this time,  denies any angina  Mixed hyperlipidemia Records have been requested from [facility] Goal LDL less than 70  Congestive dilated cardiomyopathy ([name]) - Plan: EKG 12-Lead Appears relatively euvolemic Having orthostasis symptoms on low-dose lisinopril and isosorbide Recommended he stop the isosorbide stay on lisinopril 10  Chronic combined systolic and diastolic CHF (congestive heart failure) ([name]) - Plan: EKG 12-Lead As above, appears euvolemic, not on a diuretic  Morbid obesity ([name]) We have encouraged continued exercise, careful diet management in an effort to lose weight.  Type 2 diabetes mellitus with other circulatory complication, with long-term current use of insulin ([name]) Previous  hemoglobin A1c above goal We have requested most recent numbers for our records  Smoker Long discussion, recommended he stop smoking and nicotine inhalers  CKD (chronic kidney disease), stage III ([name]) Followed by primary care  Disposition:   F/U  12 months   Total encounter time more than 25 minutes  Greater than 50% was spent in counseling and coordination of care with the patient    Orders Placed This Encounter  Procedures  . EKG 12-Lead     Signed, Dossie Arbour, M.D., Ph.D. 05/22/2017  [facility] [place], [place] [phone]

## 2017-05-22 ENCOUNTER — Encounter: Payer: Self-pay | Admitting: Cardiovascular Disease

## 2017-05-22 ENCOUNTER — Ambulatory Visit (INDEPENDENT_AMBULATORY_CARE_PROVIDER_SITE_OTHER): Payer: Medicare HMO | Admitting: Cardiovascular Disease

## 2017-05-22 VITALS — BP 100/70 | HR 78 | Ht 72.0 in | Wt 340.5 lb

## 2017-05-22 DIAGNOSIS — N183 Chronic kidney disease, stage 3 unspecified: Secondary | ICD-10-CM

## 2017-05-22 DIAGNOSIS — Z794 Long term (current) use of insulin: Secondary | ICD-10-CM | POA: Diagnosis not present

## 2017-05-22 DIAGNOSIS — I5042 Chronic combined systolic (congestive) and diastolic (congestive) heart failure: Secondary | ICD-10-CM

## 2017-05-22 DIAGNOSIS — F17200 Nicotine dependence, unspecified, uncomplicated: Secondary | ICD-10-CM

## 2017-05-22 DIAGNOSIS — I420 Dilated cardiomyopathy: Secondary | ICD-10-CM | POA: Diagnosis not present

## 2017-05-22 DIAGNOSIS — I25118 Atherosclerotic heart disease of native coronary artery with other forms of angina pectoris: Secondary | ICD-10-CM

## 2017-05-22 DIAGNOSIS — E782 Mixed hyperlipidemia: Secondary | ICD-10-CM | POA: Diagnosis not present

## 2017-05-22 DIAGNOSIS — E1159 Type 2 diabetes mellitus with other circulatory complications: Secondary | ICD-10-CM | POA: Diagnosis not present

## 2017-05-22 MED ORDER — LISINOPRIL 10 MG PO TABS: 10.0000 mg | ORAL_TABLET | ORAL | 3 refills | Status: DC

## 2017-05-22 NOTE — Patient Instructions (Addendum)
Medication Instructions:   Please stop the isosorbide Stay on the lisinopril 10 mg daily  If you continue to have dizzy spells on lisinopril 10 mg, cut the pill in 1/2 daily  Please stop the plavix  Labwork:  No new labs needed  Testing/Procedures:  No further testing at this time   Follow-Up: It was a pleasure seeing you in the office today. Please call us if you have new issues that need to be addressed before your next appt.  [phone]  Your physician wants you to follow-up in: 12 months.  You will receive a reminder letter in the mail two months in advance. If you don't receive a letter, please call our office to schedule the follow-up appointment.  If you need a refill on your cardiac medications before your next appointment, please call your pharmacy.

## 2017-05-24 ENCOUNTER — Encounter: Payer: Self-pay | Admitting: Cardiovascular Disease

## 2018-07-06 ENCOUNTER — Telehealth: Payer: Self-pay | Admitting: *Deleted

## 2018-07-06 NOTE — Telephone Encounter (Signed)
Received referral for low dose lung cancer screening CT scan. Message left at phone number listed in EMR for patient to call me back to facilitate scheduling scan.  

## 2018-07-08 ENCOUNTER — Telehealth: Payer: Self-pay | Admitting: *Deleted

## 2018-07-08 NOTE — Telephone Encounter (Signed)
Received referral for low dose lung cancer screening CT scan. Message left at phone number listed in EMR for patient to call me back to facilitate scheduling scan.  

## 2018-07-12 ENCOUNTER — Other Ambulatory Visit: Payer: Self-pay | Admitting: Cardiovascular Disease

## 2018-07-13 ENCOUNTER — Other Ambulatory Visit: Payer: Self-pay | Admitting: Cardiovascular Disease

## 2018-07-15 ENCOUNTER — Telehealth: Payer: Self-pay | Admitting: *Deleted

## 2018-07-15 ENCOUNTER — Encounter: Payer: Self-pay | Admitting: *Deleted

## 2018-07-15 NOTE — Telephone Encounter (Signed)
Received referral for low dose lung cancer screening CT scan. Message left at phone number listed in EMR for patient to call me back to facilitate scheduling scan.  

## 2018-08-11 ENCOUNTER — Other Ambulatory Visit: Payer: Self-pay | Admitting: Cardiovascular Disease

## 2018-08-11 NOTE — Telephone Encounter (Addendum)
Please review for refill last visit 05/2017, requesting refill for lisinopril 10 mg.

## 2018-08-13 NOTE — Telephone Encounter (Signed)
Patient will need appointment before refill or he could check with PCP.

## 2018-08-14 ENCOUNTER — Telehealth: Payer: Self-pay | Admitting: Cardiovascular Disease

## 2018-08-14 NOTE — Telephone Encounter (Signed)
LMOV to schedule follow up 

## 2018-08-14 NOTE — Telephone Encounter (Signed)
-----   Message from Aurelio Jew, [place] sent at 08/13/2018  1:09 PM EST ----- Please schedule appointment for refills or he could check with his PCP for refills per triage.

## 2018-08-18 NOTE — Telephone Encounter (Signed)
Call attempted to patient, [facility].  Call to [facility], spoke to pharm tech, pt has not had medications filled else where.   Will wait for pt to call back for scheduling.

## 2018-08-19 NOTE — Telephone Encounter (Signed)
No answer. Left message to call back.   

## 2018-08-25 NOTE — Telephone Encounter (Signed)
Multiple attempts made to reach pt, no return call. Encounter closed at this time.

## 2018-08-26 NOTE — Telephone Encounter (Signed)
LMOV for patient to call back.  Patient will need an appointment for refills.

## 2018-09-07 NOTE — Telephone Encounter (Signed)
Spoke with patients wife, Molli Knock per [place].  Confirmed that patient was still taking this medication.  Transferred to scheduling to make an appointment

## 2018-10-01 ENCOUNTER — Ambulatory Visit: Payer: Medicare HMO | Admitting: Nurse Practitioner

## 2018-10-20 ENCOUNTER — Telehealth: Payer: Self-pay

## 2018-10-20 NOTE — Telephone Encounter (Signed)
Attempted call to patient to reschedule appt that was originally set for this Thursday, I left a detailed message that we will be returning call to reschedule.

## 2018-10-20 NOTE — Telephone Encounter (Signed)
Patient cancelled appt and wants to reschedule in April

## 2018-10-22 ENCOUNTER — Ambulatory Visit: Payer: Medicare HMO | Admitting: Nurse Practitioner

## 2018-10-30 NOTE — Telephone Encounter (Signed)
LMOV to schedule Evisit in April with Dr. Mariah Milling

## 2018-11-02 NOTE — Telephone Encounter (Signed)
lmov to schedule  °

## 2018-11-05 NOTE — Telephone Encounter (Signed)
LMOVM to CB for scheduling E-visit.

## 2018-11-12 NOTE — Telephone Encounter (Signed)
LMOV for patient to call back for scheduling an EVISIT

## 2018-11-24 NOTE — Telephone Encounter (Signed)
Recall placed for 03/06/2019 

## 2019-02-28 ENCOUNTER — Other Ambulatory Visit: Payer: Self-pay | Admitting: Cardiovascular Disease

## 2019-04-20 ENCOUNTER — Telehealth: Payer: Self-pay | Admitting: Cardiovascular Disease

## 2019-04-20 NOTE — Telephone Encounter (Signed)
Please advise if ok to refill pt's Metoprolol Tartrate 25 mg tablet bid. Pt hasn't been seen since 05/2017. Pt requesting 90 day refill. Pt has upcoming appointment 05/17/2018.

## 2019-04-20 NOTE — Telephone Encounter (Signed)
°*  STAT* If patient is at the pharmacy, call can be transferred to refill team.   1. Which medications need to be refilled? (please list name of each medication and dose if known) metoprolol tartrate 25 MG 1 tablet 2 times daily   2. Which pharmacy/location (including street and city if local pharmacy) is medication to be sent to? [facility] in [place]   3. Do they need a 30 day or 90 day supply? 90 day

## 2019-05-04 ENCOUNTER — Other Ambulatory Visit: Payer: Self-pay | Admitting: Cardiovascular Disease

## 2019-05-04 ENCOUNTER — Other Ambulatory Visit: Payer: Self-pay

## 2019-05-04 MED ORDER — METOPROLOL TARTRATE 25 MG PO TABS: 25.0000 mg | ORAL_TABLET | Freq: Two times a day (BID) | ORAL | 0 refills | Status: DC

## 2019-05-04 NOTE — Telephone Encounter (Signed)
Please review appears last fill was 2016 by [facility], has not been seen recently.

## 2019-05-04 NOTE — Telephone Encounter (Signed)
*  STAT* If patient is at the pharmacy, call can be transferred to refill team.   1. Which medications need to be refilled? (please list name of each medication and dose if known) Metoprolol 2. Which pharmacy/location (including street and city if local pharmacy) is medication to be sent to? [facility]  3. Do they need a 30 day or 90 day supply? [place]

## 2019-05-11 ENCOUNTER — Other Ambulatory Visit: Payer: Self-pay | Admitting: Podiatry

## 2019-05-11 ENCOUNTER — Ambulatory Visit: Admission: RE | Admit: 2019-05-11 | Payer: Medicare HMO | Source: Ambulatory Visit

## 2019-05-11 DIAGNOSIS — I824Z2 Acute embolism and thrombosis of unspecified deep veins of left distal lower extremity: Secondary | ICD-10-CM

## 2019-05-16 NOTE — Progress Notes (Deleted)
Cardiology Office Note  Date:  05/16/2019   ID:  Stanley, Lyness 1956/04/14, MRN [id number]  PCP:  Inc, [facility]   No chief complaint on file.   HPI:  Mr. Perazzo is a 63 year old gentleman with history of  smoking since the age of 89 who continues to smoke, COPD diabetes type 2,  hyperlipidemia,  secondary erythrocytosis, managed by outpatient hematology with periodic phlebotomy with admission to the hospital 10/21/2011 with discharge March 20 for ischemic toe.  cardiomyopathy, ejection fraction 35-40%. Catheterization in 2015 previously showed distal LAD and RCA disease, medical management recommended He presents today for follow-up of his coronary artery disease.  Stress test 03/04/2016 No ischemia, small region of moderate intensity fixed defect apical region felt secondary to attenuation artifact Low risk scan  On today's visit he denies any chest pain or shortness of breath with exertion Sedentary at baseline Reports having lots ofbleedingon the Plavix  He stop smoking, mainly using the vapor cigarettes  He does not go to the cancer center for phlebotomy anymore Followed at [facility] [place] [facility] Most recent lab work not available, we have requested  He has neuropathy but this is much better on Lyrica Previous Hemoglobin A1c 8.4  EKG personally reviewed by myself on todays visit  showsNormal sinus rhythm rate 78 bpm left bundle branch block  Other past medical history  Echocardiogram was done in the hospital to rule out embolic source. It was felt he had small plaque with emboli causing his ischemic toe. He was treated with anticoagulation in the hospital with improvement of his symptoms. Unable to exclude erythrocytosis as a cause of his symptoms and he did have phlebotomy of one unit while in the hospital. Echocardiogram read by outside physician detailed ejection fraction 35-40% mild MR and TR No other cardiac workup  done in the  hospital  He was seen in the clinic and for syhowedmptoms of shortness of breath, chest pain, with left bundle branch block, depressed ejection fraction, had a cardiac catheterization. severe distal LAD disease, severe distal RCA disease. No intervention performed, medical management recommended   PMH:   has a past medical history of Chronic systolic CHF (congestive heart failure) ([name]), CKD (chronic kidney disease), stage III ([name]), Collagen vascular disease ([name]), COPD (chronic obstructive pulmonary disease) ([name]), Coronary artery disease, non-occlusive, Hyperlipidemia, Hypertension, Ischemic cardiomyopathy, Ischemic toe, LBBB (left bundle branch block), Peripheral neuropathy, Secondary erythrocytosis, Tobacco abuse, and Type II diabetes mellitus ([name]).  PSH:    Past Surgical History:  Procedure Laterality Date  . ACHILLES TENDON SURGERY Left 03/08/2016   Procedure: ACHILLES LENGTHENING/KIDNER;  Surgeon: Gwyneth Revels, DPM;  Location: [facility] ORS;  Service: Podiatry;  Laterality: Left;  . I&D EXTREMITY Left 01/18/2015   Procedure: IRRIGATION AND DEBRIDEMENT EXTREMITY;  Surgeon: Gwyneth Revels, DPM;  Location: [facility] ORS;  Service: Podiatry;  Laterality: Left;  . ORIF TIBIA FRACTURE Left 2008  . SKIN DEBRIDEMENT  03/08/2016   Procedure: DEBRIDEMENT SKIN FULL THICKNESS;  Surgeon: Gwyneth Revels, DPM;  Location: [facility] ORS;  Service: Podiatry;;  . WOUND EXPLORATION Left 03/10/2015   Procedure: WOUND EXPLORATION/ SECONDARY CLOSURE OF WOUND,COMPLICATED;  Surgeon: Gwyneth Revels, DPM;  Location: [facility] ORS;  Service: Podiatry;  Laterality: Left;    Current Outpatient Medications  Medication Sig Dispense Refill  . albuterol (PROVENTIL HFA;VENTOLIN HFA) 108 (90 BASE) MCG/ACT inhaler Inhale 2 puffs into the lungs every 4 (four) hours as needed for wheezing or shortness of breath.    Marland Kitchen glipiZIDE (GLUCOTROL XL) 10  MG 24 hr tablet Take 10 mg by mouth daily.  3  . linagliptin (TRADJENTA) 5 MG TABS tablet Take 5 mg by  mouth every morning.     Marland Kitchen lisinopril (ZESTRIL) 10 MG tablet TAKE 1 TABLET BY MOUTH EVERY MORNING 90 tablet 0  . loratadine (CLARITIN) 10 MG tablet Take 10 mg by mouth daily as needed for allergies (seasonal allergies in the spring).     Marland Kitchen lovastatin (MEVACOR) 40 MG tablet Take 40 mg by mouth at bedtime.    . metoprolol tartrate (LOPRESSOR) 25 MG tablet TAKE 1 TABLET(25 MG) BY MOUTH TWICE DAILY 180 tablet 0  . nitroGLYCERIN (NITROSTAT) 0.4 MG SL tablet Place 1 tablet (0.4 mg total) under the tongue every 5 (five) minutes as needed for chest pain. 25 tablet 3  . pregabalin (LYRICA) 100 MG capsule Take 100 mg by mouth 3 (three) times daily.    . sertraline (ZOLOFT) 100 MG tablet Take 100 mg by mouth at bedtime.    . traZODone (DESYREL) 150 MG tablet TAKE 1 TABLET BY MOUTH AT BEDTIME. 30 tablet 0   No current facility-administered medications for this visit.      Allergies:   Patient has no known allergies.   Social History:  The patient  reports that he has been smoking cigarettes and e-cigarettes. He has a 11.50 pack-year smoking history. He has never used smokeless tobacco. He reports current alcohol use. He reports that he does not use drugs.   Family History:   family history includes Diabetes in his brother; Hyperlipidemia in his brother, brother, brother, and mother; Hypertension in his brother, brother, brother, maternal aunt, and mother.    Review of Systems: Review of Systems  Constitutional: Negative.   Respiratory: Negative.   Cardiovascular: Negative.   Gastrointestinal: Negative.   Musculoskeletal: Negative.   Neurological: Negative.   Psychiatric/Behavioral: Negative.   All other systems reviewed and are negative.    PHYSICAL EXAM: VS:  There were no vitals taken for this visit. , BMI There is no height or weight on file to calculate BMI.  GEN: Well nourished, well developed, in no acute distress , obese HEENT: normal  Neck: no JVD, carotid bruits, or  masses Cardiac: RRR; no murmurs, rubs, or gallops,no edema  Respiratory:  clear to auscultation bilaterally, normal work of breathing GI: soft, nontender, nondistended, + BS MS: no deformity or atrophy  Skin: warm and dry, no rash Neuro:  Strength and sensation are intact Psych: euthymic mood, full affect    Recent Labs: No results found for requested labs within last 8760 hours.    Lipid Panel No results found for: CHOL, HDL, LDLCALC, TRIG    Wt Readings from Last 3 Encounters:  05/22/17 (!) 340 lb 8 oz (154.4 kg)  03/08/16 (!) 350 lb (158.8 kg)  03/01/16 (!) 349 lb 4 oz (158.4 kg)       ASSESSMENT AND PLAN:  Coronary artery disease of native artery of native heart with stable angina pectoris ([name]) - Plan: EKG 12-Lead Continue aspirin, aggressive lipid management with LDL goal less than 70  No further testing at this time,  denies any angina  Mixed hyperlipidemia Records have been requested from [facility] clinic Goal LDL less than 70  Congestive dilated cardiomyopathy ([name]) - Plan: EKG 12-Lead Appears relatively euvolemic Having orthostasis symptoms on low-dose lisinopril and isosorbide Recommended he stop the isosorbide stay on lisinopril 10  Chronic combined systolic and diastolic CHF (congestive heart failure) ([name]) - Plan: EKG 12-Lead  As above, appears euvolemic, not on a diuretic  Morbid obesity ([address]) We have encouraged continued exercise, careful diet management in an effort to lose weight.  Type 2 diabetes mellitus with other circulatory complication, with long-term current use of insulin ([name]) Previous hemoglobin A1c above goal We have requested most recent numbers for our records  Smoker Long discussion, recommended he stop smoking and nicotine inhalers  CKD (chronic kidney disease), stage III ([place]) Followed by primary care  Disposition:   F/U  12 months   Total encounter time more than 25 minutes  Greater than 50% was spent in counseling and  coordination of care with the patient    No orders of the defined types were placed in this encounter.    Signed, Esmond Plants, M.D., Ph.D. 05/16/2019  [place], [place]

## 2019-05-18 ENCOUNTER — Ambulatory Visit: Payer: Medicare HMO | Admitting: Cardiovascular Disease

## 2019-06-15 ENCOUNTER — Ambulatory Visit: Payer: Medicare HMO | Admitting: Cardiovascular Disease

## 2019-06-15 NOTE — Progress Notes (Deleted)
Cardiology Office Note  Date:  06/15/2019   ID:  [name], [name] 07-28-1956, MRN [id number]  PCP:  Inc, [facility]   No chief complaint on file.   HPI:  [name] is a 63 year old gentleman with history of  smoking since the age of 76 who continues to smoke, COPD diabetes type 2,  hyperlipidemia,  secondary erythrocytosis, managed by outpatient hematology with periodic phlebotomy with admission to the hospital 10/21/2011 with discharge March 20 for ischemic toe.  cardiomyopathy, ejection fraction 35-40%. Catheterization in 2015 previously showed distal LAD and RCA disease, medical management recommended He presents today for follow-up of his coronary artery disease.  Stress test 03/04/2016 No ischemia, small region of moderate intensity fixed defect apical region felt secondary to attenuation artifact Low risk scan  On today's visit he denies any chest pain or shortness of breath with exertion Sedentary at baseline Reports having lots ofbleedingon the Plavix  He stop smoking, mainly using the vapor cigarettes  He does not go to the cancer center for phlebotomy anymore Followed at [facility] [place] [facility] Most recent lab work not available, we have requested  He has neuropathy but this is much better on Lyrica Previous Hemoglobin A1c 8.4  EKG personally reviewed by myself on todays visit  showsNormal sinus rhythm rate 78 bpm left bundle branch block  Other past medical history  Echocardiogram was done in the hospital to rule out embolic source. It was felt he had small plaque with emboli causing his ischemic toe. He was treated with anticoagulation in the hospital with improvement of his symptoms. Unable to exclude erythrocytosis as a cause of his symptoms and he did have phlebotomy of one unit while in the hospital. Echocardiogram read by outside physician detailed ejection fraction 35-40% mild MR and TR No other cardiac workup  done in the  hospital  He was seen in the clinic and for syhowedmptoms of shortness of breath, chest pain, with left bundle branch block, depressed ejection fraction, had a cardiac catheterization. severe distal LAD disease, severe distal RCA disease. No intervention performed, medical management recommended   PMH:   has a past medical history of Chronic systolic CHF (congestive heart failure) ([name]), CKD (chronic kidney disease), stage III ([name]), Collagen vascular disease ([name]), COPD (chronic obstructive pulmonary disease) ([name]), Coronary artery disease, non-occlusive, Hyperlipidemia, Hypertension, Ischemic cardiomyopathy, Ischemic toe, LBBB (left bundle branch block), Peripheral neuropathy, Secondary erythrocytosis, Tobacco abuse, and Type II diabetes mellitus ([name]).  PSH:    Past Surgical History:  Procedure Laterality Date  . ACHILLES TENDON SURGERY Left 03/08/2016   Procedure: ACHILLES LENGTHENING/KIDNER;  Surgeon: Gwyneth Revels, DPM;  Location: [facility] ORS;  Service: Podiatry;  Laterality: Left;  . I&D EXTREMITY Left 01/18/2015   Procedure: IRRIGATION AND DEBRIDEMENT EXTREMITY;  Surgeon: Gwyneth Revels, DPM;  Location: [facility] ORS;  Service: Podiatry;  Laterality: Left;  . ORIF TIBIA FRACTURE Left 2008  . SKIN DEBRIDEMENT  03/08/2016   Procedure: DEBRIDEMENT SKIN FULL THICKNESS;  Surgeon: Gwyneth Revels, DPM;  Location: [facility] ORS;  Service: Podiatry;;  . WOUND EXPLORATION Left 03/10/2015   Procedure: WOUND EXPLORATION/ SECONDARY CLOSURE OF WOUND,COMPLICATED;  Surgeon: Gwyneth Revels, DPM;  Location: [facility] ORS;  Service: Podiatry;  Laterality: Left;    Current Outpatient Medications  Medication Sig Dispense Refill  . albuterol (PROVENTIL HFA;VENTOLIN HFA) 108 (90 BASE) MCG/ACT inhaler Inhale 2 puffs into the lungs every 4 (four) hours as needed for wheezing or shortness of breath.    Marland Kitchen glipiZIDE (GLUCOTROL XL) 10  MG 24 hr tablet Take 10 mg by mouth daily.  3  . linagliptin (TRADJENTA) 5 MG TABS tablet Take 5 mg by  mouth every morning.     Marland Kitchen lisinopril (ZESTRIL) 10 MG tablet TAKE 1 TABLET BY MOUTH EVERY MORNING 90 tablet 0  . loratadine (CLARITIN) 10 MG tablet Take 10 mg by mouth daily as needed for allergies (seasonal allergies in the spring).     Marland Kitchen lovastatin (MEVACOR) 40 MG tablet Take 40 mg by mouth at bedtime.    . metoprolol tartrate (LOPRESSOR) 25 MG tablet TAKE 1 TABLET(25 MG) BY MOUTH TWICE DAILY 180 tablet 0  . nitroGLYCERIN (NITROSTAT) 0.4 MG SL tablet Place 1 tablet (0.4 mg total) under the tongue every 5 (five) minutes as needed for chest pain. 25 tablet 3  . pregabalin (LYRICA) 100 MG capsule Take 100 mg by mouth 3 (three) times daily.    . sertraline (ZOLOFT) 100 MG tablet Take 100 mg by mouth at bedtime.    . traZODone (DESYREL) 150 MG tablet TAKE 1 TABLET BY MOUTH AT BEDTIME. 30 tablet 0   No current facility-administered medications for this visit.      Allergies:   Patient has no known allergies.   Social History:  The patient  reports that he has been smoking cigarettes and e-cigarettes. He has a 11.50 pack-year smoking history. He has never used smokeless tobacco. He reports current alcohol use. He reports that he does not use drugs.   Family History:   family history includes Diabetes in his brother; Hyperlipidemia in his brother, brother, brother, and mother; Hypertension in his brother, brother, brother, maternal aunt, and mother.    Review of Systems: Review of Systems  Constitutional: Negative.   Respiratory: Negative.   Cardiovascular: Negative.   Gastrointestinal: Negative.   Musculoskeletal: Negative.   Neurological: Negative.   Psychiatric/Behavioral: Negative.   All other systems reviewed and are negative.    PHYSICAL EXAM: VS:  There were no vitals taken for this visit. , BMI There is no height or weight on file to calculate BMI.  GEN: Well nourished, well developed, in no acute distress , obese HEENT: normal  Neck: no JVD, carotid bruits, or  masses Cardiac: RRR; no murmurs, rubs, or gallops,no edema  Respiratory:  clear to auscultation bilaterally, normal work of breathing GI: soft, nontender, nondistended, + BS MS: no deformity or atrophy  Skin: warm and dry, no rash Neuro:  Strength and sensation are intact Psych: euthymic mood, full affect    Recent Labs: No results found for requested labs within last 8760 hours.    Lipid Panel No results found for: CHOL, HDL, LDLCALC, TRIG    Wt Readings from Last 3 Encounters:  05/22/17 (!) 340 lb 8 oz (154.4 kg)  03/08/16 (!) 350 lb (158.8 kg)  03/01/16 (!) 349 lb 4 oz (158.4 kg)       ASSESSMENT AND PLAN:  Coronary artery disease of native artery of native heart with stable angina pectoris ([name]) - Plan: EKG 12-Lead Continue aspirin, aggressive lipid management with LDL goal less than 70  No further testing at this time,  denies any angina  Mixed hyperlipidemia Records have been requested from [facility] clinic Goal LDL less than 70  Congestive dilated cardiomyopathy ([name]) - Plan: EKG 12-Lead Appears relatively euvolemic Having orthostasis symptoms on low-dose lisinopril and isosorbide Recommended he stop the isosorbide stay on lisinopril 10  Chronic combined systolic and diastolic CHF (congestive heart failure) ([name]) - Plan: EKG 12-Lead  As above, appears euvolemic, not on a diuretic  Morbid obesity ([place]) We have encouraged continued exercise, careful diet management in an effort to lose weight.  Type 2 diabetes mellitus with other circulatory complication, with long-term current use of insulin ([name]) Previous hemoglobin A1c above goal We have requested most recent numbers for our records  Smoker Long discussion, recommended he stop smoking and nicotine inhalers  CKD (chronic kidney disease), stage III ([place]) Followed by primary care  Disposition:   F/U  12 months   Total encounter time more than 25 minutes  Greater than 50% was spent in counseling and  coordination of care with the patient    No orders of the defined types were placed in this encounter.    Signed, Esmond Plants, M.D., Ph.D. 06/15/2019  [place], [place]

## 2019-07-09 ENCOUNTER — Other Ambulatory Visit: Payer: Self-pay | Admitting: *Deleted

## 2019-07-09 NOTE — Telephone Encounter (Signed)
Noted  

## 2019-07-09 NOTE — Telephone Encounter (Signed)
Please advise if ok to refill Lisinopril 10 mg tablet qd. Pt hasn't been seen in over 2 yrs 05/22/2017. Pt has hx of scheduling and cancelling appointments. Please advise if ok to fill? Pt scheduled for future [place] f/u 07/13/2019 10:40.

## 2019-07-09 NOTE — Telephone Encounter (Signed)
Patient's refill can be sent when he keeps his 07/13/19 appt

## 2019-07-12 NOTE — Progress Notes (Signed)
Cardiology Office Note  Date:  07/13/2019   ID:  [name], [name] 06/27/1956, MRN [id number]  PCP:  [facility], [facility]   Chief Complaint  Patient presents with  . other    12 month past due follow up; last seen 2018. Meds reviewed by the pt. verbally. Pt. c/o shortness of breath.     HPI:  [name] is a 63 year old gentleman with history of  smoking since the age of 79 who continues to smoke, COPD diabetes type 2,  hyperlipidemia,  secondary erythrocytosis, managed by outpatient hematology with periodic phlebotomy with admission to the hospital 10/21/2011 with discharge March 20 for ischemic toe.  cardiomyopathy, ejection fraction 35-40%. Catheterization in 2015 previously showed distal LAD and RCA disease, medical management recommended He presents today for follow-up of his coronary artery disease.  In follow-up today reports that he is doing relatively well Chronic shortness of breath Continues to smoke 1 pack/day  Lab work reviewed with him in detail HAB1C 10.2, poor diet Total chol 116, LDL 64  No chest pain/angina No regular exercise Difficulty losing weight  Records requested from primary care, notes reviewed, lab work as above  Has never seen pulmonary before for his shortness of breath   neuropathy  better on Lyrica Could not afford it  EKG personally reviewed by myself on todays visit Shows normal sinus rhythm left bundle branch block rate 56 bpm  Other past medical history reviewed  stress test 03/04/2016 No ischemia, small region of moderate intensity fixed defect apical region felt secondary to attenuation artifact Low risk scan   Echocardiogram was done in the hospital to rule out embolic source. It was felt he had small plaque with emboli causing his ischemic toe. He was treated with anticoagulation in the hospital with improvement of his symptoms. Unable to exclude erythrocytosis as a cause of his symptoms and he did have phlebotomy  of one unit while in the hospital. Echocardiogram read by outside physician detailed ejection fraction 35-40% mild MR and TR No other cardiac workup  done in the hospital    PMH:   has a past medical history of Chronic systolic CHF (congestive heart failure) ([place]), CKD (chronic kidney disease), stage III, Collagen vascular disease ([place]), COPD (chronic obstructive pulmonary disease) ([place]), Coronary artery disease, non-occlusive, Hyperlipidemia, Hypertension, Ischemic cardiomyopathy, Ischemic toe, LBBB (left bundle branch block), Peripheral neuropathy, Secondary erythrocytosis, Tobacco abuse, and Type II diabetes mellitus ([place]).  PSH:    Past Surgical History:  Procedure Laterality Date  . ACHILLES TENDON SURGERY Left 03/08/2016   Procedure: ACHILLES LENGTHENING/KIDNER;  Surgeon: Samara Deist, DPM;  Location: [facility] OR[facility];  Service: Podiatry;  Laterality: Left;  . I&D EXTREMITY Left 01/18/2015   Procedure: IRRIGATION AND DEBRIDEMENT EXTREMITY;  Surgeon: Samara Deist, DPM;  Location: [facility] ORS;  Service: Podiatry;  Laterality: Left;  . ORIF TIBIA FRACTURE Left 2008  . SKIN DEBRIDEMENT  03/08/2016   Procedure: DEBRIDEMENT SKIN FULL THICKNESS;  Surgeon: Samara Deist, DPM;  Location: [facility];  Service: Podiatry;;  . WOUND EXPLORATION Left 03/10/2015   Procedure: WOUND EXPLORATION/ SECONDARY CLOSURE OF WOUND,COMPLICATED;  Surgeon: Samara Deist, DPM;  Location: [facility];  Service: Podiatry;  Laterality: Left;    Current Outpatient Medications  Medication Sig Dispense Refill  . albuterol (PROVENTIL HFA;VENTOLIN HFA) 108 (90 BASE) MCG/ACT inhaler Inhale 2 puffs into the lungs every 4 (four) hours as needed for wheezing or shortness of breath.    Marland Kitchen aspirin EC 81 MG tablet Take 81 mg by  mouth daily.    . citalopram (CELEXA) 10 MG tablet Take 10 mg by mouth daily.    Marland Kitchen glipiZIDE (GLUCOTROL XL) 10 MG 24 hr tablet Take 10 mg by mouth daily.  3  . isosorbide mononitrate (IMDUR) 30 MG 24 hr tablet Take 30 mg by  mouth daily.    Marland Kitchen linagliptin (TRADJENTA) 5 MG TABS tablet Take 5 mg by mouth every morning.     Marland Kitchen lisinopril (ZESTRIL) 10 MG tablet TAKE 1 TABLET BY MOUTH EVERY MORNING 90 tablet 0  . loratadine (CLARITIN) 10 MG tablet Take 10 mg by mouth daily as needed for allergies (seasonal allergies in the spring).     Marland Kitchen lovastatin (MEVACOR) 40 MG tablet Take 40 mg by mouth at bedtime.    . metoprolol tartrate (LOPRESSOR) 25 MG tablet TAKE 1 TABLET(25 MG) BY MOUTH TWICE DAILY 180 tablet 0  . nitroGLYCERIN (NITROSTAT) 0.4 MG SL tablet Place 1 tablet (0.4 mg total) under the tongue every 5 (five) minutes as needed for chest pain. 25 tablet 3  . sertraline (ZOLOFT) 100 MG tablet Take 100 mg by mouth at bedtime.    . traZODone (DESYREL) 150 MG tablet TAKE 1 TABLET BY MOUTH AT BEDTIME. 30 tablet 0   No current facility-administered medications for this visit.      Allergies:   Patient has no known allergies.   Social History:  The patient  reports that he has been smoking cigarettes and e-cigarettes. He has a 11.50 pack-year smoking history. He has never used smokeless tobacco. He reports current alcohol use. He reports that he does not use drugs.   Family History:   family history includes Diabetes in his brother; Hyperlipidemia in his brother, brother, brother, and mother; Hypertension in his brother, brother, brother, maternal aunt, and mother.    Review of Systems: Review of Systems  Constitutional: Negative.   HENT: Negative.   Respiratory: Positive for shortness of breath.   Cardiovascular: Negative.   Gastrointestinal: Negative.   Musculoskeletal: Negative.   Neurological: Negative.   Psychiatric/Behavioral: Negative.   All other systems reviewed and are negative.   PHYSICAL EXAM: VS:  BP 120/70 (BP Location: Left Arm, Patient Position: Sitting, Cuff Size: Large)   Pulse (!) 56   Ht 6' (1.829 m)   Wt (!) 320 lb 8 oz (145.4 kg)   BMI 43.47 kg/m  , BMI Body mass index is 43.47  kg/m.  GEN: Well nourished, well developed, in no acute distress , obese HEENT: normal  Neck: no JVD, carotid bruits, or masses Cardiac: RRR; no murmurs, rubs, or gallops,no edema  Respiratory:  clear to auscultation bilaterally, normal work of breathing GI: soft, nontender, nondistended, + BS MS: no deformity or atrophy  Skin: warm and dry, no rash Neuro:  Strength and sensation are intact Psych: euthymic mood, full affect   Recent Labs: No results found for requested labs within last 8760 hours.    Lipid Panel No results found for: CHOL, HDL, LDLCALC, TRIG    Wt Readings from Last 3 Encounters:  07/13/19 (!) 320 lb 8 oz (145.4 kg)  05/22/17 (!) 340 lb 8 oz (154.4 kg)  03/08/16 (!) 350 lb (158.8 kg)       ASSESSMENT AND PLAN:  Coronary artery disease of native artery of native heart with stable angina pectoris ([name]) - Plan: EKG 12-Lead Currently with no symptoms of angina. No further workup at this time. Continue current medication regimen. Stressed importance of better diabetes control  Mixed  hyperlipidemia Numbers at goal, reviewed with him no changes made to medications  Congestive dilated cardiomyopathy ([name]) - Plan: EKG 12-Lead No medication changes made Discussed repeating imaging studies  Shortness of breath Likely multifactorial, he is declining referral to pulmonary  Chronic combined systolic and diastolic CHF (congestive heart failure) ([name]) -  Not on a diuretic, in the past also not on a diuretic Appears relatively euvolemic Weight is actually trending downward likes to dietary changes  Morbid obesity ([name]) Stressed importance of continued weight loss as he is doing  Type 2 diabetes mellitus with other circulatory complication, with long-term current use of insulin ([name]) Impressive weight loss over the past year should help numbers  Smoker Long discussion, recommended he stop smoking and nicotine inhalers Discussed various strategies for smoking  cessation  CKD (chronic kidney disease), stage III ([name]) Followed by primary care  Disposition:   F/U  12 months   Total encounter time more than 25 minutes  Greater than 50% was spent in counseling and coordination of care with the patient    Orders Placed This Encounter  Procedures  . EKG 12-Lead     Signed, Dossie Arbour, M.D., Ph.D. 07/13/2019  [facility] [place], [place] [phone]

## 2019-07-13 ENCOUNTER — Ambulatory Visit (INDEPENDENT_AMBULATORY_CARE_PROVIDER_SITE_OTHER): Payer: Medicare HMO | Admitting: Cardiovascular Disease

## 2019-07-13 ENCOUNTER — Other Ambulatory Visit: Payer: Self-pay

## 2019-07-13 ENCOUNTER — Encounter: Payer: Self-pay | Admitting: Cardiovascular Disease

## 2019-07-13 VITALS — BP 120/70 | HR 56 | Ht 72.0 in | Wt 320.5 lb

## 2019-07-13 DIAGNOSIS — E782 Mixed hyperlipidemia: Secondary | ICD-10-CM

## 2019-07-13 DIAGNOSIS — I10 Essential (primary) hypertension: Secondary | ICD-10-CM | POA: Diagnosis not present

## 2019-07-13 DIAGNOSIS — I25118 Atherosclerotic heart disease of native coronary artery with other forms of angina pectoris: Secondary | ICD-10-CM | POA: Diagnosis not present

## 2019-07-13 DIAGNOSIS — I5042 Chronic combined systolic (congestive) and diastolic (congestive) heart failure: Secondary | ICD-10-CM

## 2019-07-13 DIAGNOSIS — I420 Dilated cardiomyopathy: Secondary | ICD-10-CM

## 2019-07-13 MED ORDER — NITROGLYCERIN 0.4 MG SL SUBL: 0.4000 mg | SUBLINGUAL_TABLET | SUBLINGUAL | 3 refills | Status: AC | PRN

## 2019-07-13 MED ORDER — LISINOPRIL 10 MG PO TABS: 10.0000 mg | ORAL_TABLET | Freq: Every morning | ORAL | 3 refills | Status: DC

## 2019-07-13 NOTE — Patient Instructions (Signed)
Medication Instructions:  Refill on NTG SL 0.4 mg   If you need a refill on your cardiac medications before your next appointment, please call your pharmacy.    Lab work: No new labs needed   If you have labs (blood work) drawn today and your tests are completely normal, you will receive your results only by: Marland Kitchen MyChart Message (if you have MyChart) OR . A paper copy in the mail If you have any lab test that is abnormal or we need to change your treatment, we will call you to review the results.   Testing/Procedures: No new testing needed   Follow-Up: At [facility], you and your health needs are our priority.  As part of our continuing mission to provide you with exceptional heart care, we have created designated Provider Care Teams.  These Care Teams include your primary Cardiologist (physician) and Advanced Practice Providers (APPs -  Physician Assistants and Nurse Practitioners) who all work together to provide you with the care you need, when you need it.  . You will need a follow up appointment in 12 months .   Please call our office 2 months in advance to schedule this appointment.    . Providers on your designated Care Team:   . Murray Hodgkins, NP . Christell Faith, PA-C . Marrianne Mood, PA-C  Any Other Special Instructions Will Be Listed Below (If Applicable).  For educational health videos Log in to : [email or web address] Or : [email or web address], password : [email or web address]

## 2019-08-23 ENCOUNTER — Telehealth: Payer: Self-pay | Admitting: Cardiovascular Disease

## 2019-08-23 MED ORDER — LISINOPRIL 10 MG PO TABS: 10.0000 mg | ORAL_TABLET | Freq: Every morning | ORAL | 0 refills | Status: DC

## 2019-08-23 MED ORDER — METOPROLOL TARTRATE 25 MG PO TABS: ORAL_TABLET | ORAL | 0 refills | Status: DC

## 2019-08-23 NOTE — Telephone Encounter (Signed)
*  STAT* If patient is at the pharmacy, call can be transferred to refill team.   1. Which medications need to be refilled? (please list name of each medication and dose if known)   Lisinopril  10 mg po q d  Metoprolol  25 mg po BID  2. Which pharmacy/location (including street and city if local pharmacy) is medication to be sent to? [facility] mail order   3. Do they need a 30 day or 90 day supply? 90

## 2019-08-23 NOTE — Telephone Encounter (Signed)
Requested Prescriptions   Signed Prescriptions Disp Refills  . lisinopril (ZESTRIL) 10 MG tablet 90 tablet 0    Sig: Take 1 tablet (10 mg total) by mouth every morning.    Authorizing Provider: Antonieta Iba    Ordering User: Shawnie Dapper, Dollie Bressi C  . metoprolol tartrate (LOPRESSOR) 25 MG tablet 180 tablet 0    Sig: TAKE 1 TABLET(25 MG) BY MOUTH TWICE DAILY    Authorizing Provider: Antonieta Iba    Ordering User: Kendrick Fries

## 2020-09-14 ENCOUNTER — Telehealth: Payer: Self-pay | Admitting: Cardiovascular Disease

## 2020-09-14 NOTE — Telephone Encounter (Signed)
3 attempts to schedule fu appt from recall list.   Deleting recall.   

## 2021-11-09 ENCOUNTER — Telehealth: Payer: Self-pay | Admitting: Cardiovascular Disease

## 2021-11-09 MED ORDER — ISOSORBIDE MONONITRATE ER 30 MG PO TB24: 30.0000 mg | ORAL_TABLET | Freq: Every day | ORAL | 0 refills | Status: DC

## 2021-11-09 NOTE — Telephone Encounter (Signed)
Please advise if ok to refill pt hasn't been seen since 07/2019* ?Pt has appointment scheduled asking for 30 day refill. ?

## 2021-11-09 NOTE — Telephone Encounter (Signed)
?*  STAT* If patient is at the pharmacy, call can be transferred to refill team. ? ? ?1. Which medications need to be refilled? (please list name of each medication and dose if known) isosorbide mononitrate (IMDUR) 30 MG 24 hr tablet ? ?2. Which pharmacy/location (including street and city if local pharmacy) is medication to be sent to? [id number] - [place], [place] - [address] AT [facility] OF [address] ? ?3. Do they need a 30 day or 90 day supply? 30 day  ? ? ?Patient has an appointment 5/4 ? ? ?

## 2021-12-05 NOTE — Progress Notes (Signed)
? ?Cardiology Office Note   ? ?Date:  12/06/2021  ? ?ID:  [name], DOB [birth year], MRN [id number] ? ?PCP:  [facility], [facility]  ?Cardiologist:  Ida Rogue, MD  ?Electrophysiologist:  None  ? ?Chief Complaint: Follow up ? ?History of Present Illness:  ? ?[name] is a 66 y.o. male with history of CAD medically managed by [facility] in 2015, HFrEF, LBBB, DM2 with neuropathy, HTN, HLD, COPD, CKD stage III, obesity, secondary erythrocytosis, chronic venous stasis dermatitis, and tobacco use who presents for follow-up of CAD. ? ?LHC in 2015 showed mid LAD 50%, distal LAD 90%, proximal LCX 30%, mid LCx 40%, proximal ramus 70%, proximal RCA 40%, distal RCA 50%, RPLS 99% stenoses as outlined below.  Echo in 10/2013 demonstrated an EF of 35 to 40%, global hypokinesis, mild mitral regurgitation, severely increased LV posterior wall thickness, and mild tricuspid regurgitation.  Most recent ischemic evaluation by pharmacological MPI showed no ischemia with a small region of moderate intensity fixed defect in the apical region felt to be secondary to attenuation artifact.  LVEF estimated at 38%, though this was felt to be depressed due to inferior wall/GI uptake.  Overall, this was a low risk scan.  Prior admission for ischemic toe felt to be secondary to small plaque emboli.  He was last seen in the office in 07/2019 and was without symptoms of angina or decompensation. ? ?He comes in today noting a 3-week history of progressive lower extremity swelling with associated weeping.  He reports a long history of chronic venous stasis dermatitis.  He does try and elevate his legs when sitting.  He denies any chest pain, dyspnea, palpitations, dizziness, presyncope, or syncope.  When compared to his last in person clinic visit, his weight is up 8 pounds.  He has been out of metoprolol for at least 8 months as he reports the printing on the pill bottle had worn off and he was unsure what medication he was missing.   He has not been maintained on a standing diuretic.  He does report adherence to aspirin, Imdur, lovastatin, and lisinopril.  He does not watch his salt or fluid intake.  He has stable two-pillow orthopnea.  No early satiety or PND.  No falls or bleeding concerns. ? ? ?Labs independently reviewed: ?No recent labs available for review ? ?Past Medical History:  ?Diagnosis Date  ? Chronic systolic CHF (congestive heart failure) ([place])   ? a. 10/2013 Echo: EF 35-40%.  ? CKD (chronic kidney disease), stage III ([place])   ? a. baseline creat 1.3 - 1.5.  ? Collagen vascular disease ([place])   ? COPD (chronic obstructive pulmonary disease) ([place])   ? Coronary artery disease, non-occlusive   ? a. 11/2013 MV: distal anteroseptal ischemia;  b. 12/2013 Cath: LM nl, LAD 79m, 90d, RI 70, LCX 81m, 40d, RCA 40p, 50d, RPLS 99, EF 40-45%, mild global HK-->Med rx; c. 03/2016 MV: fixed apical defect (attenuation), no ischemia, EF 38%.  ? Hyperlipidemia   ? Hypertension   ? Ischemic cardiomyopathy   ? a. 10/2013 Echo: EF 35-40%, mild MR/TR;  b. 12/2013 Cath: moderate diffuse CAD-->Med Rx.  ? Ischemic toe   ? a. 10/2013 L fifth toe - felt to be either 2/2 embolic plaque vs erythrocytosis - seen by vascular surgery ([place]) ->conservative rx.  ? LBBB (left bundle branch block)   ? Peripheral neuropathy   ? Secondary erythrocytosis   ? a. followed by heme-onc with periodic phlebotomy.  ?  Tobacco abuse   ? a. 46 yrs, up to 2 ppd, changed to e-cigarette 05/2013.  ? Type II diabetes mellitus ([place])   ? ? ?Past Surgical History:  ?Procedure Laterality Date  ? ACHILLES TENDON SURGERY Left 03/08/2016  ? Procedure: ACHILLES LENGTHENING/KIDNER;  Surgeon: Samara Deist, DPM;  Location: [facility] OR[facility];  Service: Podiatry;  Laterality: Left;  ? I & D EXTREMITY Left 01/18/2015  ? Procedure: IRRIGATION AND DEBRIDEMENT EXTREMITY;  Surgeon: Samara Deist, DPM;  Location: [facility] OR[facility];  Service: Podiatry;  Laterality: Left;  ? ORIF TIBIA FRACTURE Left 2008  ? SKIN DEBRIDEMENT  03/08/2016   ? Procedure: DEBRIDEMENT SKIN FULL THICKNESS;  Surgeon: Samara Deist, DPM;  Location: [facility] ORS;  Service: Podiatry;;  ? WOUND EXPLORATION Left 03/10/2015  ? Procedure: WOUND EXPLORATION/ SECONDARY CLOSURE OF WOUND,COMPLICATED;  Surgeon: Samara Deist, DPM;  Location: [facility] OR[facility];  Service: Podiatry;  Laterality: Left;  ? ? ?Current Medications: ?Current Meds  ?Medication Sig  ? albuterol (PROVENTIL HFA;VENTOLIN HFA) 108 (90 BASE) MCG/ACT inhaler Inhale 2 puffs into the lungs every 4 (four) hours as needed for wheezing or shortness of breath.  ? apixaban (ELIQUIS) 5 MG TABS tablet Take 1 tablet (5 mg total) by mouth 2 (two) times daily.  ? citalopram (CELEXA) 10 MG tablet Take 10 mg by mouth daily.  ? furosemide (LASIX) 20 MG tablet Take 1 tablet (20 mg total) by mouth daily.  ? glipiZIDE (GLUCOTROL XL) 10 MG 24 hr tablet Take 10 mg by mouth daily.  ? isosorbide mononitrate (IMDUR) 30 MG 24 hr tablet Take 1 tablet (30 mg total) by mouth daily.  ? linagliptin (TRADJENTA) 5 MG TABS tablet Take 5 mg by mouth every morning.   ? lisinopril (ZESTRIL) 10 MG tablet Take 1 tablet (10 mg total) by mouth every morning.  ? loratadine (CLARITIN) 10 MG tablet Take 10 mg by mouth daily as needed for allergies (seasonal allergies in the spring).   ? lovastatin (MEVACOR) 40 MG tablet Take 40 mg by mouth at bedtime.  ? metoprolol succinate (TOPROL XL) 25 MG 24 hr tablet Take 1 tablet (25 mg total) by mouth daily.  ? nitroGLYCERIN (NITROSTAT) 0.4 MG SL tablet Place 1 tablet (0.4 mg total) under the tongue every 5 (five) minutes as needed for chest pain.  ? sertraline (ZOLOFT) 100 MG tablet Take 100 mg by mouth at bedtime.  ? traZODone (DESYREL) 150 MG tablet TAKE 1 TABLET BY MOUTH AT BEDTIME.  ? [DISCONTINUED] aspirin EC 81 MG tablet Take 81 mg by mouth daily.  ? ? ?Allergies:   Metformin  ? ?Social History  ? ?Socioeconomic History  ? Marital status: Significant Other  ?  Spouse name: Not on file  ? Number of children: Not on file  ?  Years of education: Not on file  ? Highest education level: Not on file  ?Occupational History  ? Not on file  ?Tobacco Use  ? Smoking status: Every Day  ?  Packs/day: 0.25  ?  Years: 46.00  ?  Pack years: 11.50  ?  Types: Cigarettes, E-cigarettes  ? Smokeless tobacco: Never  ? Tobacco comments:  ?  Currently smoking E cigarettes.  ?Substance and Sexual Activity  ? Alcohol use: Yes  ?  Comment: alcohol abuse in the past. / rare beer  ? Drug use: No  ?  Comment: marijuana 5 joints per week in the past 2013  ? Sexual activity: Not on file  ?Other Topics Concern  ? Not on file  ?  Social History Narrative  ? He lives in [place] with his dtr and son-in-law.  On disability.  Does not routinely exercise.  Eats 6 scoops of banana ice cream every night.  ? ?Social Determinants of Health  ? ?Financial Resource Strain: Not on file  ?Food Insecurity: Not on file  ?Transportation Needs: Not on file  ?Physical Activity: Not on file  ?Stress: Not on file  ?Social Connections: Not on file  ?  ? ?Family History:  ?The patient's family history includes Diabetes in his brother; Hyperlipidemia in his brother, brother, brother, and mother; Hypertension in his brother, brother, brother, maternal aunt, and mother. ? ?ROS:   ?12 point review of systems is negative unless otherwise noted in the HPI. ? ? ?EKGs/Labs/Other Studies Reviewed:   ? ?Studies reviewed were summarized above. The additional studies were reviewed today: ? ?Lexiscan MPI 03/04/2016: ?2 Day Pharmacological myocardial perfusion imaging study with no significant  Ischemia ?Small region of moderate intensity, fixed defect in the apical region (seen on both stress and rest images), likely secondary to attenuation artifact ?Normal wall motion, EF estimated at 38% (depressed ejection fraction likely secondary to inferior wall / GI uptake artifact) ?No EKG changes concerning for ischemia at peak stress or in recovery. Baseline EKG with left bundle branch block ?Rare PVC ?Low  risk scan ?Consider echocardiogram to confirm ejection fraction if clinically indicated ?__________ ? ?[facility] 2015: ?Mid LAD 50%, distal LAD 90%, proximal LCX 30%, mid LCx 40%, proximal ramus 70%, proximal RC

## 2021-12-06 ENCOUNTER — Other Ambulatory Visit
Admission: RE | Admit: 2021-12-06 | Discharge: 2021-12-06 | Disposition: A | Discharge status: Home / Self Care | Payer: Medicare HMO | Source: Ambulatory Visit | Attending: Physician Assistant | Admitting: Physician Assistant

## 2021-12-06 ENCOUNTER — Telehealth: Payer: Self-pay | Admitting: Physician Assistant

## 2021-12-06 ENCOUNTER — Ambulatory Visit: Payer: Medicare HMO | Admitting: Physician Assistant

## 2021-12-06 ENCOUNTER — Encounter: Payer: Self-pay | Admitting: Physician Assistant

## 2021-12-06 VITALS — BP 138/80 | HR 68 | Ht 72.0 in | Wt 328.0 lb

## 2021-12-06 DIAGNOSIS — I872 Venous insufficiency (chronic) (peripheral): Secondary | ICD-10-CM

## 2021-12-06 DIAGNOSIS — N183 Chronic kidney disease, stage 3 unspecified: Secondary | ICD-10-CM | POA: Insufficient documentation

## 2021-12-06 DIAGNOSIS — I4891 Unspecified atrial fibrillation: Secondary | ICD-10-CM

## 2021-12-06 DIAGNOSIS — I5020 Unspecified systolic (congestive) heart failure: Secondary | ICD-10-CM

## 2021-12-06 DIAGNOSIS — I2510 Atherosclerotic heart disease of native coronary artery without angina pectoris: Secondary | ICD-10-CM | POA: Diagnosis not present

## 2021-12-06 DIAGNOSIS — Z720 Tobacco use: Secondary | ICD-10-CM

## 2021-12-06 DIAGNOSIS — I447 Left bundle-branch block, unspecified: Secondary | ICD-10-CM

## 2021-12-06 DIAGNOSIS — I10 Essential (primary) hypertension: Secondary | ICD-10-CM

## 2021-12-06 DIAGNOSIS — E785 Hyperlipidemia, unspecified: Secondary | ICD-10-CM

## 2021-12-06 DIAGNOSIS — Z789 Other specified health status: Secondary | ICD-10-CM

## 2021-12-06 LAB — CBC
HCT: 49.1 % (ref 39.0–52.0)
Hemoglobin: 15.8 g/dL (ref 13.0–17.0)
MCH: 27.1 pg (ref 26.0–34.0)
MCHC: 32.2 g/dL (ref 30.0–36.0)
MCV: 84.4 fL (ref 80.0–100.0)
Platelets: 142 10*3/uL — ABNORMAL LOW (ref 150–400)
RBC: 5.82 MIL/uL — ABNORMAL HIGH (ref 4.22–5.81)
RDW: 15.7 % — ABNORMAL HIGH (ref 11.5–15.5)
WBC: 7.9 10*3/uL (ref 4.0–10.5)
nRBC: 0 % (ref 0.0–0.2)

## 2021-12-06 LAB — COMPREHENSIVE METABOLIC PANEL
ALT: 17 U/L (ref 0–44)
AST: 32 U/L (ref 15–41)
Albumin: 2.9 g/dL — ABNORMAL LOW (ref 3.5–5.0)
Alkaline Phosphatase: 141 U/L — ABNORMAL HIGH (ref 38–126)
Anion gap: 7 (ref 5–15)
BUN: 10 mg/dL (ref 8–23)
CO2: 29 mmol/L (ref 22–32)
Calcium: 8.5 mg/dL — ABNORMAL LOW (ref 8.9–10.3)
Chloride: 99 mmol/L (ref 98–111)
Creatinine, Ser: 1.07 mg/dL (ref 0.61–1.24)
GFR, Estimated: >60 mL/min (ref >60)
Glucose, Bld: 120 mg/dL — ABNORMAL HIGH (ref 70–99)
Potassium: 3.5 mmol/L (ref 3.5–5.1)
Sodium: 135 mmol/L (ref 135–145)
Total Bilirubin: 1.3 mg/dL — ABNORMAL HIGH (ref 0.3–1.2)
Total Protein: 8.2 g/dL — ABNORMAL HIGH (ref 6.5–8.1)

## 2021-12-06 LAB — MAGNESIUM: Magnesium: 2.2 mg/dL (ref 1.7–2.4)

## 2021-12-06 LAB — TSH: TSH: 3.603 u[IU]/mL (ref 0.350–4.500)

## 2021-12-06 MED ORDER — FUROSEMIDE 20 MG PO TABS: 20.0000 mg | ORAL_TABLET | Freq: Every day | ORAL | 3 refills | Status: AC

## 2021-12-06 MED ORDER — APIXABAN 5 MG PO TABS: 5.0000 mg | ORAL_TABLET | Freq: Two times a day (BID) | ORAL | 11 refills | Status: AC

## 2021-12-06 MED ORDER — METOPROLOL SUCCINATE ER 25 MG PO TB24: 25.0000 mg | ORAL_TABLET | Freq: Every day | ORAL | 3 refills | Status: AC

## 2021-12-06 NOTE — Patient Instructions (Addendum)
Medication Instructions:  ?Your physician has recommended you make the following change in your medication:  ? ?STOP Aspirin ?START Eliquis 5 mg twice a day ?START Toprol XL 25 mg once daily ?START Furosemide 20 mg once daily ? ?*If you need a refill on your cardiac medications before your next appointment, please call your pharmacy* ? ? ?Lab Work: ?CBC, CMET, TSH, and Mag today over at the [facility] entrance and check in at registration.  ? ?If you have labs (blood work) drawn today and your tests are completely normal, you will receive your results only by: ?MyChart Message (if you have MyChart) OR ?A paper copy in the mail ?If you have any lab test that is abnormal or we need to change your treatment, we will call you to review the results. ? ? ?Testing/Procedures: ?Your physician has requested that you have an echocardiogram. Echocardiography is a painless test that uses sound waves to create images of your heart. It provides your doctor with information about the size and shape of your heart and how well your heart?s chambers and valves are working. This procedure takes approximately one hour. There are no restrictions for this procedure. ? ? ? ?Follow-Up: ?At [facility], you and your health needs are our priority.  As part of our continuing mission to provide you with exceptional heart care, we have created designated Provider Care Teams.  These Care Teams include your primary Cardiologist (physician) and Advanced Practice Providers (APPs -  Physician Assistants and Nurse Practitioners) who all work together to provide you with the care you need, when you need it. ? ?We recommend signing up for the patient portal called "MyChart".  Sign up information is provided on this After Visit Summary.  MyChart is used to connect with patients for Virtual Visits (Telemedicine).  Patients are able to view lab/test results, encounter notes, upcoming appointments, etc.  Non-urgent messages can be sent to your  provider as well.   ?To learn more about what you can do with MyChart, go to [email or web address].   ? ?Your next appointment:   ?3 week(s) ? ?The format for your next appointment:   ?In Person ? ?Provider:   ?Julien Nordmann, MD or Eula Listen, PA-C  ? ? ?Other Instructions ?Referral placed for wound clinic [phone] ? ?Please bring your social security statement and/or first 2 pages of your [phone] to your next appointment so we can complete patient assistance application.  ? ?Important Information About Sugar ? ? ? ? ?  ?

## 2021-12-06 NOTE — Telephone Encounter (Signed)
Completed patient assistance applications for both Eliquis and Entresto. Instructed patient to bring in income documents to next appointment so we can get those sent for consideration. Both Applications placed under "Patient Assistance" in (Nikisha Fleece's) files.  ?

## 2021-12-10 ENCOUNTER — Telehealth: Payer: Self-pay | Admitting: *Deleted

## 2021-12-10 ENCOUNTER — Ambulatory Visit (INDEPENDENT_AMBULATORY_CARE_PROVIDER_SITE_OTHER): Payer: Medicare HMO

## 2021-12-10 DIAGNOSIS — I10 Essential (primary) hypertension: Secondary | ICD-10-CM

## 2021-12-10 LAB — ECHOCARDIOGRAM COMPLETE
AR max vel: 1.53 cm2
AV Area VTI: 1.78 cm2
AV Area mean vel: 1.66 cm2
AV Mean grad: 12.7 mmHg
AV Peak grad: 23 mmHg
Ao pk vel: 2.4 m/s
Calc EF: 63.8 %
P 1/2 time: 705 msec
S' Lateral: 4.8 cm
Single Plane A2C EF: 58.8 %
Single Plane A4C EF: 69.8 %

## 2021-12-10 MED ORDER — PERFLUTREN LIPID MICROSPHERE
1.0000 mL | INTRAVENOUS | Status: AC | PRN
  Administered 2021-12-10: 4 mL via INTRAVENOUS

## 2021-12-10 NOTE — Telephone Encounter (Signed)
The patient has been notified of the result and verbalized understanding.  All questions (if any) were answered. ?Pt will follow up with PCP regarding abnormal labs. Labs routed to PCP at [facility] via [place] fax.  ?

## 2021-12-10 NOTE — Telephone Encounter (Signed)
The patient has been notified of the result and verbalized understanding.  All questions (if any) were answered.     

## 2021-12-10 NOTE — Telephone Encounter (Signed)
Attempted to call pt. No answer. Lmtcb.  

## 2021-12-10 NOTE — Telephone Encounter (Signed)
-----  Message from Rise Mu, PA-C sent at 12/09/2021  7:33 AM EDT ----- ?Random glucose is ok ?Renal function normal ?Potassium low normal ?Albumin is low ?Alk phos and total bilirubin are elevated ?Platelets are mildly low ? ?Recommendations: ?-Follow up with PCP for abnormal albumin (needs urine assessment for protein with them), abnormal liver function testing, and low platelets  ?-Follow up as planned  ?

## 2021-12-10 NOTE — Telephone Encounter (Signed)
-----   Message from Sondra Barges, PA-C sent at 12/10/2021  1:20 PM EDT ----- ?Please inform the patient his echo showed normal pump function, normal wall motion, mild thickening of the heart, trivially leaky mitral valve, mildly leaky and mildly narrowed aortic valve.  When compared to prior echo, his pump function has improved and is now normal.  Follow-up as planned. ?

## 2021-12-14 ENCOUNTER — Telehealth: Payer: Self-pay | Admitting: Cardiovascular Disease

## 2021-12-14 NOTE — Telephone Encounter (Signed)
Agree with pharmacy, there are no interactions identified between these medications.  ?

## 2021-12-14 NOTE — Telephone Encounter (Signed)
Reviewed with wife that there are no interactions with his Lyrica & Eliquis and message has been sent via Fax to his primary care provider. Encouraged her to reach out to them and let us know if they have any further questions. She was appreciative for the call back.   ?

## 2021-12-14 NOTE — Telephone Encounter (Signed)
There are no drug interactions between Lyrica and Eliquis ?

## 2021-12-14 NOTE — Telephone Encounter (Signed)
To Pharm D to please review.   

## 2021-12-14 NOTE — Telephone Encounter (Signed)
Pt c/o medication issue: ? ?1. Name of Medication:  ? apixaban (ELIQUIS) 5 MG TABS tablet  ? ? ?2. How are you currently taking this medication (dosage and times per day)? Take 1 tablet (5 mg total) by mouth 2 (two) times daily. ? ?3. Are you having a reaction (difficulty breathing--STAT)? No ? ?4. What is your medication issue?  Pt's wife states that pt's PCP will not refill his Lyrica  until PCP gets the ok due to pt being on above medication. Please advise ? ?

## 2021-12-17 NOTE — Telephone Encounter (Signed)
Pt's wife called back stating the PCP never received fax to okay the Lyrica medication.  ?Ph # to PCP : [phone] ?

## 2021-12-17 NOTE — Telephone Encounter (Signed)
Note sent to fax ? ?

## 2021-12-23 ENCOUNTER — Other Ambulatory Visit: Payer: Self-pay | Admitting: Cardiovascular Disease

## 2021-12-24 NOTE — H&P (View-Only) (Signed)
**Note [name]-Identified via Obfuscation** Cardiology Office Note    Date:  12/26/2021   ID:  Jeffrey, Diaz [birth year], MRN [id number]  PCP:  [name]  Cardiologist:  Jeffrey Rogue, MD  Electrophysiologist:  None   Chief Complaint: Follow-up  History of Present Illness:   [name] is a 66 y.o. male with history of CAD medically managed by [facility] in 2015, HFrEF, LBBB, DM2 with neuropathy, HTN, HLD, COPD, CKD stage III, obesity, secondary erythrocytosis, chronic venous stasis dermatitis, and tobacco use who presents for follow-up of CAD and cardiomyopathy.   LHC in 2015 showed mid LAD 50%, distal LAD 90%, proximal LCX 30%, mid LCx 40%, proximal ramus 70%, proximal RCA 40%, distal RCA 50%, RPLS 99% stenoses as outlined below.  Echo in 10/2013 demonstrated an EF of 35 to 40%, global hypokinesis, mild mitral regurgitation, severely increased LV posterior wall thickness, and mild tricuspid regurgitation.  Most recent ischemic evaluation by pharmacological MPI showed no ischemia with a small region of moderate intensity fixed defect in the apical region felt to be secondary to attenuation artifact.  LVEF estimated at 38%, though this was felt to be depressed due to inferior wall/GI uptake.  Overall, this was a low risk scan.  Prior admission for ischemic toe felt to be secondary to small plaque emboli.  He was last seen in the office in 07/2019 and was without symptoms of angina or decompensation.  He was lost to follow-up from 07/2019 until he was recently seen on 12/06/2021, at which time he noted a 3-week history of progressive lower extremity swelling with associated weeping.  He was without symptoms of angina or decompensation.  His weight was up 8 pounds when compared to his visit in 2020.  He had been out of metoprolol for at least 8 months, and reported in the printing on the pill bottle had worn off.  He did report adherence to aspirin, Imdur, lovastatin, and lisinopril.  He was not watching his salt or  fluid intake.  He was noted to be in newly diagnosed A-fib.  He was started on apixaban and aspirin was discontinued.  Toprol-XL was added, along with furosemide.  He was referred to the wound clinic for ongoing management of his lower extremity wounds and weeping.  Echo on 12/10/2021 showed an improved LV systolic function with an EF of 55 to 60%, no regional wall motion abnormalities, mild LVH, indeterminate LV diastolic function parameters, normal RV systolic function and ventricular cavity size, trivial mitral regurgitation, tricuspid aortic valve with mild insufficiency and mild stenosis with a mean gradient of 12.7 mmHg.   He comes in doing well from a cardiac perspective and is without symptoms of angina or decompensation.  Since he was last seen.  He reports his breathing is significantly improved.  His lower extremity swelling is also improved.  He did miss his wound care clinic appointment and plans to contact them to reschedule this.  Nonetheless, his lower extremities are significantly improved.  He is without symptoms of angina.  No palpitations, presyncope, or syncope.  He does note some mild positional dizziness.  He is adherent to all medications and denies missing any doses of medications, including anticoagulation.  No falls, hematochezia, or melena.  He is now able to lay fully supine without significant orthopnea.  He is not adding salt to his foods and is drinking less than 2 L of liquid per day.  Overall, he is very pleased with his improvement.   Labs independently reviewed:  12/2021 - Hgb 15.8, PLT 142, potassium 3.5, BUN 10, serum creatinine 1.07, albumin 2.9, AST/ALT normal, TSH normal, magnesium 2.2  Past Medical History:  Diagnosis Date   CAD (coronary artery disease) 01/07/2014   Cellulitis 12/17/2015   Cellulitis and abscess of foot 01/17/2015   Cellulitis and abscess of foot excluding toe 01/17/2015   Cellulitis of left leg 12/17/2015   Chest pain 11/16/2013   Chronic combined  systolic and diastolic CHF (congestive heart failure) ([name]) 0000000   Chronic systolic CHF (congestive heart failure) ([name])    a. 10/2013 Echo: EF 35-40%.   Chronic, continuous use of opioids 08/26/2013   Circulatory system disorder 11/16/2013   Formatting of this note might be different from the original. Last Assessment & Plan:  Possibly secondary to embolic phenomenon versus erythrocytosis. He is currently taking aspirin and Plavix   CKD (chronic kidney disease), stage III ([name])    a. baseline creat 1.3 - 1.5.   Collagen vascular disease ([name])    Congestive dilated cardiomyopathy ([name]) 11/16/2013   COPD (chronic obstructive pulmonary disease) ([name])    Coronary artery disease, non-occlusive    a. 11/2013 MV: distal anteroseptal ischemia;  b. 12/2013 Cath: LM nl, LAD 31m, 90d, RI 70, LCX 29m, 40d, RCA 40p, 50d, RPLS 99, EF 40-45%, mild global HK-->Med rx; c. 03/2016 MV: fixed apical defect (attenuation), no ischemia, EF 38%.   Coronary atherosclerosis 01/07/2014   Formatting of this note might be different from the original. Last Assessment & Plan:  Currently with no symptoms of angina. No further workup at this time. Continue current medication regimen.   Diabetes mellitus, type 2 ([name]) 11/16/2013   Diabetic infection of left foot ([name]) 02/03/2015   Endomyocardial disease ([name]) 11/16/2013   Formatting of this note might be different from the original. Last Assessment & Plan:  Recommended he continue his beta blocker, ACE inhibitor, nitrates. Blood pressure borderline low, asymptomatic   Essential hypertension 01/17/2015   Familial multiple lipoprotein-type hyperlipidemia 11/16/2013   Formatting of this note might be different from the original. Last Assessment & Plan:  Encouraged him to stay on his lovastatin. Goal LDL less than 70   Hereditary and idiopathic peripheral neuropathy 01/17/2015   HTN (hypertension) 01/17/2015   Hyperlipidemia    Ischemic cardiomyopathy    a. 10/2013 Echo: EF 35-40%, mild  MR/TR;  b. 12/2013 Cath: moderate diffuse CAD-->Med Rx.   Ischemic toe    a. 10/2013 L fifth toe - felt to be either 2/2 embolic plaque vs erythrocytosis - seen by vascular surgery ([facility]) ->conservative rx.   Left bundle branch block 11/16/2013   Morbid obesity ([place]) 11/16/2013   Nondependent opioid abuse, continuous ([place]) 08/26/2013   Other chronic pain 08/26/2013   Peripheral neuropathy    Secondary erythrocytosis    a. followed by heme-onc with periodic phlebotomy.   Smoker 11/16/2013   SOB (shortness of breath) 11/16/2013   Tobacco abuse    a. 46 yrs, up to 2 ppd, changed to e-cigarette 05/2013.   Type 2 diabetes mellitus with diabetic neuropathy ([place]) 08/26/2013   Type 2 diabetes mellitus without complications ([place]) [id number]   Formatting of this note might be different from the original. Last Assessment & Plan:  He is working with primary care for improved diabetes control. Still not well controlled. Recommended he watch his carbohydrates, smaller meal portions    Past Surgical History:  Procedure Laterality Date   ACHILLES TENDON SURGERY Left 03/08/2016   Procedure: ACHILLES LENGTHENING/KIDNER;  Surgeon: Larkin Ina  Vickki Muff, DPM;  Location: [facility];  Service: Podiatry;  Laterality: Left;   I & D EXTREMITY Left 01/18/2015   Procedure: IRRIGATION AND DEBRIDEMENT EXTREMITY;  Surgeon: Samara Deist, DPM;  Location: [facility] OR[facility];  Service: Podiatry;  Laterality: Left;   ORIF TIBIA FRACTURE Left 2008   SKIN DEBRIDEMENT  03/08/2016   Procedure: DEBRIDEMENT SKIN FULL THICKNESS;  Surgeon: Samara Deist, DPM;  Location: [facility];  Service: Podiatry;;   WOUND EXPLORATION Left 03/10/2015   Procedure: WOUND EXPLORATION/ SECONDARY CLOSURE OF WOUND,COMPLICATED;  Surgeon: Samara Deist, DPM;  Location: [facility];  Service: Podiatry;  Laterality: Left;    Current Medications: Current Meds  Medication Sig   albuterol (PROVENTIL HFA;VENTOLIN HFA) 108 (90 BASE) MCG/ACT inhaler Inhale 2 puffs into the lungs every 4 (four)  hours as needed for wheezing or shortness of breath.   apixaban (ELIQUIS) 5 MG TABS tablet Take 1 tablet (5 mg total) by mouth 2 (two) times daily.   citalopram (CELEXA) 10 MG tablet Take 10 mg by mouth daily.   furosemide (LASIX) 20 MG tablet Take 1 tablet (20 mg total) by mouth daily.   glipiZIDE (GLUCOTROL XL) 10 MG 24 hr tablet Take 10 mg by mouth daily.   isosorbide mononitrate (IMDUR) 30 MG 24 hr tablet Take 0.5 tablets (15 mg total) by mouth daily.   linagliptin (TRADJENTA) 5 MG TABS tablet Take 5 mg by mouth every morning.    lisinopril (ZESTRIL) 5 MG tablet Take 1 tablet (5 mg total) by mouth daily.   loratadine (CLARITIN) 10 MG tablet Take 10 mg by mouth daily as needed for allergies (seasonal allergies in the spring).    lovastatin (MEVACOR) 40 MG tablet Take 40 mg by mouth at bedtime.   metoprolol succinate (TOPROL XL) 25 MG 24 hr tablet Take 1 tablet (25 mg total) by mouth daily.   metoprolol tartrate (LOPRESSOR) 25 MG tablet TAKE 1 TABLET(25 MG) BY MOUTH TWICE DAILY   nitroGLYCERIN (NITROSTAT) 0.4 MG SL tablet Place 1 tablet (0.4 mg total) under the tongue every 5 (five) minutes as needed for chest pain.   pregabalin (LYRICA) 100 MG capsule Take 100 mg by mouth 3 (three) times daily.   sertraline (ZOLOFT) 100 MG tablet Take 100 mg by mouth at bedtime.   traZODone (DESYREL) 150 MG tablet TAKE 1 TABLET BY MOUTH AT BEDTIME.   [DISCONTINUED] isosorbide mononitrate (IMDUR) 30 MG 24 hr tablet TAKE 1 TABLET(30 MG) BY MOUTH DAILY   [DISCONTINUED] lisinopril (ZESTRIL) 10 MG tablet Take 1 tablet (10 mg total) by mouth every morning.    Allergies:   Metformin   Social History   Socioeconomic History   Marital status: Significant Other    Spouse name: Not on file   Number of children: Not on file   Years of education: Not on file   Highest education level: Not on file  Occupational History   Not on file  Tobacco Use   Smoking status: Every Day    Packs/day: 0.25    Years: 46.00     Pack years: 11.50    Types: Cigarettes, E-cigarettes   Smokeless tobacco: Never   Tobacco comments:    Currently smoking E cigarettes.  Substance and Sexual Activity   Alcohol use: Yes    Comment: alcohol abuse in the past. / rare beer   Drug use: No    Comment: marijuana 5 joints per week in the past 2013   Sexual activity: Not on file  Other Topics Concern  Not on file  Social History Narrative   He lives in [place] with his dtr and son-in-law.  On disability.  Does not routinely exercise.  Eats 6 scoops of banana ice cream every night.   Social Determinants of Health   Financial Resource Strain: Not on file  Food Insecurity: Not on file  Transportation Needs: Not on file  Physical Activity: Not on file  Stress: Not on file  Social Connections: Not on file     Family History:  The patient's family history includes Diabetes in his brother; Hyperlipidemia in his brother, brother, brother, and mother; Hypertension in his brother, brother, brother, maternal aunt, and mother.  ROS:   12-point review of systems is negative unless otherwise noted in the HPI.   EKGs/Labs/Other Studies Reviewed:    Studies reviewed were summarized above. The additional studies were reviewed today:  2D echo 12/10/2021:  1. Left ventricular ejection fraction, by estimation, is 55 to 60%. The  left ventricle has normal function. The left ventricle has no regional  wall motion abnormalities. There is mild left ventricular hypertrophy.  Left ventricular diastolic parameters  are indeterminate.   2. Right ventricular systolic function is normal. The right ventricular  size is normal.   3. The mitral valve is normal in structure. Trivial mitral valve  regurgitation.   4. The aortic valve is tricuspid. Aortic valve regurgitation is mild.  Mild aortic valve stenosis. Aortic valve mean gradient measures 12.7 mmHg.   Comparison(s): LVEF 35-40%. __________  [facility] 03/04/2016: 2 Day  Pharmacological myocardial perfusion imaging study with no significant  Ischemia Small region of moderate intensity, fixed defect in the apical region (seen on both stress and rest images), likely secondary to attenuation artifact Normal wall motion, EF estimated at 38% (depressed ejection fraction likely secondary to inferior wall / GI uptake artifact) No EKG changes concerning for ischemia at peak stress or in recovery. Baseline EKG with left bundle branch block Rare PVC Low risk scan Consider echocardiogram to confirm ejection fraction if clinically indicated __________   LHC 2015: Mid LAD 50%, distal LAD 90%, proximal LCX 30%, mid LCx 40%, proximal ramus 70%, proximal RCA 40%, distal RCA 50%, RPLS 99% __________   2D echo 10/2013: EF 35-40%, global hypokinesis, mild MR, severely increased LV posterior wall thickness, mild TR   EKG:  EKG is ordered today.  The EKG ordered today demonstrates A-fib with slow ventricular response and aberrancy, 56 bpm, LBBB (known)  Recent Labs: 12/06/2021: ALT 17; Magnesium 2.2; TSH 3.603 12/26/2021: BUN 9; Creatinine, Ser 1.32; Hemoglobin 15.5; Platelets 135; Potassium 3.7; Sodium 136  Recent Lipid Panel No results found for: CHOL, TRIG, HDL, CHOLHDL, VLDL, LDLCALC, LDLDIRECT  PHYSICAL EXAM:    VS:  BP 96/60   Pulse (!) 56   Ht 6' (1.829 m)   Wt (!) 329 lb (149.2 kg)   SpO2 97%   BMI 44.62 kg/m   BMI: Body mass index is 44.62 kg/m.  Physical Exam Constitutional:      Appearance: He is well-developed.  HENT:     Head: Normocephalic and atraumatic.  Eyes:     General:        Right eye: No discharge.        Left eye: No discharge.  Neck:     Comments: JVD difficult to assess secondary to body habitus. Cardiovascular:     Rate and Rhythm: Normal rate. Rhythm irregularly irregular.     Heart sounds: Normal heart sounds, S1 normal and  S2 normal. Heart sounds not distant. No midsystolic click and no opening snap. No murmur heard.   No  friction rub.  Pulmonary:     Effort: Pulmonary effort is normal. No respiratory distress.     Breath sounds: Normal breath sounds. No decreased breath sounds, wheezing or rales.  Chest:     Chest wall: No tenderness.  Abdominal:     General: There is no distension.     Palpations: Abdomen is soft.     Tenderness: There is no abdominal tenderness.  Musculoskeletal:     Cervical back: Normal range of motion.     Right lower leg: Edema present.     Left lower leg: Edema present.     Comments: Improved bilateral lower extremity swelling with resolved weeping.  Changes of hyperpigmentation consistent with chronic woody edema.  Skin:    General: Skin is warm and dry.     Nails: There is no clubbing.  Neurological:     Mental Status: He is alert and oriented to person, place, and time.  Psychiatric:        Speech: Speech normal.        Behavior: Behavior normal.        Thought Content: Thought content normal.        Judgment: Judgment normal.    Wt Readings from Last 3 Encounters:  12/26/21 (!) 329 lb (149.2 kg)  12/06/21 (!) 328 lb (148.8 kg)  07/13/19 (!) 320 lb 8 oz (145.4 kg)     ASSESSMENT & PLAN:   CAD involving the native coronary arteries without angina: He continues to do well, without symptoms concerning for angina or decompensation.  He remains on apixaban, given recently diagnosed A-fib, in place of aspirin to minimize bleeding risk.  Otherwise, we will continue him on lower dose Imdur given hypotension along with lower dose lisinopril, metoprolol, and statin.  HFrEF with LBBB: Updated echo demonstrated normalization of LV systolic function.  Subjectively, he reports significant improvement in his dyspnea and resolution of orthopnea following the addition of furosemide.  He does have relative hypotension in the office today and has noted some positional dizziness.  With this,, and in the context of normalization of his LV systolic function, we will decrease his lisinopril  to 5 mg daily.  Continue Toprol-XL 25 mg daily along with furosemide 20 mg daily.  Check BMP and CBC.  Defer further escalation of GDMT at this time given subsequent normalization of LV systolic function and underlying relative hypotension.  Persistent A-fib: He remains in A-fib with controlled ventricular response.  Continue Toprol-XL for rate control.  He remains on apixaban given a CHA2DS2-VASc of at least 3, and denies missing any doses of anticoagulation since this was initiated on 12/06/2021.  No falls or symptoms concerning for bleeding.  We will pursue a DCCV in 2 weeks, which will allow for greater than 4 weeks of uninterrupted anticoagulation.  HTN: Blood pressure is soft in the office today.  Decrease Imdur and lisinopril as outlined above with continuation of furosemide and Toprol-XL.  HLD: He remains on lovastatin which has been followed by his PCPs office.  CKD stage III: Update BMP.  Ongoing tobacco and alcohol use: Complete cessation is encouraged.  Obesity: Weight loss is recommended through out of the diet and regular exercise.  Venous stasis dermatitis: Improving.  He has previously been referred to the wound clinic.    Disposition: F/u with Dr. Rockey Situ or an APP in 3 to 4 weeks.  Medication Adjustments/Labs and Tests Ordered: Current medicines are reviewed at length with the patient today.  Concerns regarding medicines are outlined above. Medication changes, Labs and Tests ordered today are summarized above and listed in the Patient Instructions accessible in Encounters.   Signed, Christell Faith, PA-C 12/26/2021 4:29 PM     [facility] [address] [place] [address] [place], [place] [address] [phone]

## 2021-12-24 NOTE — Progress Notes (Unsigned)
Cardiology Office Note    Date:  12/26/2021   ID:  [name], [name] May 03, 1956, MRN [id number]  PCP:  [facility] [place]  Cardiologist:  Ida Rogue, MD  Electrophysiologist:  None   Chief Complaint: Follow-up  History of Present Illness:   [name] is a 66 y.o. male with history of CAD medically managed by [facility] in 2015, HFrEF, LBBB, DM2 with neuropathy, HTN, HLD, COPD, CKD stage III, obesity, secondary erythrocytosis, chronic venous stasis dermatitis, and tobacco use who presents for follow-up of CAD and cardiomyopathy.   LHC in 2015 showed mid LAD 50%, distal LAD 90%, proximal LCX 30%, mid LCx 40%, proximal ramus 70%, proximal RCA 40%, distal RCA 50%, RPLS 99% stenoses as outlined below.  Echo in 10/2013 demonstrated an EF of 35 to 40%, global hypokinesis, mild mitral regurgitation, severely increased LV posterior wall thickness, and mild tricuspid regurgitation.  Most recent ischemic evaluation by pharmacological MPI showed no ischemia with a small region of moderate intensity fixed defect in the apical region felt to be secondary to attenuation artifact.  LVEF estimated at 38%, though this was felt to be depressed due to inferior wall/GI uptake.  Overall, this was a low risk scan.  Prior admission for ischemic toe felt to be secondary to small plaque emboli.  He was last seen in the office in 07/2019 and was without symptoms of angina or decompensation.  He was lost to follow-up from 07/2019 until he was recently seen on 12/06/2021, at which time he noted a 3-week history of progressive lower extremity swelling with associated weeping.  He was without symptoms of angina or decompensation.  His weight was up 8 pounds when compared to his visit in 2020.  He had been out of metoprolol for at least 8 months, and reported in the printing on the pill bottle had worn off.  He did report adherence to aspirin, Imdur, lovastatin, and lisinopril.  He was not watching his salt or  fluid intake.  He was noted to be in newly diagnosed A-fib.  He was started on apixaban and aspirin was discontinued.  Toprol-XL was added, along with furosemide.  He was referred to the wound clinic for ongoing management of his lower extremity wounds and weeping.  Echo on 12/10/2021 showed an improved LV systolic function with an EF of 55 to 60%, no regional wall motion abnormalities, mild LVH, indeterminate LV diastolic function parameters, normal RV systolic function and ventricular cavity size, trivial mitral regurgitation, tricuspid aortic valve with mild insufficiency and mild stenosis with a mean gradient of 12.7 mmHg.   He comes in doing well from a cardiac perspective and is without symptoms of angina or decompensation.  Since he was last seen.  He reports his breathing is significantly improved.  His lower extremity swelling is also improved.  He did miss his wound care clinic appointment and plans to contact them to reschedule this.  Nonetheless, his lower extremities are significantly improved.  He is without symptoms of angina.  No palpitations, presyncope, or syncope.  He does note some mild positional dizziness.  He is adherent to all medications and denies missing any doses of medications, including anticoagulation.  No falls, hematochezia, or melena.  He is now able to lay fully supine without significant orthopnea.  He is not adding salt to his foods and is drinking less than 2 L of liquid per day.  Overall, he is very pleased with his improvement.   Labs independently reviewed:  12/2021 - Hgb 15.8, PLT 142, potassium 3.5, BUN 10, serum creatinine 1.07, albumin 2.9, AST/ALT normal, TSH normal, magnesium 2.2  Past Medical History:  Diagnosis Date   CAD (coronary artery disease) 01/07/2014   Cellulitis 12/17/2015   Cellulitis and abscess of foot 01/17/2015   Cellulitis and abscess of foot excluding toe 01/17/2015   Cellulitis of left leg 12/17/2015   Chest pain 11/16/2013   Chronic combined  systolic and diastolic CHF (congestive heart failure) ([place]) 0000000   Chronic systolic CHF (congestive heart failure) ([place])    a. 10/2013 Echo: EF 35-40%.   Chronic, continuous use of opioids 08/26/2013   Circulatory system disorder 11/16/2013   Formatting of this note might be different from the original. Last Assessment & Plan:  Possibly secondary to embolic phenomenon versus erythrocytosis. He is currently taking aspirin and Plavix   CKD (chronic kidney disease), stage III ([name])    a. baseline creat 1.3 - 1.5.   Collagen vascular disease ([name])    Congestive dilated cardiomyopathy ([place]) 11/16/2013   COPD (chronic obstructive pulmonary disease) ([name])    Coronary artery disease, non-occlusive    a. 11/2013 MV: distal anteroseptal ischemia;  b. 12/2013 Cath: LM nl, LAD 46m, 90d, RI 70, LCX 57m, 40d, RCA 40p, 50d, RPLS 99, EF 40-45%, mild global HK-->Med rx; c. 03/2016 MV: fixed apical defect (attenuation), no ischemia, EF 38%.   Coronary atherosclerosis 01/07/2014   Formatting of this note might be different from the original. Last Assessment & Plan:  Currently with no symptoms of angina. No further workup at this time. Continue current medication regimen.   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 Last Assessment & Plan:  Recommended he continue his beta blocker, ACE inhibitor, nitrates. Blood pressure borderline low, asymptomatic   Essential hypertension 01/17/2015   Familial multiple lipoprotein-type hyperlipidemia 11/16/2013   Formatting of this note might be different from the original. Last Assessment & Plan:  Encouraged him to stay on his lovastatin. Goal LDL less than 70   Hereditary and idiopathic peripheral neuropathy 01/17/2015   HTN (hypertension) 01/17/2015   Hyperlipidemia    Ischemic cardiomyopathy    a. 10/2013 Echo: EF 35-40%, mild  MR/TR;  b. 12/2013 Cath: moderate diffuse CAD-->Med Rx.   Ischemic toe    a. 10/2013 L fifth toe - felt to be either 2/2 embolic plaque vs erythrocytosis - seen by vascular surgery ([place]) ->conservative rx.   Left bundle branch block 11/16/2013   Morbid obesity ([place]) 11/16/2013   Nondependent opioid abuse, continuous ([place]) 08/26/2013   Other chronic pain 08/26/2013   Peripheral neuropathy    Secondary erythrocytosis    a. followed by heme-onc with periodic phlebotomy.   Smoker 11/16/2013   SOB (shortness of breath) 11/16/2013   Tobacco abuse    a. 46 yrs, up to 2 ppd, changed to e-cigarette 05/2013.   Type 2 diabetes mellitus with diabetic neuropathy ([place]) 08/26/2013   Type 2 diabetes mellitus without complications ([place]) [id number]   Formatting of this note might be different from the original. Last Assessment & Plan:  He is working with primary care for improved diabetes control. Still not well controlled. Recommended he watch his carbohydrates, smaller meal portions    Past Surgical History:  Procedure Laterality Date   ACHILLES TENDON SURGERY Left 03/08/2016   Procedure: ACHILLES LENGTHENING/KIDNER;  Surgeon: Larkin Ina  Vickki Muff, DPM;  Location: [facility] ORS;  Service: Podiatry;  Laterality: Left;   I & D EXTREMITY Left 01/18/2015   Procedure: IRRIGATION AND DEBRIDEMENT EXTREMITY;  Surgeon: Samara Deist, DPM;  Location: [facility] ORS;  Service: Podiatry;  Laterality: Left;   ORIF TIBIA FRACTURE Left 2008   SKIN DEBRIDEMENT  03/08/2016   Procedure: DEBRIDEMENT SKIN FULL THICKNESS;  Surgeon: Samara Deist, DPM;  Location: [facility];  Service: Podiatry;;   WOUND EXPLORATION Left 03/10/2015   Procedure: WOUND EXPLORATION/ SECONDARY CLOSURE OF WOUND,COMPLICATED;  Surgeon: Samara Deist, DPM;  Location: [facility] ORS;  Service: Podiatry;  Laterality: Left;    Current Medications: Current Meds  Medication Sig   albuterol (PROVENTIL HFA;VENTOLIN HFA) 108 (90 BASE) MCG/ACT inhaler Inhale 2 puffs into the lungs every 4 (four)  hours as needed for wheezing or shortness of breath.   apixaban (ELIQUIS) 5 MG TABS tablet Take 1 tablet (5 mg total) by mouth 2 (two) times daily.   citalopram (CELEXA) 10 MG tablet Take 10 mg by mouth daily.   furosemide (LASIX) 20 MG tablet Take 1 tablet (20 mg total) by mouth daily.   glipiZIDE (GLUCOTROL XL) 10 MG 24 hr tablet Take 10 mg by mouth daily.   isosorbide mononitrate (IMDUR) 30 MG 24 hr tablet Take 0.5 tablets (15 mg total) by mouth daily.   linagliptin (TRADJENTA) 5 MG TABS tablet Take 5 mg by mouth every morning.    lisinopril (ZESTRIL) 5 MG tablet Take 1 tablet (5 mg total) by mouth daily.   loratadine (CLARITIN) 10 MG tablet Take 10 mg by mouth daily as needed for allergies (seasonal allergies in the spring).    lovastatin (MEVACOR) 40 MG tablet Take 40 mg by mouth at bedtime.   metoprolol succinate (TOPROL XL) 25 MG 24 hr tablet Take 1 tablet (25 mg total) by mouth daily.   metoprolol tartrate (LOPRESSOR) 25 MG tablet TAKE 1 TABLET(25 MG) BY MOUTH TWICE DAILY   nitroGLYCERIN (NITROSTAT) 0.4 MG SL tablet Place 1 tablet (0.4 mg total) under the tongue every 5 (five) minutes as needed for chest pain.   pregabalin (LYRICA) 100 MG capsule Take 100 mg by mouth 3 (three) times daily.   sertraline (ZOLOFT) 100 MG tablet Take 100 mg by mouth at bedtime.   traZODone (DESYREL) 150 MG tablet TAKE 1 TABLET BY MOUTH AT BEDTIME.   [DISCONTINUED] isosorbide mononitrate (IMDUR) 30 MG 24 hr tablet TAKE 1 TABLET(30 MG) BY MOUTH DAILY   [DISCONTINUED] lisinopril (ZESTRIL) 10 MG tablet Take 1 tablet (10 mg total) by mouth every morning.    Allergies:   Metformin   Social History   Socioeconomic History   Marital status: Significant Other    Spouse name: Not on file   Number of children: Not on file   Years of education: Not on file   Highest education level: Not on file  Occupational History   Not on file  Tobacco Use   Smoking status: Every Day    Packs/day: 0.25    Years: 46.00     Pack years: 11.50    Types: Cigarettes, E-cigarettes   Smokeless tobacco: Never   Tobacco comments:    Currently smoking E cigarettes.  Substance and Sexual Activity   Alcohol use: Yes    Comment: alcohol abuse in the past. / rare beer   Drug use: No    Comment: marijuana 5 joints per week in the past 2013   Sexual activity: Not on file  Other Topics Concern  Not on file  Social History Narrative   He lives in [place] with his dtr and son-in-law.  On disability.  Does not routinely exercise.  Eats 6 scoops of banana ice cream every night.   Social Determinants of Health   Financial Resource Strain: Not on file  Food Insecurity: Not on file  Transportation Needs: Not on file  Physical Activity: Not on file  Stress: Not on file  Social Connections: Not on file     Family History:  The patient's family history includes Diabetes in his brother; Hyperlipidemia in his brother, brother, brother, and mother; Hypertension in his brother, brother, brother, maternal aunt, and mother.  ROS:   12-point review of systems is negative unless otherwise noted in the HPI.   EKGs/Labs/Other Studies Reviewed:    Studies reviewed were summarized above. The additional studies were reviewed today:  2D echo 12/10/2021:  1. Left ventricular ejection fraction, by estimation, is 55 to 60%. The  left ventricle has normal function. The left ventricle has no regional  wall motion abnormalities. There is mild left ventricular hypertrophy.  Left ventricular diastolic parameters  are indeterminate.   2. Right ventricular systolic function is normal. The right ventricular  size is normal.   3. The mitral valve is normal in structure. Trivial mitral valve  regurgitation.   4. The aortic valve is tricuspid. Aortic valve regurgitation is mild.  Mild aortic valve stenosis. Aortic valve mean gradient measures 12.7 mmHg.   Comparison(s): LVEF 35-40%. __________  [facility] MPI 03/04/2016: 2 Day  Pharmacological myocardial perfusion imaging study with no significant  Ischemia Small region of moderate intensity, fixed defect in the apical region (seen on both stress and rest images), likely secondary to attenuation artifact Normal wall motion, EF estimated at 38% (depressed ejection fraction likely secondary to inferior wall / GI uptake artifact) No EKG changes concerning for ischemia at peak stress or in recovery. Baseline EKG with left bundle branch block Rare PVC Low risk scan Consider echocardiogram to confirm ejection fraction if clinically indicated __________   LHC 2015: Mid LAD 50%, distal LAD 90%, proximal LCX 30%, mid LCx 40%, proximal ramus 70%, proximal RCA 40%, distal RCA 50%, RPLS 99% __________   2D echo 10/2013: EF 35-40%, global hypokinesis, mild MR, severely increased LV posterior wall thickness, mild TR   EKG:  EKG is ordered today.  The EKG ordered today demonstrates A-fib with slow ventricular response and aberrancy, 56 bpm, LBBB (known)  Recent Labs: 12/06/2021: ALT 17; Magnesium 2.2; TSH 3.603 12/26/2021: BUN 9; Creatinine, Ser 1.32; Hemoglobin 15.5; Platelets 135; Potassium 3.7; Sodium 136  Recent Lipid Panel No results found for: CHOL, TRIG, HDL, CHOLHDL, VLDL, LDLCALC, LDLDIRECT  PHYSICAL EXAM:    VS:  BP 96/60   Pulse (!) 56   Ht 6' (1.829 m)   Wt (!) 329 lb (149.2 kg)   SpO2 97%   BMI 44.62 kg/m   BMI: Body mass index is 44.62 kg/m.  Physical Exam Constitutional:      Appearance: He is well-developed.  HENT:     Head: Normocephalic and atraumatic.  Eyes:     General:        Right eye: No discharge.        Left eye: No discharge.  Neck:     Comments: JVD difficult to assess secondary to body habitus. Cardiovascular:     Rate and Rhythm: Normal rate. Rhythm irregularly irregular.     Heart sounds: Normal heart sounds, S1 normal and  S2 normal. Heart sounds not distant. No midsystolic click and no opening snap. No murmur heard.   No  friction rub.  Pulmonary:     Effort: Pulmonary effort is normal. No respiratory distress.     Breath sounds: Normal breath sounds. No decreased breath sounds, wheezing or rales.  Chest:     Chest wall: No tenderness.  Abdominal:     General: There is no distension.     Palpations: Abdomen is soft.     Tenderness: There is no abdominal tenderness.  Musculoskeletal:     Cervical back: Normal range of motion.     Right lower leg: Edema present.     Left lower leg: Edema present.     Comments: Improved bilateral lower extremity swelling with resolved weeping.  Changes of hyperpigmentation consistent with chronic woody edema.  Skin:    General: Skin is warm and dry.     Nails: There is no clubbing.  Neurological:     Mental Status: He is alert and oriented to person, place, and time.  Psychiatric:        Speech: Speech normal.        Behavior: Behavior normal.        Thought Content: Thought content normal.        Judgment: Judgment normal.    Wt Readings from Last 3 Encounters:  12/26/21 (!) 329 lb (149.2 kg)  12/06/21 (!) 328 lb (148.8 kg)  07/13/19 (!) 320 lb 8 oz (145.4 kg)     ASSESSMENT & PLAN:   CAD involving the native coronary arteries without angina: He continues to do well, without symptoms concerning for angina or decompensation.  He remains on apixaban, given recently diagnosed A-fib, in place of aspirin to minimize bleeding risk.  Otherwise, we will continue him on lower dose Imdur given hypotension along with lower dose lisinopril, metoprolol, and statin.  HFrEF with LBBB: Updated echo demonstrated normalization of LV systolic function.  Subjectively, he reports significant improvement in his dyspnea and resolution of orthopnea following the addition of furosemide.  He does have relative hypotension in the office today and has noted some positional dizziness.  With this,, and in the context of normalization of his LV systolic function, we will decrease his lisinopril  to 5 mg daily.  Continue Toprol-XL 25 mg daily along with furosemide 20 mg daily.  Check BMP and CBC.  Defer further escalation of GDMT at this time given subsequent normalization of LV systolic function and underlying relative hypotension.  Persistent A-fib: He remains in A-fib with controlled ventricular response.  Continue Toprol-XL for rate control.  He remains on apixaban given a CHA2DS2-VASc of at least 3, and denies missing any doses of anticoagulation since this was initiated on 12/06/2021.  No falls or symptoms concerning for bleeding.  We will pursue a DCCV in 2 weeks, which will allow for greater than 4 weeks of uninterrupted anticoagulation.  HTN: Blood pressure is soft in the office today.  Decrease Imdur and lisinopril as outlined above with continuation of furosemide and Toprol-XL.  HLD: He remains on lovastatin which has been followed by his PCPs office.  CKD stage III: Update BMP.  Ongoing tobacco and alcohol use: Complete cessation is encouraged.  Obesity: Weight loss is recommended through out of the diet and regular exercise.  Venous stasis dermatitis: Improving.  He has previously been referred to the wound clinic.    Disposition: F/u with Dr. Rockey Situ or an APP in 3 to 4 weeks.  Medication Adjustments/Labs and Tests Ordered: Current medicines are reviewed at length with the patient today.  Concerns regarding medicines are outlined above. Medication changes, Labs and Tests ordered today are summarized above and listed in the Patient Instructions accessible in Encounters.   Signed, Christell Faith, PA-C 12/26/2021 4:29 PM     [facility] [address] [place] [address] [place] [employer], [place] [address] [phone]

## 2021-12-25 ENCOUNTER — Other Ambulatory Visit: Payer: Self-pay

## 2021-12-25 DIAGNOSIS — D751 Secondary polycythemia: Secondary | ICD-10-CM | POA: Insufficient documentation

## 2021-12-25 DIAGNOSIS — Z720 Tobacco use: Secondary | ICD-10-CM | POA: Insufficient documentation

## 2021-12-25 DIAGNOSIS — I255 Ischemic cardiomyopathy: Secondary | ICD-10-CM | POA: Insufficient documentation

## 2021-12-25 DIAGNOSIS — I2510 Atherosclerotic heart disease of native coronary artery without angina pectoris: Secondary | ICD-10-CM | POA: Insufficient documentation

## 2021-12-25 DIAGNOSIS — I5022 Chronic systolic (congestive) heart failure: Secondary | ICD-10-CM | POA: Insufficient documentation

## 2021-12-25 DIAGNOSIS — J449 Chronic obstructive pulmonary disease, unspecified: Secondary | ICD-10-CM | POA: Insufficient documentation

## 2021-12-25 DIAGNOSIS — M359 Systemic involvement of connective tissue, unspecified: Secondary | ICD-10-CM | POA: Insufficient documentation

## 2021-12-26 ENCOUNTER — Telehealth: Payer: Self-pay | Admitting: *Deleted

## 2021-12-26 ENCOUNTER — Ambulatory Visit: Payer: Medicare HMO | Admitting: Physician Assistant

## 2021-12-26 ENCOUNTER — Encounter: Payer: Self-pay | Admitting: Physician Assistant

## 2021-12-26 ENCOUNTER — Other Ambulatory Visit: Payer: Self-pay | Admitting: Physician Assistant

## 2021-12-26 ENCOUNTER — Other Ambulatory Visit
Admission: RE | Admit: 2021-12-26 | Discharge: 2021-12-26 | Disposition: A | Discharge status: Home / Self Care | Payer: Medicare HMO | Source: Ambulatory Visit | Attending: Physician Assistant | Admitting: Physician Assistant

## 2021-12-26 VITALS — BP 96/60 | HR 56 | Ht 72.0 in | Wt 329.0 lb

## 2021-12-26 DIAGNOSIS — I872 Venous insufficiency (chronic) (peripheral): Secondary | ICD-10-CM

## 2021-12-26 DIAGNOSIS — I4819 Other persistent atrial fibrillation: Secondary | ICD-10-CM

## 2021-12-26 DIAGNOSIS — Z789 Other specified health status: Secondary | ICD-10-CM

## 2021-12-26 DIAGNOSIS — I4891 Unspecified atrial fibrillation: Secondary | ICD-10-CM | POA: Diagnosis present

## 2021-12-26 DIAGNOSIS — N183 Chronic kidney disease, stage 3 unspecified: Secondary | ICD-10-CM | POA: Insufficient documentation

## 2021-12-26 DIAGNOSIS — I2510 Atherosclerotic heart disease of native coronary artery without angina pectoris: Secondary | ICD-10-CM | POA: Insufficient documentation

## 2021-12-26 DIAGNOSIS — I5020 Unspecified systolic (congestive) heart failure: Secondary | ICD-10-CM | POA: Diagnosis present

## 2021-12-26 DIAGNOSIS — E785 Hyperlipidemia, unspecified: Secondary | ICD-10-CM

## 2021-12-26 DIAGNOSIS — I447 Left bundle-branch block, unspecified: Secondary | ICD-10-CM | POA: Insufficient documentation

## 2021-12-26 DIAGNOSIS — Z720 Tobacco use: Secondary | ICD-10-CM

## 2021-12-26 DIAGNOSIS — I10 Essential (primary) hypertension: Secondary | ICD-10-CM

## 2021-12-26 LAB — CBC
HCT: 48.5 % (ref 39.0–52.0)
Hemoglobin: 15.5 g/dL (ref 13.0–17.0)
MCH: 27.7 pg (ref 26.0–34.0)
MCHC: 32 g/dL (ref 30.0–36.0)
MCV: 86.6 fL (ref 80.0–100.0)
Platelets: 135 10*3/uL — ABNORMAL LOW (ref 150–400)
RBC: 5.6 MIL/uL (ref 4.22–5.81)
RDW: 16 % — ABNORMAL HIGH (ref 11.5–15.5)
WBC: 9.4 10*3/uL (ref 4.0–10.5)
nRBC: 0 % (ref 0.0–0.2)

## 2021-12-26 LAB — BASIC METABOLIC PANEL
Anion gap: 8 (ref 5–15)
BUN: 9 mg/dL (ref 8–23)
CO2: 27 mmol/L (ref 22–32)
Calcium: 8.7 mg/dL — ABNORMAL LOW (ref 8.9–10.3)
Chloride: 101 mmol/L (ref 98–111)
Creatinine, Ser: 1.32 mg/dL — ABNORMAL HIGH (ref 0.61–1.24)
GFR, Estimated: >60 mL/min (ref >60)
Glucose, Bld: 122 mg/dL — ABNORMAL HIGH (ref 70–99)
Potassium: 3.7 mmol/L (ref 3.5–5.1)
Sodium: 136 mmol/L (ref 135–145)

## 2021-12-26 MED ORDER — ISOSORBIDE MONONITRATE ER 30 MG PO TB24: 15.0000 mg | ORAL_TABLET | Freq: Every day | ORAL | 3 refills | Status: AC

## 2021-12-26 MED ORDER — LISINOPRIL 5 MG PO TABS: 5.0000 mg | ORAL_TABLET | Freq: Every day | ORAL | 3 refills | Status: AC

## 2021-12-26 NOTE — Telephone Encounter (Signed)
Reviewed results and recommendations with patient and he verbalized understanding with no further questions at this time.  

## 2021-12-26 NOTE — Patient Instructions (Signed)
Medication Instructions:  Your physician has recommended you make the following change in your medication:   DECREASE Lisinopril to 5 mg once daily  DECREASE Isosorbide mononitrate to 15 mg once daily. (This will be half a tablet of the 30 mg pill)  *If you need a refill on your cardiac medications before your next appointment, please call your pharmacy*   Lab Work: BMET & CBC today over at the [employer] at [facility] go to 1st desk on the right to check in (REGISTRATION)     If you have labs (blood work) drawn today and your tests are completely normal, you will receive your results only by: [place] (if you have [place]) OR A paper copy in the mail If you have any lab test that is abnormal or we need to change your treatment, we will call you to review the results.   Testing/Procedures: You are scheduled for a Cardioversion on June 8th  with Dr. Rockey Situ Please arrive at the [facility] at 06:30 a.m. on the day of your procedure.  DIET INSTRUCTIONS:  Nothing to eat or drink after midnight except your medications with a small sip of water.        Medications:  YOU MAY TAKE ALL of your remaining medications with a small amount of water.  Must have a responsible person to drive you home.  Bring a current list of your medications and current insurance cards.    If you have any questions after you get home, please call the office at [phone]    Follow-Up: At [facility], you and your health needs are our priority.  As part of our continuing mission to provide you with exceptional heart care, we have created designated Provider Care Teams.  These Care Teams include your primary Cardiologist (physician) and Advanced Practice Providers (APPs -  Physician Assistants and Nurse Practitioners) who all work together to provide you with the care you need, when you need it.  We recommend signing up for the patient portal called "MyChart".  Sign up information is  provided on this After Visit Summary.  MyChart is used to connect with patients for Virtual Visits (Telemedicine).  Patients are able to view lab/test results, encounter notes, upcoming appointments, etc.  Non-urgent messages can be sent to your provider as well.   To learn more about what you can do with MyChart, go to [email or web address].    Your next appointment:   4 week(s)  The format for your next appointment:   In Person  Provider:   Ida Rogue, MD or Christell Faith, PA-C       Important Information About Sugar

## 2021-12-26 NOTE — Progress Notes (Signed)
Orders for DCCV have been signed and held. 

## 2021-12-26 NOTE — Telephone Encounter (Signed)
-----   Message from Rise Mu, PA-C sent at 12/26/2021  4:45 PM EDT ----- Blood count remains stable.  Platelet count remains mildly low, though stable dating back to 2017.  Potassium is normal.  Renal function is mildly elevated, though consistent with prior readings dating back to at least 2016.   Recommendations: Follow-up with PCP if not already done so for thrombocytopenia

## 2021-12-28 ENCOUNTER — Ambulatory Visit: Payer: Medicare HMO | Admitting: Physician Assistant

## 2022-01-09 MED ORDER — SODIUM CHLORIDE 0.9 % IV SOLN: INTRAVENOUS | Status: DC

## 2022-01-10 ENCOUNTER — Ambulatory Visit: Payer: Medicare HMO | Admitting: Anesthesiology

## 2022-01-10 ENCOUNTER — Encounter
Admission: RE | Disposition: A | Discharge status: Home / Self Care | Payer: Self-pay | Source: Ambulatory Visit | Attending: Cardiovascular Disease

## 2022-01-10 ENCOUNTER — Encounter: Payer: Self-pay | Admitting: Cardiovascular Disease

## 2022-01-10 ENCOUNTER — Ambulatory Visit
Admission: RE | Admit: 2022-01-10 | Discharge: 2022-01-10 | Disposition: A | Discharge status: Home / Self Care | Payer: Medicare HMO | Source: Ambulatory Visit | Attending: Cardiovascular Disease | Admitting: Cardiovascular Disease

## 2022-01-10 DIAGNOSIS — Z7984 Long term (current) use of oral hypoglycemic drugs: Secondary | ICD-10-CM | POA: Diagnosis not present

## 2022-01-10 DIAGNOSIS — I4819 Other persistent atrial fibrillation: Secondary | ICD-10-CM | POA: Insufficient documentation

## 2022-01-10 DIAGNOSIS — R42 Dizziness and giddiness: Secondary | ICD-10-CM | POA: Insufficient documentation

## 2022-01-10 DIAGNOSIS — E1151 Type 2 diabetes mellitus with diabetic peripheral angiopathy without gangrene: Secondary | ICD-10-CM | POA: Diagnosis not present

## 2022-01-10 DIAGNOSIS — J449 Chronic obstructive pulmonary disease, unspecified: Secondary | ICD-10-CM | POA: Diagnosis not present

## 2022-01-10 DIAGNOSIS — E7849 Other hyperlipidemia: Secondary | ICD-10-CM | POA: Diagnosis not present

## 2022-01-10 DIAGNOSIS — Z7901 Long term (current) use of anticoagulants: Secondary | ICD-10-CM | POA: Diagnosis not present

## 2022-01-10 DIAGNOSIS — Z6841 Body Mass Index (BMI) 40.0 and over, adult: Secondary | ICD-10-CM | POA: Insufficient documentation

## 2022-01-10 DIAGNOSIS — I130 Hypertensive heart and chronic kidney disease with heart failure and stage 1 through stage 4 chronic kidney disease, or unspecified chronic kidney disease: Secondary | ICD-10-CM | POA: Insufficient documentation

## 2022-01-10 DIAGNOSIS — F17210 Nicotine dependence, cigarettes, uncomplicated: Secondary | ICD-10-CM | POA: Diagnosis not present

## 2022-01-10 DIAGNOSIS — I447 Left bundle-branch block, unspecified: Secondary | ICD-10-CM | POA: Diagnosis not present

## 2022-01-10 DIAGNOSIS — I872 Venous insufficiency (chronic) (peripheral): Secondary | ICD-10-CM | POA: Diagnosis not present

## 2022-01-10 DIAGNOSIS — E1122 Type 2 diabetes mellitus with diabetic chronic kidney disease: Secondary | ICD-10-CM | POA: Insufficient documentation

## 2022-01-10 DIAGNOSIS — I2510 Atherosclerotic heart disease of native coronary artery without angina pectoris: Secondary | ICD-10-CM | POA: Insufficient documentation

## 2022-01-10 DIAGNOSIS — I959 Hypotension, unspecified: Secondary | ICD-10-CM | POA: Diagnosis not present

## 2022-01-10 DIAGNOSIS — R0602 Shortness of breath: Secondary | ICD-10-CM | POA: Diagnosis not present

## 2022-01-10 DIAGNOSIS — N183 Chronic kidney disease, stage 3 unspecified: Secondary | ICD-10-CM | POA: Diagnosis not present

## 2022-01-10 DIAGNOSIS — F17290 Nicotine dependence, other tobacco product, uncomplicated: Secondary | ICD-10-CM | POA: Insufficient documentation

## 2022-01-10 DIAGNOSIS — Z79899 Other long term (current) drug therapy: Secondary | ICD-10-CM | POA: Insufficient documentation

## 2022-01-10 DIAGNOSIS — I5020 Unspecified systolic (congestive) heart failure: Secondary | ICD-10-CM | POA: Insufficient documentation

## 2022-01-10 HISTORY — PX: CARDIOVERSION: SHX1299

## 2022-01-10 LAB — GLUCOSE, CAPILLARY: Glucose-Capillary: 120 mg/dL — ABNORMAL HIGH (ref 70–99)

## 2022-01-10 SURGERY — CARDIOVERSION
Anesthesia: General

## 2022-01-10 MED ORDER — PROPOFOL 10 MG/ML IV BOLUS
INTRAVENOUS | Status: DC | PRN
  Administered 2022-01-10: 20 mg via INTRAVENOUS
  Administered 2022-01-10: 60 mg via INTRAVENOUS

## 2022-01-10 MED ORDER — PROPOFOL 10 MG/ML IV BOLUS
INTRAVENOUS | Status: AC
  Filled 2022-01-10: qty 40

## 2022-01-10 NOTE — Anesthesia Postprocedure Evaluation (Signed)
Anesthesia Post Note  Patient: [name]  Procedure(s) Performed: CARDIOVERSION  Patient location during evaluation: [facility] Recovery Anesthesia Type: General Level of consciousness: awake and alert Pain management: pain level controlled Vital Signs Assessment: post-procedure vital signs reviewed and stable Respiratory status: spontaneous breathing, nonlabored ventilation, respiratory function stable and patient connected to nasal cannula oxygen Cardiovascular status: blood pressure returned to baseline and stable Postop Assessment: no apparent nausea or vomiting Anesthetic complications: no   No notable events documented.   Last Vitals:  Vitals:   01/10/22 0800 01/10/22 0815  BP: 139/70 137/67  Pulse: (!) 53 (!) 135  Resp: 16 16  Temp:    SpO2: 98% 100%    Last Pain:  Vitals:   01/10/22 0815  PainSc: 0-No pain                 Corinda Gubler

## 2022-01-10 NOTE — Anesthesia Preprocedure Evaluation (Addendum)
Anesthesia Evaluation  Patient identified by MRN, date of birth, ID band Patient awake    Reviewed: Allergy & Precautions, NPO status , Patient's Chart, lab work & pertinent test results  History of Anesthesia Complications Negative for: history of anesthetic complications  Airway Mallampati: III  TM Distance: >3 FB Neck ROM: Full    Dental  (+) Poor Dentition   Pulmonary neg sleep apnea, COPD,  COPD inhaler, Current Smoker and Patient abstained from smoking.,    Pulmonary exam normal breath sounds clear to auscultation       Cardiovascular Exercise Tolerance: Good METShypertension, Pt. on medications + CAD, + Peripheral Vascular Disease and +CHF  (-) Past MI + dysrhythmias Atrial Fibrillation  Rhythm:Irregular Rate:Normal - Systolic murmurs TTE [id number]: 1. Left ventricular ejection fraction, by estimation, is 55 to 60%. The  left ventricle has normal function. The left ventricle has no regional  wall motion abnormalities. There is mild left ventricular hypertrophy.  Left ventricular diastolic parameters  are indeterminate.  2. Right ventricular systolic function is normal. The right ventricular  size is normal.  3. The mitral valve is normal in structure. Trivial mitral valve  regurgitation.  4. The aortic valve is tricuspid. Aortic valve regurgitation is mild.  Mild aortic valve stenosis. Aortic valve mean gradient measures 12.7 mmHg.   Comparison(s): LVEF 35-40%.    Neuro/Psych negative neurological ROS  negative psych ROS   GI/Hepatic neg GERD  ,(+)     (-) substance abuse  ,   Endo/Other  diabetes, Oral Hypoglycemic AgentsMorbid obesity  Renal/GU CRFRenal disease     Musculoskeletal   Abdominal (+) + obese,   Peds  Hematology   Anesthesia Other Findings Past Medical History: 01/07/2014: CAD (coronary artery disease) 12/17/2015: Cellulitis 01/17/2015: Cellulitis and abscess of foot 01/17/2015:  Cellulitis and abscess of foot excluding toe 12/17/2015: Cellulitis of left leg 11/16/2013: Chest pain 01/17/2015: Chronic combined systolic and diastolic CHF (congestive  heart failure) ([name]) No date: Chronic systolic CHF (congestive heart failure) ([place])     Comment:  a. 10/2013 Echo: EF 35-40%. 08/26/2013: Chronic, continuous use of opioids 11/16/2013: Circulatory system disorder     Comment:  Formatting of this note might be different from the               original. Last Assessment & Plan:  Possibly secondary to               embolic phenomenon versus erythrocytosis. He is currently              taking aspirin and Plavix No date: CKD (chronic kidney disease), stage III ([name])     Comment:  a. baseline creat 1.3 - 1.5. No date: Collagen vascular disease ([employer]) 11/16/2013: Congestive dilated cardiomyopathy ([place]) No date: COPD (chronic obstructive pulmonary disease) ([name]) No date: Coronary artery disease, non-occlusive     Comment:  a. 11/2013 MV: distal anteroseptal ischemia;  b. 12/2013               Cath: LM nl, LAD 32m, 90d, RI 70, LCX 9m, 40d, RCA 40p,               50d, RPLS 99, EF 40-45%, mild global HK-->Med rx; c.               03/2016 MV: fixed apical defect (attenuation), no               ischemia, EF 38%. 01/07/2014: Coronary atherosclerosis     Comment:  Formatting of this note might be different from the               original. Last Assessment & Plan:  Currently with no               symptoms of angina. No further workup at this time.               Continue current medication regimen. 11/16/2013: Diabetes mellitus, type 2 ([place]) 02/03/2015: Diabetic infection of left foot ([place]) 11/16/2013: Endomyocardial disease ([place])     Comment:  Formatting of this note might be different from the               original. Last Assessment & Plan:  Recommended he               continue his beta blocker, ACE inhibitor, nitrates. Blood              pressure borderline low, asymptomatic 01/17/2015:  Essential hypertension 11/16/2013: Familial multiple lipoprotein-type hyperlipidemia     Comment:  Formatting of this note might be different from the               original. Last Assessment & Plan:  Encouraged him to stay              on his lovastatin. Goal LDL less than 70 01/17/2015: Hereditary and idiopathic peripheral neuropathy 01/17/2015: HTN (hypertension) No date: Hyperlipidemia No date: Ischemic cardiomyopathy     Comment:  a. 10/2013 Echo: EF 35-40%, mild MR/TR;  b. 12/2013 Cath:               moderate diffuse CAD-->Med Rx. No date: Ischemic toe     Comment:  a. 10/2013 L fifth toe - felt to be either 2/2 embolic               plaque vs erythrocytosis - seen by vascular surgery ([facility])              ->conservative rx. 11/16/2013: Left bundle branch block 11/16/2013: Morbid obesity ([place]) 08/26/2013: Nondependent opioid abuse, continuous ([place]) 08/26/2013: Other chronic pain No date: Peripheral neuropathy No date: Secondary erythrocytosis     Comment:  a. followed by heme-onc with periodic phlebotomy. 11/16/2013: Smoker 11/16/2013: SOB (shortness of breath) No date: Tobacco abuse     Comment:  a. 47 yrs, up to 2 ppd, changed to e-cigarette 05/2013. 08/26/2013: Type 2 diabetes mellitus with diabetic neuropathy ([place]) 11/16/2013: Type 2 diabetes mellitus without complications ([place])     Comment:  Formatting of this note might be different from the               original. Last Assessment & Plan:  He is working with               primary care for improved diabetes control. Still not               well controlled. Recommended he watch his carbohydrates,               smaller meal portions  Reproductive/Obstetrics                            Anesthesia Physical Anesthesia Plan  ASA: 3  Anesthesia Plan: General   Post-op Pain Management: Minimal or no pain anticipated   Induction: Intravenous  PONV Risk Score and Plan: 1 and Propofol infusion, TIVA and  Ondansetron  Airway  Management Planned: Nasal Cannula  Additional Equipment: None  Intra-op Plan:   Post-operative Plan:   Informed Consent: I have reviewed the patients History and Physical, chart, labs and discussed the procedure including the risks, benefits and alternatives for the proposed anesthesia with the patient or authorized representative who has indicated his/her understanding and acceptance.     Dental advisory given  Plan Discussed with: CRNA and Surgeon  Anesthesia Plan Comments: (Discussed risks of anesthesia with patient, including possibility of difficulty with spontaneous ventilation under anesthesia necessitating airway intervention, PONV, and rare risks such as cardiac or respiratory or neurological events, and allergic reactions. Discussed the role of CRNA in patient's perioperative care. Patient understands. Patient counseled on benefits of smoking cessation, and increased perioperative risks associated with continued smoking. Patient informed about increased incidence of above perioperative risk due to high BMI. Patient understands. )        Anesthesia Quick Evaluation

## 2022-01-10 NOTE — Transfer of Care (Signed)
Immediate Anesthesia Transfer of Care Note  Patient: [name]  Procedure(s) Performed: CARDIOVERSION  Patient Location: Cath Lab  Anesthesia Type:General  Level of Consciousness: awake, alert  and oriented  Airway & Oxygen Therapy: Patient Spontanous Breathing and Patient connected to nasal cannula oxygen  Post-op Assessment: Report given to RN and Post -op Vital signs reviewed and stable  Post vital signs: Reviewed and stable  Last Vitals:  Vitals Value Taken Time  BP 124/76 01/10/22 0739  Temp    Pulse 58 01/10/22 0742  Resp 18 01/10/22 0743  SpO2 99 % 01/10/22 0742    Last Pain:  Vitals:   01/10/22 0702  PainSc: 0-No pain         Complications: No notable events documented.

## 2022-01-10 NOTE — Interval H&P Note (Signed)
History and Physical Interval Note:  01/10/2022 6:05 PM  [name]  has presented today for surgery, with the diagnosis of Cardioversion   AFib.  The various methods of treatment have been discussed with the patient and family. After consideration of risks, benefits and other options for treatment, the patient has consented to  Procedure(s): CARDIOVERSION (N/A) as a surgical intervention.  The patient's history has been reviewed, patient examined, no change in status, stable for surgery.  I have reviewed the patient's chart and labs.  Questions were answered to the patient's satisfaction.     Ida Rogue

## 2022-01-10 NOTE — CV Procedure (Signed)
Cardioversion procedure note For slow atrial flutter vs 2:1 AV block  Procedure Details:  Consent: Risks of procedure as well as the alternatives and risks of each were explained to the (patient/caregiver).  Consent for procedure obtained.  Time Out: Verified patient identification, verified procedure, site/side was marked, verified correct patient position, special equipment/implants available, medications/allergies/relevent history reviewed, required imaging and test results available.  Performed  Patient placed on cardiac monitor, pulse oximetry, supplemental oxygen as necessary.   Sedation given: propofol IV, Dr. Suzan Slick Pacer pads placed anterior and posterior chest.   Cardioverted 2 time(s).   Cardioverted at  150J, 200J. Synchronized biphasic No change in rhythm   Evaluation: Findings: Post procedure EKG shows: NSR with concern for 2:1 AV block Complications: None Patient did tolerate procedure well. Will discuss with referring provider   Time Spent Directly with the Patient:  45 minutes   Dossie Arbour, M.D., Ph.D.

## 2022-01-10 NOTE — Anesthesia Procedure Notes (Signed)
Date/Time: 01/10/2022 7:32 AM  Performed by: Lily Peer, Karl Erway, CRNAPre-anesthesia Checklist: Suction available, Patient identified, Emergency Drugs available, Patient being monitored and Timeout performed Patient Re-evaluated:Patient Re-evaluated prior to induction Oxygen Delivery Method: Nasal cannula Induction Type: IV induction

## 2022-01-10 NOTE — H&P (Signed)
H&P Addendum, pre-cardioversion ° °Patient was seen and evaluated prior to -cardioversion procedure °Symptoms, prior testing details again confirmed with the patient °Patient examined, no significant change from prior exam °Lab work reviewed in detail personally by myself °Patient understands risk and benefit of the procedure,  °The risks (stroke, cardiac arrhythmias rarely resulting in the need for a temporary or permanent pacemaker, skin irritation or burns and complications associated with conscious sedation including aspiration, arrhythmia, respiratory failure and death), benefits (restoration of normal sinus rhythm) and alternatives of a direct current cardioversion were explained in detail °Patient willing to proceed. ° °Signed, °Tim Duanna Runk, MD, Ph.D °[facility]  °

## 2022-01-11 ENCOUNTER — Encounter: Payer: Self-pay | Admitting: Cardiovascular Disease

## 2022-01-14 ENCOUNTER — Ambulatory Visit (INDEPENDENT_AMBULATORY_CARE_PROVIDER_SITE_OTHER): Payer: Medicare HMO

## 2022-01-14 ENCOUNTER — Telehealth: Payer: Self-pay | Admitting: Emergency Medicine

## 2022-01-14 DIAGNOSIS — I4819 Other persistent atrial fibrillation: Secondary | ICD-10-CM

## 2022-01-14 DIAGNOSIS — I447 Left bundle-branch block, unspecified: Secondary | ICD-10-CM

## 2022-01-14 NOTE — Telephone Encounter (Signed)
Called patient to go over recommendations. No answer. Lmtcb.  

## 2022-01-14 NOTE — Telephone Encounter (Signed)
-----   Message from Antonieta Iba, MD sent at 01/10/2022  6:09 PM EDT -----  cardioversion today Looked less like A-fib and more like normal sinus rhythm 2-1 AV block, left bundle branch block Would like to order a Zio monitor for 7 days then follow-up with lambert Cannot exclude higher degree AV block Thx TG

## 2022-01-15 NOTE — Telephone Encounter (Signed)
Patient is returning call.  °

## 2022-01-15 NOTE — Telephone Encounter (Signed)
Called patient to go over recommendations. No answer. Lmtcb.

## 2022-01-15 NOTE — Telephone Encounter (Signed)
Spoke w/ pt.  Made him aware of results and Dr. Windell Hummingbird recommendations. Zio order was placed yesterday, so it should be on it's way to him. He has appt w/ Dr. Lalla Brothers on 03/13/22.

## 2022-01-17 DIAGNOSIS — I4819 Other persistent atrial fibrillation: Secondary | ICD-10-CM

## 2022-01-17 DIAGNOSIS — I447 Left bundle-branch block, unspecified: Secondary | ICD-10-CM | POA: Diagnosis not present

## 2022-01-23 NOTE — Progress Notes (Deleted)
Cardiology Office Note    Date:  01/23/2022   ID:  Jerit, Tacker 09-Jan-1956, MRN [id number]  PCP:  [facility] [place]  Cardiologist:  Ida Rogue, MD  Electrophysiologist:  None   Chief Complaint: Follow-up  History of Present Illness:   [name] is a 66 y.o. male with history of CAD medically managed by [facility] in 2015, HFrEF, LBBB, DM2 with neuropathy, HTN, HLD, COPD, CKD stage III, obesity, secondary erythrocytosis, chronic venous stasis dermatitis, and tobacco use who presents for follow-up of CAD and cardiomyopathy.   LHC in 2015 showed mid LAD 50%, distal LAD 90%, proximal LCX 30%, mid LCx 40%, proximal ramus 70%, proximal RCA 40%, distal RCA 50%, RPLS 99% stenoses as outlined below.  Echo in 10/2013 demonstrated an EF of 35 to 40%, global hypokinesis, mild mitral regurgitation, severely increased LV posterior wall thickness, and mild tricuspid regurgitation.  Most recent ischemic evaluation by pharmacological MPI showed no ischemia with a small region of moderate intensity fixed defect in the apical region felt to be secondary to attenuation artifact.  LVEF estimated at 38%, though this was felt to be depressed due to inferior wall/GI uptake.  Overall, this was a low risk scan.  Prior admission for ischemic toe felt to be secondary to small plaque emboli.  He was last seen in the office in 07/2019 and was without symptoms of angina or decompensation.   He was lost to follow-up from 07/2019 until he was recently seen on 12/06/2021, at which time he noted a 3-week history of progressive lower extremity swelling with associated weeping.  He was without symptoms of angina or decompensation.  His weight was up 8 pounds when compared to his visit in 2020.  He had been out of metoprolol for at least 8 months, and reported in the printing on the pill bottle had worn off.  He did report adherence to aspirin, Imdur, lovastatin, and lisinopril.  He was not watching his salt or  fluid intake.  He was noted to be in newly diagnosed A-fib.  He was started on apixaban and aspirin was discontinued.  Toprol-XL was added, along with furosemide.  He was referred to the wound clinic for ongoing management of his lower extremity wounds and weeping.  Echo on 12/10/2021 showed an improved LV systolic function with an EF of 55 to 60%, no regional wall motion abnormalities, mild LVH, indeterminate LV diastolic function parameters, normal RV systolic function and ventricular cavity size, trivial mitral regurgitation, tricuspid aortic valve with mild insufficiency and mild stenosis with a mean gradient of 12.7 mmHg.  He was last seen in the office on 12/26/2021 and was without symptoms of angina decompensation.  He reported significant improvement in his dyspnea and lower extremity swelling.  He was adherent to cardiac medications.  He remained in rate controlled A-fib.  Relative hypotension and positional dizziness precluded escalation of GDMT with the recommendation to decrease lisinopril to 5 mg daily.  He was scheduled for DCCV on 01/10/2022.  However, this was canceled given EKG showed sinus bradycardia with 2-1 AV block with underlying left bundle.  Given this, DCCV was canceled.  Zio patch was ordered and is pending at this time.  ***   Labs independently reviewed: 12/2021 - Hgb 15.5, PLT 135, potassium 3.7, BUN 9, serum creatinine 1.32, albumin 2.9, AST/ALT normal, TSH normal, magnesium 2.2  Past Medical History:  Diagnosis Date   CAD (coronary artery disease) 01/07/2014   Cellulitis 12/17/2015  Cellulitis and abscess of foot 01/17/2015   Cellulitis and abscess of foot excluding toe 01/17/2015   Cellulitis of left leg 12/17/2015   Chest pain 11/16/2013   Chronic combined systolic and diastolic CHF (congestive heart failure) ([place]) 0000000   Chronic systolic CHF (congestive heart failure) ([place])    a. 10/2013 Echo: EF 35-40%.   Chronic, continuous use of opioids 08/26/2013   Circulatory  system disorder 11/16/2013   Formatting of this note might be different from the original. Last Assessment & Plan:  Possibly secondary to embolic phenomenon versus erythrocytosis. He is currently taking aspirin and Plavix   CKD (chronic kidney disease), stage III ([name])    a. baseline creat 1.3 - 1.5.   Collagen vascular disease ([name])    Congestive dilated cardiomyopathy ([place]) 11/16/2013   COPD (chronic obstructive pulmonary disease) ([name])    Coronary artery disease, non-occlusive    a. 11/2013 MV: distal anteroseptal ischemia;  b. 12/2013 Cath: LM nl, LAD 60m, 90d, RI 70, LCX 36m, 40d, RCA 40p, 50d, RPLS 99, EF 40-45%, mild global HK-->Med rx; c. 03/2016 MV: fixed apical defect (attenuation), no ischemia, EF 38%.   Coronary atherosclerosis 01/07/2014   Formatting of this note might be different from the original. Last Assessment & Plan:  Currently with no symptoms of angina. No further workup at this time. Continue current medication regimen.   Diabetes mellitus, type 2 ([place]) 11/16/2013   Diabetic infection of left foot ([place]) 02/03/2015   Endomyocardial disease ([place]) 11/16/2013   Formatting of this note might be different from the original. Last Assessment & Plan:  Recommended he continue his beta blocker, ACE inhibitor, nitrates. Blood pressure borderline low, asymptomatic   Essential hypertension 01/17/2015   Familial multiple lipoprotein-type hyperlipidemia 11/16/2013   Formatting of this note might be different from the original. Last Assessment & Plan:  Encouraged him to stay on his lovastatin. Goal LDL less than 70   Hereditary and idiopathic peripheral neuropathy 01/17/2015   HTN (hypertension) 01/17/2015   Hyperlipidemia    Ischemic cardiomyopathy    a. 10/2013 Echo: EF 35-40%, mild MR/TR;  b. 12/2013 Cath: moderate diffuse CAD-->Med Rx.   Ischemic toe    a. 10/2013 L fifth toe - felt to be either 2/2 embolic plaque vs erythrocytosis - seen by vascular surgery ([facility]) ->conservative rx.   Left bundle  branch block 11/16/2013   Morbid obesity ([place]) 11/16/2013   Nondependent opioid abuse, continuous ([place]) 08/26/2013   Other chronic pain 08/26/2013   Peripheral neuropathy    Secondary erythrocytosis    a. followed by heme-onc with periodic phlebotomy.   Smoker 11/16/2013   SOB (shortness of breath) 11/16/2013   Tobacco abuse    a. 47 yrs, up to 2 ppd, changed to e-cigarette 05/2013.   Type 2 diabetes mellitus with diabetic neuropathy ([place]) 08/26/2013   Type 2 diabetes mellitus without complications ([place]) [id number]   Formatting of this note might be different from the original. Last Assessment & Plan:  He is working with primary care for improved diabetes control. Still not well controlled. Recommended he watch his carbohydrates, smaller meal portions    Past Surgical History:  Procedure Laterality Date   ACHILLES TENDON SURGERY Left 03/08/2016   Procedure: ACHILLES LENGTHENING/KIDNER;  Surgeon: Samara Deist, DPM;  Location: [facility];  Service: Podiatry;  Laterality: Left;   CARDIOVERSION N/A 01/10/2022   Procedure: CARDIOVERSION;  Surgeon: Minna Merritts, MD;  Location: [facility];  Service: Cardiovascular;  Laterality: N/A;   I &  D EXTREMITY Left 01/18/2015   Procedure: IRRIGATION AND DEBRIDEMENT EXTREMITY;  Surgeon: Gwyneth Revels, DPM;  Location: [facility];  Service: Podiatry;  Laterality: Left;   ORIF TIBIA FRACTURE Left 2008   SKIN DEBRIDEMENT  03/08/2016   Procedure: DEBRIDEMENT SKIN FULL THICKNESS;  Surgeon: Gwyneth Revels, DPM;  Location: [facility];  Service: Podiatry;;   WOUND EXPLORATION Left 03/10/2015   Procedure: WOUND EXPLORATION/ SECONDARY CLOSURE OF WOUND,COMPLICATED;  Surgeon: Gwyneth Revels, DPM;  Location: [facility];  Service: Podiatry;  Laterality: Left;    Current Medications: No outpatient medications have been marked as taking for the 01/25/22 encounter (Appointment) with Sondra Barges, PA-C.    Allergies:   Metformin   Social History   Socioeconomic History   Marital status:  Significant Other    Spouse name: Not on file   Number of children: Not on file   Years of education: Not on file   Highest education level: Not on file  Occupational History   Not on file  Tobacco Use   Smoking status: Every Day    Packs/day: 0.25    Years: 46.00    Total pack years: 11.50    Types: Cigarettes, E-cigarettes   Smokeless tobacco: Never   Tobacco comments:    Currently smoking E cigarettes.  Vaping Use   Vaping Use: Never used  Substance and Sexual Activity   Alcohol use: Yes    Comment: alcohol abuse in the past. / rare beer   Drug use: No    Comment: marijuana 5 joints per week in the past 2013   Sexual activity: Not on file  Other Topics Concern   Not on file  Social History Narrative   He lives in [place] with his dtr and son-in-law.  On disability.  Does not routinely exercise.  Eats 6 scoops of banana ice cream every night.   Social Determinants of Health   Financial Resource Strain: Not on file  Food Insecurity: Not on file  Transportation Needs: Not on file  Physical Activity: Not on file  Stress: Not on file  Social Connections: Not on file     Family History:  The patient's family history includes Diabetes in his brother; Hyperlipidemia in his brother, brother, brother, and mother; Hypertension in his brother, brother, brother, maternal aunt, and mother.  ROS:   ROS   EKGs/Labs/Other Studies Reviewed:    Studies reviewed were summarized above. The additional studies were reviewed today:  2D echo 12/10/2021:  1. Left ventricular ejection fraction, by estimation, is 55 to 60%. The  left ventricle has normal function. The left ventricle has no regional  wall motion abnormalities. There is mild left ventricular hypertrophy.  Left ventricular diastolic parameters  are indeterminate.   2. Right ventricular systolic function is normal. The right ventricular  size is normal.   3. The mitral valve is normal in structure. Trivial mitral valve   regurgitation.   4. The aortic valve is tricuspid. Aortic valve regurgitation is mild.  Mild aortic valve stenosis. Aortic valve mean gradient measures 12.7 mmHg.   Comparison(s): LVEF 35-40%. __________   Eugenie Birks MPI 03/04/2016: 2 Day Pharmacological myocardial perfusion imaging study with no significant  Ischemia Small region of moderate intensity, fixed defect in the apical region (seen on both stress and rest images), likely secondary to attenuation artifact Normal wall motion, EF estimated at 38% (depressed ejection fraction likely secondary to inferior wall / GI uptake artifact) No EKG changes concerning for ischemia at peak stress or in  recovery. Baseline EKG with left bundle branch block Rare PVC Low risk scan Consider echocardiogram to confirm ejection fraction if clinically indicated __________   LHC 2015: Mid LAD 50%, distal LAD 90%, proximal LCX 30%, mid LCx 40%, proximal ramus 70%, proximal RCA 40%, distal RCA 50%, RPLS 99% __________   2D echo 10/2013: EF 35-40%, global hypokinesis, mild MR, severely increased LV posterior wall thickness, mild TR   EKG:  EKG is ordered today.  The EKG ordered today demonstrates ***  Recent Labs: 12/06/2021: ALT 17; Magnesium 2.2; TSH 3.603 12/26/2021: BUN 9; Creatinine, Ser 1.32; Hemoglobin 15.5; Platelets 135; Potassium 3.7; Sodium 136  Recent Lipid Panel No results found for: "CHOL", "TRIG", "HDL", "CHOLHDL", "VLDL", "LDLCALC", "LDLDIRECT"  PHYSICAL EXAM:    VS:  There were no vitals taken for this visit.  BMI: There is no height or weight on file to calculate BMI.  Physical Exam  Wt Readings from Last 3 Encounters:  01/10/22 (!) 328 lb (148.8 kg)  12/26/21 (!) 329 lb (149.2 kg)  12/06/21 (!) 328 lb (148.8 kg)     ASSESSMENT & PLAN:   CAD involving the native coronary arteries without***angina:  HFrEF with LBBB:  Persistent A-fib:  2:1 AV block:  HTN: Blood pressure  HLD: LDL  CKD stage III:  Going tobacco  and alcohol use:  Obesity:  Venous stasis dermatitis:   {Are you ordering a CV Procedure (e.g. stress test, cath, DCCV, TEE, etc)?   Press F2        :[id number]}     Disposition: F/u with Dr. Mariah Milling or an APP in ***.   Medication Adjustments/Labs and Tests Ordered: Current medicines are reviewed at length with the patient today.  Concerns regarding medicines are outlined above. Medication changes, Labs and Tests ordered today are summarized above and listed in the Patient Instructions accessible in Encounters.   Signed, Eula Listen, PA-C 01/23/2022 8:10 AM     [facility] [place] [address] [place], [place] [address] [phone]

## 2022-01-25 ENCOUNTER — Ambulatory Visit: Payer: Medicare HMO | Admitting: Physician Assistant

## 2022-01-28 NOTE — Progress Notes (Deleted)
Office Visit    Patient Name: [name] Date of Encounter: 01/28/2022  Primary Care Provider:  [facility] Primary Cardiologist:  Julien Nordmann, MD Primary Electrophysiologist: None  Chief Complaint    [name] is a 66 y.o. male with PMH of HFrEF, CAD, LBBB, DM type II, neuropathy, HTN, HLD, COPD, CKD stage III, obesity, tobacco use who presents for follow-up of cardiomyopathy and coronary artery disease.  Past Medical History    Past Medical History:  Diagnosis Date   CAD (coronary artery disease) 01/07/2014   Cellulitis 12/17/2015   Cellulitis and abscess of foot 01/17/2015   Cellulitis and abscess of foot excluding toe 01/17/2015   Cellulitis of left leg 12/17/2015   Chest pain 11/16/2013   Chronic combined systolic and diastolic CHF (congestive heart failure) ([name]) 01/17/2015   Chronic systolic CHF (congestive heart failure) ([name])    a. 10/2013 Echo: EF 35-40%.   Chronic, continuous use of opioids 08/26/2013   Circulatory system disorder 11/16/2013   Formatting of this note might be different from the original. Last Assessment & Plan:  Possibly secondary to embolic phenomenon versus erythrocytosis. He is currently taking aspirin and Plavix   CKD (chronic kidney disease), stage III ([name])    a. baseline creat 1.3 - 1.5.   Collagen vascular disease ([name])    Congestive dilated cardiomyopathy ([name]) 11/16/2013   COPD (chronic obstructive pulmonary disease) ([name])    Coronary artery disease, non-occlusive    a. 11/2013 MV: distal anteroseptal ischemia;  b. 12/2013 Cath: LM nl, LAD 55m, 90d, RI 70, LCX 39m, 40d, RCA 40p, 50d, RPLS 99, EF 40-45%, mild global HK-->Med rx; c. 03/2016 MV: fixed apical defect (attenuation), no ischemia, EF 38%.   Coronary atherosclerosis 01/07/2014   Formatting of this note might be different from the original. Last Assessment & Plan:  Currently with no symptoms of angina. No further workup at this time. Continue current medication  regimen.   Diabetes mellitus, type 2 ([name]) 11/16/2013   Diabetic infection of left foot ([name]) 02/03/2015   Endomyocardial disease ([name]) 11/16/2013   Formatting of this note might be different from the original. Last Assessment & Plan:  Recommended he continue his beta blocker, ACE inhibitor, nitrates. Blood pressure borderline low, asymptomatic   Essential hypertension 01/17/2015   Familial multiple lipoprotein-type hyperlipidemia 11/16/2013   Formatting of this note might be different from the original. Last Assessment & Plan:  Encouraged him to stay on his lovastatin. Goal LDL less than 70   Hereditary and idiopathic peripheral neuropathy 01/17/2015   HTN (hypertension) 01/17/2015   Hyperlipidemia    Ischemic cardiomyopathy    a. 10/2013 Echo: EF 35-40%, mild MR/TR;  b. 12/2013 Cath: moderate diffuse CAD-->Med Rx.   Ischemic toe    a. 10/2013 L fifth toe - felt to be either 2/2 embolic plaque vs erythrocytosis - seen by vascular surgery ([facility]) ->conservative rx.   Left bundle Diaz block 11/16/2013   Morbid obesity ([name]) 11/16/2013   Nondependent opioid abuse, continuous ([name]) 08/26/2013   Other chronic pain 08/26/2013   Peripheral neuropathy    Secondary erythrocytosis    a. followed by heme-onc with periodic phlebotomy.   Smoker 11/16/2013   SOB (shortness of breath) 11/16/2013   Tobacco abuse    a. 46 yrs, up to 2 ppd, changed to e-cigarette 05/2013.   Type 2 diabetes mellitus with diabetic neuropathy ([name]) 08/26/2013   Type 2 diabetes mellitus without complications ([name]) 11/16/2013   Formatting  of this note might be different from the original. Last Assessment & Plan:  He is working with primary care for improved diabetes control. Still not well controlled. Recommended he watch his carbohydrates, smaller meal portions   Past Surgical History:  Procedure Laterality Date   ACHILLES TENDON SURGERY Left 03/08/2016   Procedure: ACHILLES LENGTHENING/KIDNER;  Surgeon: Samara Deist, DPM;  Location: [facility]  ORS;  Service: Podiatry;  Laterality: Left;   CARDIOVERSION N/A 01/10/2022   Procedure: CARDIOVERSION;  Surgeon: Minna Merritts, MD;  Location: [facility] ORS;  Service: Cardiovascular;  Laterality: N/A;   I & D EXTREMITY Left 01/18/2015   Procedure: IRRIGATION AND DEBRIDEMENT EXTREMITY;  Surgeon: Samara Deist, DPM;  Location: [facility] ORS;  Service: Podiatry;  Laterality: Left;   ORIF TIBIA FRACTURE Left 2008   SKIN DEBRIDEMENT  03/08/2016   Procedure: DEBRIDEMENT SKIN FULL THICKNESS;  Surgeon: Samara Deist, DPM;  Location: [place] [facility];  Service: Podiatry;;   WOUND EXPLORATION Left 03/10/2015   Procedure: WOUND EXPLORATION/ SECONDARY CLOSURE OF WOUND,COMPLICATED;  Surgeon: Samara Deist, DPM;  Location: [facility] ORS;  Service: Podiatry;  Laterality: Left;    Allergies  Allergies  Allergen Reactions   Metformin     CKD    History of Present Illness    [name] is a 66 year old male with the above-mentioned past medical history who presents today for follow-up of cardiomyopathy and coronary artery disease.  [name] had [name] in 12/30/2013 that revealed distal LAD disease, with severe distal RCA disease and medical management recommended.  2D echo was also completed 10/2013 that revealed EF of 35-45% global hypokinesis, mild MR regurgitation, and severely increased LV posterior wall thickness.    He was lost to follow-up since 2020 until 12/2021 when he was seen in office for complaint of progressive lower extremity edema and weeping.  During visit he denied any anginal complaints and was found to have multiple medication compliance issues.  He also was noted to have solid indiscretions and was also found to be in new onset atrial fibrillation.  He was started on Eliquis and ASA was discontinued with Toprol-XL added for rate control.  He was referred to the wound clinic for management of his lower extremity weeping and Lasix was also initiated.  2D echo was completed that showed EF of 55-60%, and RWMA,  mild LVH intermediate LV diastolic function, tricuspid aortic valve with mild insufficiency and mild stenosis with a gradient of 12.7 mmHg.  Additional GDMT was deferred due to hypotension.  He was arranged for DCCV to normal sinus rhythm on 01/10/2022 however he failed to convert after 2 attempts.  Postprocedure EKG was concerning for 2-1 AV block and patient was provided a ZIO monitor for 7 days and will follow-up with Dr. Quentin Ore.     Since last being seen in the office patient reports***.  Patient denies chest pain, palpitations, dyspnea, PND, orthopnea, nausea, vomiting, dizziness, syncope, edema, weight gain, or early satiety.     ***Notes:  Home Medications    Current Outpatient Medications  Medication Sig Dispense Refill   albuterol (PROVENTIL HFA;VENTOLIN HFA) 108 (90 BASE) MCG/ACT inhaler Inhale 2 puffs into the lungs every 4 (four) hours as needed for wheezing or shortness of breath.     apixaban (ELIQUIS) 5 MG TABS tablet Take 1 tablet (5 mg total) by mouth 2 (two) times daily. 60 tablet 11   citalopram (CELEXA) 10 MG tablet Take 10 mg by mouth daily.     furosemide (LASIX) 20 MG tablet  Take 1 tablet (20 mg total) by mouth daily. 90 tablet 3   glipiZIDE (GLUCOTROL XL) 10 MG 24 hr tablet Take 10 mg by mouth daily.  3   isosorbide mononitrate (IMDUR) 30 MG 24 hr tablet Take 0.5 tablets (15 mg total) by mouth daily. 45 tablet 3   linagliptin (TRADJENTA) 5 MG TABS tablet Take 5 mg by mouth every morning.      lisinopril (ZESTRIL) 5 MG tablet Take 1 tablet (5 mg total) by mouth daily. 90 tablet 3   loratadine (CLARITIN) 10 MG tablet Take 10 mg by mouth daily as needed for allergies (seasonal allergies in the spring).      lovastatin (MEVACOR) 40 MG tablet Take 40 mg by mouth at bedtime.     metoprolol succinate (TOPROL XL) 25 MG 24 hr tablet Take 1 tablet (25 mg total) by mouth daily. 90 tablet 3   metoprolol tartrate (LOPRESSOR) 25 MG tablet TAKE 1 TABLET(25 MG) BY MOUTH TWICE DAILY  180 tablet 0   nitroGLYCERIN (NITROSTAT) 0.4 MG SL tablet Place 1 tablet (0.4 mg total) under the tongue every 5 (five) minutes as needed for chest pain. 25 tablet 3   pregabalin (LYRICA) 100 MG capsule Take 100 mg by mouth 3 (three) times daily.     sertraline (ZOLOFT) 100 MG tablet Take 100 mg by mouth at bedtime.     traZODone (DESYREL) 150 MG tablet TAKE 1 TABLET BY MOUTH AT BEDTIME. 30 tablet 0   No current facility-administered medications for this visit.     Review of Systems  Please see the history of present illness.    (+)*** (+)***  All other systems reviewed and are otherwise negative except as noted above.  Physical Exam    Wt Readings from Last 3 Encounters:  01/10/22 (!) 328 lb (148.8 kg)  12/26/21 (!) 329 lb (149.2 kg)  12/06/21 (!) 328 lb (148.8 kg)   [id number] were no vitals filed for this visit.,There is no height or weight on file to calculate BMI.  Constitutional:      Appearance: Healthy appearance. Not in distress.  Neck:     Vascular: JVD normal.  Pulmonary:     Effort: Pulmonary effort is normal.     Breath sounds: No wheezing. No rales. Diminished in the bases Cardiovascular:     Normal rate. Regular rhythm. Normal S1. Normal S2.      Murmurs: There is no murmur.  Edema:    Peripheral edema absent.  Abdominal:     Palpations: Abdomen is soft non tender. There is no hepatomegaly.  Skin:    General: Skin is warm and dry.  Neurological:     General: No focal deficit present.     Mental Status: Alert and oriented to person, place and time.     Cranial Nerves: Cranial nerves are intact.  EKG/LABS/Other Studies Reviewed    ECG personally reviewed by me today - ***  Risk Assessment/Calculations:   {Does this patient have ATRIAL FIBRILLATION?:[phone]}        Lab Results  Component Value Date   WBC 9.4 12/26/2021   HGB 15.5 12/26/2021   HCT 48.5 12/26/2021   MCV 86.6 12/26/2021   PLT 135 (L) 12/26/2021   Lab Results  Component Value  Date   CREATININE 1.32 (H) 12/26/2021   BUN 9 12/26/2021   NA 136 12/26/2021   K 3.7 12/26/2021   CL 101 12/26/2021   CO2 27 12/26/2021   Lab Results  Component Value  Date   ALT 17 12/06/2021   AST 32 12/06/2021   ALKPHOS 141 (H) 12/06/2021   BILITOT 1.3 (H) 12/06/2021   No results found for: "CHOL", "HDL", "LDLCALC", "LDLDIRECT", "TRIG", "CHOLHDL"  No results found for: "HGBA1C"  Assessment & Plan    1.  Coronary artery disease  2.  HFrEF  3.  2-1 AV block  4.  HTN  5.  HLD  6.  CKD stage III  7.  Obesity  8.  Polysubstance abuse:      Disposition: Follow-up with Julien Nordmann, MD or APP in *** months {Are you ordering a CV Procedure (e.g. stress test, cath, DCCV, TEE, etc)?   Press F2        :[id number]}   Medication Adjustments/Labs and Tests Ordered: Current medicines are reviewed at length with the patient today.  Concerns regarding medicines are outlined above.   Signed, Napoleon Form, Leodis Rains, NP 01/28/2022, 12:55 PM [facility]

## 2022-01-31 ENCOUNTER — Other Ambulatory Visit: Payer: Self-pay

## 2022-01-31 ENCOUNTER — Inpatient Hospital Stay
Admission: EM | Admit: 2022-01-31 | Discharge: 2022-02-03 | DRG: 603 | Disposition: A | Discharge status: Home Health | Payer: Medicare Other | Attending: Internal Medicine | Admitting: Internal Medicine

## 2022-01-31 ENCOUNTER — Emergency Department: Payer: Medicare Other

## 2022-01-31 ENCOUNTER — Ambulatory Visit: Payer: Medicare HMO | Admitting: Nurse Practitioner

## 2022-01-31 DIAGNOSIS — I890 Lymphedema, not elsewhere classified: Secondary | ICD-10-CM | POA: Diagnosis not present

## 2022-01-31 DIAGNOSIS — I130 Hypertensive heart and chronic kidney disease with heart failure and stage 1 through stage 4 chronic kidney disease, or unspecified chronic kidney disease: Secondary | ICD-10-CM | POA: Diagnosis not present

## 2022-01-31 DIAGNOSIS — L03116 Cellulitis of left lower limb: Principal | ICD-10-CM | POA: Diagnosis present

## 2022-01-31 DIAGNOSIS — F17210 Nicotine dependence, cigarettes, uncomplicated: Secondary | ICD-10-CM | POA: Diagnosis present

## 2022-01-31 DIAGNOSIS — J449 Chronic obstructive pulmonary disease, unspecified: Secondary | ICD-10-CM | POA: Diagnosis not present

## 2022-01-31 DIAGNOSIS — I420 Dilated cardiomyopathy: Secondary | ICD-10-CM | POA: Diagnosis not present

## 2022-01-31 DIAGNOSIS — Z8249 Family history of ischemic heart disease and other diseases of the circulatory system: Secondary | ICD-10-CM

## 2022-01-31 DIAGNOSIS — I482 Chronic atrial fibrillation, unspecified: Secondary | ICD-10-CM | POA: Diagnosis present

## 2022-01-31 DIAGNOSIS — Z83438 Family history of other disorder of lipoprotein metabolism and other lipidemia: Secondary | ICD-10-CM

## 2022-01-31 DIAGNOSIS — I2510 Atherosclerotic heart disease of native coronary artery without angina pectoris: Secondary | ICD-10-CM | POA: Diagnosis present

## 2022-01-31 DIAGNOSIS — N1831 Chronic kidney disease, stage 3a: Secondary | ICD-10-CM

## 2022-01-31 DIAGNOSIS — E7849 Other hyperlipidemia: Secondary | ICD-10-CM | POA: Diagnosis not present

## 2022-01-31 DIAGNOSIS — I83029 Varicose veins of left lower extremity with ulcer of unspecified site: Secondary | ICD-10-CM | POA: Diagnosis not present

## 2022-01-31 DIAGNOSIS — E11628 Type 2 diabetes mellitus with other skin complications: Secondary | ICD-10-CM | POA: Diagnosis present

## 2022-01-31 DIAGNOSIS — E785 Hyperlipidemia, unspecified: Secondary | ICD-10-CM | POA: Diagnosis present

## 2022-01-31 DIAGNOSIS — I447 Left bundle-branch block, unspecified: Secondary | ICD-10-CM | POA: Diagnosis not present

## 2022-01-31 DIAGNOSIS — I255 Ischemic cardiomyopathy: Secondary | ICD-10-CM | POA: Diagnosis not present

## 2022-01-31 DIAGNOSIS — Z7901 Long term (current) use of anticoagulants: Secondary | ICD-10-CM | POA: Diagnosis not present

## 2022-01-31 DIAGNOSIS — E1140 Type 2 diabetes mellitus with diabetic neuropathy, unspecified: Secondary | ICD-10-CM | POA: Diagnosis present

## 2022-01-31 DIAGNOSIS — D751 Secondary polycythemia: Secondary | ICD-10-CM | POA: Diagnosis not present

## 2022-01-31 DIAGNOSIS — E876 Hypokalemia: Secondary | ICD-10-CM

## 2022-01-31 DIAGNOSIS — Z7982 Long term (current) use of aspirin: Secondary | ICD-10-CM

## 2022-01-31 DIAGNOSIS — Z833 Family history of diabetes mellitus: Secondary | ICD-10-CM

## 2022-01-31 DIAGNOSIS — I5042 Chronic combined systolic (congestive) and diastolic (congestive) heart failure: Secondary | ICD-10-CM | POA: Diagnosis present

## 2022-01-31 DIAGNOSIS — G8929 Other chronic pain: Secondary | ICD-10-CM | POA: Diagnosis present

## 2022-01-31 DIAGNOSIS — I10 Essential (primary) hypertension: Secondary | ICD-10-CM | POA: Diagnosis present

## 2022-01-31 DIAGNOSIS — Z7984 Long term (current) use of oral hypoglycemic drugs: Secondary | ICD-10-CM | POA: Diagnosis not present

## 2022-01-31 DIAGNOSIS — L97929 Non-pressure chronic ulcer of unspecified part of left lower leg with unspecified severity: Secondary | ICD-10-CM | POA: Diagnosis present

## 2022-01-31 DIAGNOSIS — E1122 Type 2 diabetes mellitus with diabetic chronic kidney disease: Secondary | ICD-10-CM | POA: Diagnosis present

## 2022-01-31 DIAGNOSIS — Z720 Tobacco use: Secondary | ICD-10-CM | POA: Diagnosis present

## 2022-01-31 DIAGNOSIS — F32A Depression, unspecified: Secondary | ICD-10-CM | POA: Diagnosis present

## 2022-01-31 DIAGNOSIS — L089 Local infection of the skin and subcutaneous tissue, unspecified: Secondary | ICD-10-CM | POA: Diagnosis present

## 2022-01-31 DIAGNOSIS — Z79899 Other long term (current) drug therapy: Secondary | ICD-10-CM

## 2022-01-31 LAB — COMPREHENSIVE METABOLIC PANEL
ALT: 13 U/L (ref 0–44)
AST: 23 U/L (ref 15–41)
Albumin: 3 g/dL — ABNORMAL LOW (ref 3.5–5.0)
Alkaline Phosphatase: 110 U/L (ref 38–126)
Anion gap: 5 (ref 5–15)
BUN: 10 mg/dL (ref 8–23)
CO2: 28 mmol/L (ref 22–32)
Calcium: 8.6 mg/dL — ABNORMAL LOW (ref 8.9–10.3)
Chloride: 102 mmol/L (ref 98–111)
Creatinine, Ser: 1.25 mg/dL — ABNORMAL HIGH (ref 0.61–1.24)
GFR, Estimated: >60 mL/min (ref >60)
Glucose, Bld: 135 mg/dL — ABNORMAL HIGH (ref 70–99)
Potassium: 3.4 mmol/L — ABNORMAL LOW (ref 3.5–5.1)
Sodium: 135 mmol/L (ref 135–145)
Total Bilirubin: 1.4 mg/dL — ABNORMAL HIGH (ref 0.3–1.2)
Total Protein: 7.7 g/dL (ref 6.5–8.1)

## 2022-01-31 LAB — CBC WITH DIFFERENTIAL/PLATELET
Abs Immature Granulocytes: 0.01 10*3/uL (ref 0.00–0.07)
Basophils Absolute: 0 10*3/uL (ref 0.0–0.1)
Basophils Relative: 1 %
Eosinophils Absolute: 0.1 10*3/uL (ref 0.0–0.5)
Eosinophils Relative: 2 %
HCT: 46 % (ref 39.0–52.0)
Hemoglobin: 14.7 g/dL (ref 13.0–17.0)
Immature Granulocytes: 0 %
Lymphocytes Relative: 19 %
Lymphs Abs: 1.2 10*3/uL (ref 0.7–4.0)
MCH: 27.4 pg (ref 26.0–34.0)
MCHC: 32 g/dL (ref 30.0–36.0)
MCV: 85.7 fL (ref 80.0–100.0)
Monocytes Absolute: 0.5 10*3/uL (ref 0.1–1.0)
Monocytes Relative: 8 %
Neutro Abs: 4.4 10*3/uL (ref 1.7–7.7)
Neutrophils Relative %: 70 %
Platelets: 108 10*3/uL — ABNORMAL LOW (ref 150–400)
RBC: 5.37 MIL/uL (ref 4.22–5.81)
RDW: 15.8 % — ABNORMAL HIGH (ref 11.5–15.5)
WBC: 6.3 10*3/uL (ref 4.0–10.5)
nRBC: 0 % (ref 0.0–0.2)

## 2022-01-31 LAB — HIV ANTIBODY (ROUTINE TESTING W REFLEX): HIV Screen 4th Generation wRfx: NONREACTIVE

## 2022-01-31 LAB — URINALYSIS, ROUTINE W REFLEX MICROSCOPIC
Bacteria, UA: NONE SEEN
Bilirubin Urine: NEGATIVE
Glucose, UA: 150 mg/dL — AB
Ketones, ur: NEGATIVE mg/dL
Leukocytes,Ua: NEGATIVE
Nitrite: NEGATIVE
Protein, ur: NEGATIVE mg/dL
Specific Gravity, Urine: 1.005 (ref 1.005–1.030)
pH: 5 (ref 5.0–8.0)

## 2022-01-31 LAB — GLUCOSE, CAPILLARY: Glucose-Capillary: 119 mg/dL — ABNORMAL HIGH (ref 70–99)

## 2022-01-31 LAB — PROTIME-INR
INR: 1.4 — ABNORMAL HIGH (ref 0.8–1.2)
Prothrombin Time: 16.8 seconds — ABNORMAL HIGH (ref 11.4–15.2)

## 2022-01-31 LAB — LACTIC ACID, PLASMA
Lactic Acid, Venous: 1.6 mmol/L (ref 0.5–1.9)
Lactic Acid, Venous: 2.6 mmol/L (ref 0.5–1.9)

## 2022-01-31 MED ORDER — APIXABAN 5 MG PO TABS
5.0000 mg | ORAL_TABLET | Freq: Two times a day (BID) | ORAL | Status: DC
  Administered 2022-01-31 – 2022-02-03 (×6): 5 mg via ORAL
  Filled 2022-01-31 (×6): qty 1

## 2022-01-31 MED ORDER — VANCOMYCIN HCL 2000 MG/400ML IV SOLN
2000.0000 mg | INTRAVENOUS | Status: DC
  Administered 2022-02-01 – 2022-02-02 (×2): 2000 mg via INTRAVENOUS
  Filled 2022-01-31 (×3): qty 400

## 2022-01-31 MED ORDER — NITROGLYCERIN 0.4 MG SL SUBL: 0.4000 mg | SUBLINGUAL_TABLET | SUBLINGUAL | Status: DC | PRN

## 2022-01-31 MED ORDER — METOPROLOL SUCCINATE ER 25 MG PO TB24
25.0000 mg | ORAL_TABLET | Freq: Every day | ORAL | Status: DC
  Administered 2022-02-01 – 2022-02-03 (×3): 25 mg via ORAL
  Filled 2022-01-31 (×3): qty 1

## 2022-01-31 MED ORDER — TRAZODONE HCL 50 MG PO TABS
150.0000 mg | ORAL_TABLET | Freq: Every day | ORAL | Status: DC
Start: 2022-01-31 — End: 2022-02-03
  Administered 2022-01-31 – 2022-02-02 (×3): 150 mg via ORAL
  Filled 2022-01-31 (×3): qty 3

## 2022-01-31 MED ORDER — ACETAMINOPHEN 650 MG RE SUPP: 650.0000 mg | Freq: Four times a day (QID) | RECTAL | Status: DC | PRN

## 2022-01-31 MED ORDER — PREGABALIN 50 MG PO CAPS
100.0000 mg | ORAL_CAPSULE | Freq: Three times a day (TID) | ORAL | Status: DC
  Administered 2022-01-31 – 2022-02-03 (×7): 100 mg via ORAL
  Filled 2022-01-31 (×7): qty 2

## 2022-01-31 MED ORDER — SENNOSIDES-DOCUSATE SODIUM 8.6-50 MG PO TABS: 1.0000 | ORAL_TABLET | Freq: Every evening | ORAL | Status: DC | PRN

## 2022-01-31 MED ORDER — INSULIN ASPART 100 UNIT/ML IJ SOLN
0.0000 [IU] | Freq: Three times a day (TID) | INTRAMUSCULAR | Status: DC
  Administered 2022-02-01 (×2): 2 [IU] via SUBCUTANEOUS
  Filled 2022-01-31 (×2): qty 1

## 2022-01-31 MED ORDER — ENOXAPARIN SODIUM 40 MG/0.4ML IJ SOSY
40.0000 mg | PREFILLED_SYRINGE | Freq: Every day | INTRAMUSCULAR | Status: DC
  Filled 2022-01-31: qty 0.4

## 2022-01-31 MED ORDER — VANCOMYCIN HCL IN DEXTROSE 1-5 GM/200ML-% IV SOLN
1000.0000 mg | Freq: Once | INTRAVENOUS | Status: AC
  Administered 2022-01-31: 1000 mg via INTRAVENOUS
  Filled 2022-01-31: qty 200

## 2022-01-31 MED ORDER — ONDANSETRON HCL 4 MG/2ML IJ SOLN: 4.0000 mg | Freq: Four times a day (QID) | INTRAMUSCULAR | Status: DC | PRN

## 2022-01-31 MED ORDER — ACETAMINOPHEN 325 MG PO TABS
650.0000 mg | ORAL_TABLET | Freq: Four times a day (QID) | ORAL | Status: DC | PRN
  Administered 2022-02-01 – 2022-02-03 (×3): 650 mg via ORAL
  Filled 2022-01-31 (×3): qty 2

## 2022-01-31 MED ORDER — OXYCODONE-ACETAMINOPHEN 5-325 MG PO TABS
1.0000 | ORAL_TABLET | Freq: Once | ORAL | Status: AC
  Administered 2022-01-31: 1 via ORAL
  Filled 2022-01-31: qty 1

## 2022-01-31 MED ORDER — SERTRALINE HCL 50 MG PO TABS
100.0000 mg | ORAL_TABLET | Freq: Every day | ORAL | Status: DC
  Administered 2022-01-31 – 2022-02-02 (×3): 100 mg via ORAL
  Filled 2022-01-31 (×3): qty 2

## 2022-01-31 MED ORDER — LISINOPRIL 5 MG PO TABS
5.0000 mg | ORAL_TABLET | Freq: Every day | ORAL | Status: DC
  Administered 2022-02-01 – 2022-02-03 (×3): 5 mg via ORAL
  Filled 2022-01-31 (×3): qty 1

## 2022-01-31 MED ORDER — VANCOMYCIN HCL 1500 MG/300ML IV SOLN
1500.0000 mg | Freq: Once | INTRAVENOUS | Status: AC
  Administered 2022-01-31: 1500 mg via INTRAVENOUS
  Filled 2022-01-31 (×2): qty 300

## 2022-01-31 MED ORDER — INSULIN ASPART 100 UNIT/ML IJ SOLN: 0.0000 [IU] | Freq: Every day | INTRAMUSCULAR | Status: DC

## 2022-01-31 MED ORDER — SODIUM CHLORIDE 0.9 % IV SOLN: 2.0000 g | Freq: Three times a day (TID) | INTRAVENOUS | Status: DC

## 2022-01-31 MED ORDER — NICOTINE 14 MG/24HR TD PT24: 14.0000 mg | MEDICATED_PATCH | Freq: Every day | TRANSDERMAL | Status: DC | PRN

## 2022-01-31 MED ORDER — PRAVASTATIN SODIUM 20 MG PO TABS
40.0000 mg | ORAL_TABLET | Freq: Every day | ORAL | Status: DC
  Administered 2022-01-31 – 2022-02-01 (×2): 40 mg via ORAL
  Filled 2022-01-31 (×2): qty 2

## 2022-01-31 MED ORDER — FUROSEMIDE 20 MG PO TABS
20.0000 mg | ORAL_TABLET | Freq: Every day | ORAL | Status: DC
  Administered 2022-02-01 – 2022-02-03 (×3): 20 mg via ORAL
  Filled 2022-01-31 (×3): qty 1

## 2022-01-31 MED ORDER — ISOSORBIDE MONONITRATE ER 30 MG PO TB24
15.0000 mg | ORAL_TABLET | Freq: Every day | ORAL | Status: DC
  Administered 2022-02-01 – 2022-02-03 (×3): 15 mg via ORAL
  Filled 2022-01-31 (×3): qty 1

## 2022-01-31 MED ORDER — ALBUTEROL SULFATE (2.5 MG/3ML) 0.083% IN NEBU
3.0000 mL | INHALATION_SOLUTION | RESPIRATORY_TRACT | Status: DC | PRN
Start: 2022-01-31 — End: 2022-02-03

## 2022-01-31 MED ORDER — CITALOPRAM HYDROBROMIDE 20 MG PO TABS: 10.0000 mg | ORAL_TABLET | Freq: Every day | ORAL | Status: DC

## 2022-01-31 MED ORDER — CEFEPIME HCL 1 G IJ SOLR
1.0000 g | Freq: Once | INTRAMUSCULAR | Status: AC
  Administered 2022-01-31: 1 g via INTRAVENOUS
  Filled 2022-01-31: qty 10

## 2022-01-31 MED ORDER — ONDANSETRON HCL 4 MG PO TABS: 4.0000 mg | ORAL_TABLET | Freq: Four times a day (QID) | ORAL | Status: DC | PRN

## 2022-01-31 NOTE — Hospital Course (Addendum)
Mr. Kirubel Aja is a 66 year old male with hypertension, non-insulin-dependent diabetes mellitus, hyperlipidemia, depression, neuropathy, who presents emergency department for chief concerns of left leg worsening wound. Patient is diagnosed with left leg cellulitis, started on antibiotics with cefepime and vancomycin. ABI did not show significant arterial occlusion. Patient was treated for 3 days, condition much improved.  At my examination today, patient the left leg has venous stasis ulcerations, no deep wound.  Redness much improved.  At this point, he will need additional 5 days of Keflex.  Patient be referred to wound care, I will also request home health RN for dressing changes.

## 2022-01-31 NOTE — Assessment & Plan Note (Signed)
-   Pravastatin 40 mg daily resumed 

## 2022-01-31 NOTE — H&P (Addendum)
History and Physical   [name] [id number] DOB: [birth year] DOA: 01/31/2022  PCP: [facility] [place]  Outpatient Specialists: Dr. Rockey Situ, [facility] cardiology Patient coming from: Home  I have personally briefly reviewed patient's old medical records in [place].  Chief Concern: Left lower extremity swelling and bleeding  HPI: [name] is a 65 year old male with hypertension, non-insulin-dependent diabetes mellitus, hyperlipidemia, depression, neuropathy, who presents emergency department for chief concerns of left leg worsening wound.  Initial vitals in the emergency department showed temperature of 97.6, respiration rate of 18, heart rate 64, blood pressure 118/65, SPO2 of 96% on room air.  Serum sodium is 135, potassium 3.4, chloride 102, bicarb 28, BUN of 10, serum creatinine of 1.26, GFR greater than 60, nonfasting blood glucose 135, lactic acid 1.6, WBC 6.3, hemoglobin 14.7, platelets of 108.  ED treatment: vancomycin, cefepime IV. Oxycodone-acetaminophen 5 mg.  At bedside he is able to tell me his full name, age, current calendar year, current location.  He reports that his left lower leg started swelling 1.5 weeks ago.  He denies trauma, bug bites, dysuria, fever, vomiting.  He also endorses pain of his leg with increased warmth.  He endorses nausea and denies vomiting.  He denies chest pain, shortness of breath, abdominal pain, dysuria, hematuria, diarrhea.  Social history: He lives at home with his girlfriend. He smokes 1 ppd. He drinks 6 beers per week. He smokes THC. He is retired and formerly worked in a Education officer, community.   ROS: Constitutional: no weight change, no fever ENT/Mouth: no sore throat, no rhinorrhea Eyes: no eye pain, no vision changes Cardiovascular: no chest pain, no dyspnea,  no edema, no palpitations Respiratory: no cough, no sputum, no wheezing Gastrointestinal: no nausea, no vomiting, no diarrhea, no  constipation Genitourinary: no urinary incontinence, no dysuria, no hematuria Musculoskeletal: no arthralgias, no myalgias Skin: no skin lesions, no pruritus, Neuro: + weakness, no loss of consciousness, no syncope Psych: no anxiety, no depression, + decrease appetite Heme/Lymph: no bruising, no bleeding  ED Course: Discussed with emergency medicine provider, patient requiring hospitalization for chief concerns of poor wound healing in setting of diabetic patient.  Assessment/Plan  Principal Problem:   Cellulitis of left anterior lower leg Active Problems:   Morbid obesity ([place])   Hyperlipidemia   Left bundle branch block   CAD (coronary artery disease)   HTN (hypertension)   COPD (chronic obstructive pulmonary disease) ([name])   Tobacco abuse   Type 2 diabetes mellitus with diabetic neuropathy ([name])   Diabetic infection of left foot ([name])   Essential hypertension   Atrial fibrillation, chronic ([name])   Assessment and Plan:  * Cellulitis of left anterior lower leg Poor wound healing with bilateral venous stasis - Cefepime and vancomycin - Check MRSA PCR - ABI of the lower extremities ordered - Admit to MedSurg, observation - A.m. team to consult vascular pending ABI read  Atrial fibrillation, chronic ([place]) - Resumed home metoprolol succinate, apixaban 5 mg p.o. twice daily  Essential hypertension - Resumed home lisinopril 5 mg daily, isosorbide mononitrate 15 mg daily, metoprolol succinate 25 mg daily, furosemide 20 mg daily  Type 2 diabetes mellitus with diabetic neuropathy ([name]) - Home glipizide and Tradjenta has not been resumed while inpatient - Insulin SSI with at bedtime coverage ordered  Tobacco abuse - As needed nicotine patch ordered  CAD (coronary artery disease) - Imdur, statin, ACEi resumed  Hyperlipidemia - Pravastatin 40 mg daily resumed  Morbid obesity (  [name]) - This meets criteria for morbid obesity based on the presence of 1 or more chronic  comorbidities. Patient has hypertension, hyperlipidemia, non-insulin-dependent IVs mellitus. This complicates overall care and prognosis.   Chart reviewed.   DVT prophylaxis: Eliquis Code Status: full code Diet: Heart healthy/carb modified Family Communication: A phone call was offered, patient declined Disposition Plan: Pending clinical course Consults called: None at this time Admission status: MedSurg, observation  Past Medical History:  Diagnosis Date   CAD (coronary artery disease) 01/07/2014   Cellulitis 12/17/2015   Cellulitis and abscess of foot 01/17/2015   Cellulitis and abscess of foot excluding toe 01/17/2015   Cellulitis of left leg 12/17/2015   Chest pain 11/16/2013   Chronic combined systolic and diastolic CHF (congestive heart failure) ([name]) 01/17/2015   Chronic systolic CHF (congestive heart failure) ([name])    a. 10/2013 Echo: EF 35-40%.   Chronic, continuous use of opioids 08/26/2013   Circulatory system disorder 11/16/2013   Formatting of this note might be different from the original. Last Assessment & Plan:  Possibly secondary to embolic phenomenon versus erythrocytosis. He is currently taking aspirin and Plavix   CKD (chronic kidney disease), stage III ([name])    a. baseline creat 1.3 - 1.5.   Collagen vascular disease ([name])    Congestive dilated cardiomyopathy ([name]) 11/16/2013   COPD (chronic obstructive pulmonary disease) ([name])    Coronary artery disease, non-occlusive    a. 11/2013 MV: distal anteroseptal ischemia;  b. 12/2013 Cath: LM nl, LAD 4m, 90d, RI 70, LCX 54m, 40d, RCA 40p, 50d, RPLS 99, EF 40-45%, mild global HK-->Med rx; c. 03/2016 MV: fixed apical defect (attenuation), no ischemia, EF 38%.   Coronary atherosclerosis 01/07/2014   Formatting of this note might be different from the original. Last Assessment & Plan:  Currently with no symptoms of angina. No further workup at this time. Continue current medication regimen.   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 Last Assessment & Plan:  Recommended he continue his beta blocker, ACE inhibitor, nitrates. Blood pressure borderline low, asymptomatic   Essential hypertension 01/17/2015   Familial multiple lipoprotein-type hyperlipidemia 11/16/2013   Formatting of this note might be different from the original. Last Assessment & Plan:  Encouraged him to stay on his lovastatin. Goal LDL less than 70   Hereditary and idiopathic peripheral neuropathy 01/17/2015   HTN (hypertension) 01/17/2015   Hyperlipidemia    Ischemic cardiomyopathy    a. 10/2013 Echo: EF 35-40%, mild MR/TR;  b. 12/2013 Cath: moderate diffuse CAD-->Med Rx.   Ischemic toe    a. 10/2013 L fifth toe - felt to be either 2/2 embolic plaque vs erythrocytosis - seen by vascular surgery ([facility]) ->conservative rx.   Left bundle branch block 11/16/2013   Morbid obesity ([name]) 11/16/2013   Nondependent opioid abuse, continuous ([name]) 08/26/2013   Other chronic pain 08/26/2013   Peripheral neuropathy    Secondary erythrocytosis    a. followed by heme-onc with periodic phlebotomy.   Smoker 11/16/2013   SOB (shortness of breath) 11/16/2013   Tobacco abuse    a. 46 yrs, up to 2 ppd, changed to e-cigarette 05/2013.   Type 2 diabetes mellitus with diabetic neuropathy ([name]) 08/26/2013   Type 2 diabetes mellitus without complications ([name]) 11/16/2013   Formatting of this note might be different from the original. Last  Assessment & Plan:  He is working with primary care for improved diabetes control. Still not well controlled. Recommended he watch his carbohydrates, smaller meal portions   Past Surgical History:  Procedure Laterality Date   ACHILLES TENDON SURGERY Left 03/08/2016   Procedure: ACHILLES LENGTHENING/KIDNER;  Surgeon: Samara Deist, DPM;  Location: [facility] ORS;  Service: Podiatry;  Laterality: Left;    CARDIOVERSION N/A 01/10/2022   Procedure: CARDIOVERSION;  Surgeon: Minna Merritts, MD;  Location: [facility] ORS;  Service: Cardiovascular;  Laterality: N/A;   I & D EXTREMITY Left 01/18/2015   Procedure: IRRIGATION AND DEBRIDEMENT EXTREMITY;  Surgeon: Samara Deist, DPM;  Location: [facility] ORS;  Service: Podiatry;  Laterality: Left;   ORIF TIBIA FRACTURE Left 2008   SKIN DEBRIDEMENT  03/08/2016   Procedure: DEBRIDEMENT SKIN FULL THICKNESS;  Surgeon: Samara Deist, DPM;  Location: [facility];  Service: Podiatry;;   WOUND EXPLORATION Left 03/10/2015   Procedure: WOUND EXPLORATION/ SECONDARY CLOSURE OF WOUND,COMPLICATED;  Surgeon: Samara Deist, DPM;  Location: [facility] ORS;  Service: Podiatry;  Laterality: Left;   Social History:  reports that he has been smoking cigarettes and e-cigarettes. He has a 11.50 pack-year smoking history. He has never used smokeless tobacco. He reports current alcohol use. He reports that he does not use drugs.  Allergies  Allergen Reactions   Metformin     CKD   Family History  Problem Relation Age of Onset   Hypertension Mother        alive @ 7   Hyperlipidemia Mother    Hypertension Brother    Hyperlipidemia Brother    Hypertension Maternal Aunt    Hypertension Brother    Hyperlipidemia Brother    Hypertension Brother    Hyperlipidemia Brother    Diabetes Brother    Family history: Family history reviewed and not pertinent  Prior to Admission medications   Medication Sig Start Date End Date Taking? Authorizing Provider  apixaban (ELIQUIS) 5 MG TABS tablet Take 1 tablet (5 mg total) by mouth 2 (two) times daily. 12/06/21  Yes Dunn, Areta Haber, PA-C  citalopram (CELEXA) 10 MG tablet Take 10 mg by mouth daily.   Yes [provider]  furosemide (LASIX) 20 MG tablet Take 1 tablet (20 mg total) by mouth daily. 12/06/21 03/06/22 Yes Dunn, Ryan M, PA-C  glipiZIDE (GLUCOTROL XL) 10 MG 24 hr tablet Take 10 mg by mouth daily. 10/10/15  Yes [provider]  isosorbide  mononitrate (IMDUR) 30 MG 24 hr tablet Take 0.5 tablets (15 mg total) by mouth daily. 12/26/21 03/26/22 Yes Dunn, Areta Haber, PA-C  linagliptin (TRADJENTA) 5 MG TABS tablet Take 5 mg by mouth every morning.    Yes [provider]  lisinopril (ZESTRIL) 5 MG tablet Take 1 tablet (5 mg total) by mouth daily. 12/26/21 03/26/22 Yes Dunn, Areta Haber, PA-C  loratadine (CLARITIN) 10 MG tablet Take 10 mg by mouth daily as needed for allergies (seasonal allergies in the spring).    Yes [provider]  lovastatin (MEVACOR) 40 MG tablet Take 40 mg by mouth at bedtime.   Yes [provider]  metoprolol succinate (TOPROL XL) 25 MG 24 hr tablet Take 1 tablet (25 mg total) by mouth daily. 12/06/21  Yes Dunn, Areta Haber, PA-C  metoprolol tartrate (LOPRESSOR) 25 MG tablet TAKE 1 TABLET(25 MG) BY MOUTH TWICE DAILY 08/23/19  Yes Gollan, Kathlene November, MD  nitroGLYCERIN (NITROSTAT) 0.4 MG SL tablet Place 1 tablet (0.4 mg total) under the tongue every 5 (five) minutes  as needed for chest pain. 07/13/19  Yes Gollan, Kathlene November, MD  pregabalin (LYRICA) 100 MG capsule Take 100 mg by mouth 3 (three) times daily. 12/18/21  Yes [provider]  sertraline (ZOLOFT) 100 MG tablet Take 100 mg by mouth at bedtime.   Yes [provider]  traZODone (DESYREL) 150 MG tablet TAKE 1 TABLET BY MOUTH AT BEDTIME. 09/13/14  Yes Gollan, Kathlene November, MD  albuterol (PROVENTIL HFA;VENTOLIN HFA) 108 (90 BASE) MCG/ACT inhaler Inhale 2 puffs into the lungs every 4 (four) hours as needed for wheezing or shortness of breath.    [provider]   Physical Exam: Vitals:   01/31/22 1233 01/31/22 1238 01/31/22 1618 01/31/22 1918  BP: 118/65  104/68 (!) 143/74  Pulse: 64  80 (!) 57  Resp: 18  (!) 22 18  Temp: 97.6 F (36.4 C)  98.5 F (36.9 C)   TempSrc: Oral  Oral   SpO2: 96%  96% 100%  Weight:  (!) 148.8 kg    Height:  6' (1.829 m)     Constitutional: appears older than chronological age, chronically ill,, NAD, calm,  comfortable Eyes: PERRL, lids and conjunctivae normal ENMT: Mucous membranes are moist. Posterior pharynx clear of any exudate or lesions. Age-appropriate dentition. Hearing appropriate Neck: normal, supple, no masses, no thyromegaly Respiratory: clear to auscultation bilaterally, no wheezing, no crackles. Normal respiratory effort. No accessory muscle use.  Cardiovascular: Regular rate and rhythm, no murmurs / rubs / gallops. No extremity edema. 2+ pedal pulses. No carotid bruits.  Abdomen: Obese abdomen, no tenderness, no masses palpated, no hepatosplenomegaly. Bowel sounds positive.  Musculoskeletal: no clubbing / cyanosis. No joint deformity upper and lower extremities. Good ROM, no contractures, no atrophy. Normal muscle tone.  Skin: Bilateral lower extremity venous stasis, + ulcers and induration    Neurologic: Sensation intact. Strength 5/5 in all 4.  Psychiatric: Normal judgment and insight. Alert and oriented x 3. Normal mood.   EKG: independently reviewed, showing atrial flutter with rate of 69, QTc 555, left bundle branch block  x-rays on Admission: I personally reviewed and I agree with radiologist reading as below.  DG Foot Complete Left  Result Date: 01/31/2022 CLINICAL DATA:  Soft tissue swelling EXAM: LEFT FOOT - COMPLETE 3+ VIEW COMPARISON:  None Available. FINDINGS: No recent fracture or dislocation is seen. There are no focal lytic lesions. Plantar spur is seen in the calcaneus. Minimal bony spurs seen in first metatarsophalangeal joint. Bony spurs seen in the dorsal aspect of talonavicular joint. Small plantar spur is seen in the calcaneus. IMPRESSION: No fracture or dislocation is seen in the left foot. There are no focal lytic lesions. Other findings as described in the body of the report. Electronically Signed   By: Elmer Picker M.D.   On: 01/31/2022 16:56   DG Tibia/Fibula Left  Result Date: 01/31/2022 CLINICAL DATA:  Left lower leg edema for 1 week. EXAM: LEFT  TIBIA AND FIBULA - 2 VIEW COMPARISON:  None Available. FINDINGS: Status post plate and screw fixation of the lateral tibial plateau with intact hardware. No fracture or dislocation. No cortical erosion or periosteal reaction. Subcutaneous soft tissue edema and skin thickening suggesting cellulitis. IMPRESSION: 1.  No acute osseous abnormality. 2. Skin thickening and subcutaneous soft tissue edema suggesting cellulitis. Electronically Signed   By: Keane Police D.O.   On: 01/31/2022 16:55    Labs on Admission: I have personally reviewed following labs CBC: Recent Labs  Lab 01/31/22 1246  WBC 6.3  NEUTROABS 4.4  HGB 14.7  HCT 46.0  MCV 85.7  PLT 108*   Basic Metabolic Panel: Recent Labs  Lab 01/31/22 1246  NA 135  K 3.4*  CL 102  CO2 28  GLUCOSE 135*  BUN 10  CREATININE 1.25*  CALCIUM 8.6*   GFR: Estimated Creatinine Clearance: 89.6 mL/min (A) (by C-G formula based on SCr of 1.25 mg/dL (H)).  Liver Function Tests: Recent Labs  Lab 01/31/22 1246  AST 23  ALT 13  ALKPHOS 110  BILITOT 1.4*  PROT 7.7  ALBUMIN 3.0*   Coagulation Profile: Recent Labs  Lab 01/31/22 1246  INR 1.4*   Urine analysis:    Component Value Date/Time   COLORURINE YELLOW (A) 01/31/2022 1701   APPEARANCEUR CLEAR (A) 01/31/2022 1701   APPEARANCEUR Clear 09/04/2013 0121   LABSPEC 1.005 01/31/2022 1701   LABSPEC 1.008 09/04/2013 0121   PHURINE 5.0 01/31/2022 1701   GLUCOSEU 150 (A) 01/31/2022 1701   GLUCOSEU 150 mg/dL 29/51/8841 6606   HGBUR SMALL (A) 01/31/2022 1701   BILIRUBINUR NEGATIVE 01/31/2022 1701   BILIRUBINUR Negative 09/04/2013 0121   KETONESUR NEGATIVE 01/31/2022 1701   PROTEINUR NEGATIVE 01/31/2022 1701   NITRITE NEGATIVE 01/31/2022 1701   LEUKOCYTESUR NEGATIVE 01/31/2022 1701   LEUKOCYTESUR Negative 09/04/2013 0121   Dr. Sedalia Muta [facility]  If 7PM-7AM, please contact overnight-coverage provider If 7AM-7PM, please contact day coverage  provider [email or web address]  01/31/2022, 8:37 PM

## 2022-01-31 NOTE — Assessment & Plan Note (Addendum)
Poor wound healing with bilateral venous stasis - Cefepime and vancomycin - Check MRSA PCR - ABI of the lower extremities ordered - Admit to MedSurg, observation - A.m. team to consult vascular pending ABI read

## 2022-01-31 NOTE — ED Triage Notes (Signed)
Pt comes into the ED via EMS from home, pt has chronic swelling in his legs and today he was in the recliner taking a nap and when he woke up he noticed a puddle of blood on the floor and he had an open wound. Pt has noted blistering with discoloration to both lower legs. Denies injury.

## 2022-01-31 NOTE — Assessment & Plan Note (Signed)
-   This meets criteria for morbid obesity based on the presence of 1 or more chronic comorbidities. Patient has hypertension, hyperlipidemia, non-insulin-dependent IVs mellitus. This complicates overall care and prognosis.

## 2022-01-31 NOTE — Assessment & Plan Note (Signed)
-   Resumed home lisinopril 5 mg daily, isosorbide mononitrate 15 mg daily, metoprolol succinate 25 mg daily, furosemide 20 mg daily

## 2022-01-31 NOTE — Consult Note (Signed)
Pharmacy Antibiotic Note  [name] is a 65 y.o. male admitted on 01/31/2022 with  discoloration and blistering to both lower legs .  Pharmacy has been consulted for Vancomycin and Cefepime dosing.  Plan: Patient received Vancomycin 1g IV in ED. Will order additional 1500mg  to complete loading dose of 2500mg  total, then will order 2000 mg IV Q 24 hrs as maintenance. Goal AUC 400-550. Expected AUC: 457.3 Expected Cmin: 9.8 SCr used: 1.25, Vd used: 0.5  Cefepime 2g IV Q 8 hours   Height: 6' (182.9 cm) Weight: (!) 148.8 kg (328 lb) IBW/kg (Calculated) : 77.6  Temp (24hrs), Avg:98.1 F (36.7 C), Min:97.6 F (36.4 C), Max:98.5 F (36.9 C)  Recent Labs  Lab 01/31/22 1246  WBC 6.3  CREATININE 1.25*  LATICACIDVEN 1.6    Estimated Creatinine Clearance: 89.6 mL/min (A) (by C-G formula based on SCr of 1.25 mg/dL (H)).    Allergies  Allergen Reactions   Metformin     CKD    Antimicrobials this admission: Vanc 6/29 >>  Cefepime 6/29 >>   Dose adjustments this admission: N/a  Microbiology results: 6/29 BCx: pending  Thank you for allowing pharmacy to be a part of this patient's care.  7/29 01/31/2022 5:58 PM

## 2022-01-31 NOTE — ED Triage Notes (Signed)
Pt in via EMS from home with c/o leg edema.

## 2022-01-31 NOTE — ED Notes (Signed)
ED secretary calling transport team. 

## 2022-01-31 NOTE — ED Provider Notes (Signed)
[facility] Provider Note    Event Date/Time   First MD Initiated Contact with Patient 01/31/22 1524     (approximate)   History   Chief Complaint Edema   HPI  [name] is a 66 y.o. male with past medical history of hypertension, hyperlipidemia, diabetes, CAD, CHF, CKD, COPD, and atrial fibrillation on Eliquis who presents to the ED complaining of leg swelling and pain.  Patient reports that he has been dealing with intermittent bleeding from a wound to the anterior portion of his left lower leg.  Family was able to control bleeding yesterday but patient had another episode of bleeding after he woke up from a nap in his recliner today.  He has not noticed any purulent drainage from the leg, but does state he has noticed pain and swelling around his left great toe.  He states that there was a staple in his shoe about a week ago that he was unable to feel that seem to cause a wound in his toe.  Leg has been painful to walk on but he denies any swelling worse than usual.  He has not had any fevers, cough, chest pain, or shortness of breath.     Physical Exam   Triage Vital Signs: ED Triage Vitals  Enc Vitals Group     BP 01/31/22 1233 118/65     Pulse Rate 01/31/22 1233 64     Resp 01/31/22 1233 18     Temp 01/31/22 1233 97.6 F (36.4 C)     Temp Source 01/31/22 1233 Oral     SpO2 01/31/22 1233 96 %     Weight 01/31/22 1238 (!) 328 lb (148.8 kg)     Height 01/31/22 1238 6' (1.829 m)     Head Circumference --      Peak Flow --      Pain Score 01/31/22 1238 6     Pain Loc --      Pain Edu? --      Excl. in GC? --     Most recent vital signs: Vitals:   01/31/22 1233 01/31/22 1618  BP: 118/65 104/68  Pulse: 64 80  Resp: 18 (!) 22  Temp: 97.6 F (36.4 C) 98.5 F (36.9 C)  SpO2: 96% 96%    Constitutional: Alert and oriented. Eyes: Conjunctivae are normal. Head: Atraumatic. Nose: No congestion/rhinnorhea. Mouth/Throat: Mucous membranes  are moist.  Cardiovascular: Normal rate, regular rhythm. Grossly normal heart sounds.  2+ radial and DP pulses bilaterally. Respiratory: Normal respiratory effort.  No retractions. Lungs CTAB. Gastrointestinal: Soft and nontender. No distention. Musculoskeletal: Wound to anterior left lower leg with surrounding tenderness to palpation and with dried blood, no purulence noted.  Erythema, edema, and warmth of left great toe with small wound noted to medial portion of toe, no focal fluctuance noted. Neurologic:  Normal speech and language. No gross focal neurologic deficits are appreciated.    ED Results / Procedures / Treatments   Labs (all labs ordered are listed, but only abnormal results are displayed) Labs Reviewed  COMPREHENSIVE METABOLIC PANEL - Abnormal; Notable for the following components:      Result Value   Potassium 3.4 (*)    Glucose, Bld 135 (*)    Creatinine, Ser 1.25 (*)    Calcium 8.6 (*)    Albumin 3.0 (*)    Total Bilirubin 1.4 (*)    All other components within normal limits  CBC WITH DIFFERENTIAL/PLATELET - Abnormal; Notable for  the following components:   RDW 15.8 (*)    Platelets 108 (*)    All other components within normal limits  PROTIME-INR - Abnormal; Notable for the following components:   Prothrombin Time 16.8 (*)    INR 1.4 (*)    All other components within normal limits  URINALYSIS, ROUTINE W REFLEX MICROSCOPIC - Abnormal; Notable for the following components:   Color, Urine YELLOW (*)    APPearance CLEAR (*)    Glucose, UA 150 (*)    Hgb urine dipstick SMALL (*)    All other components within normal limits  CULTURE, BLOOD (ROUTINE X 2)  CULTURE, BLOOD (ROUTINE X 2)  LACTIC ACID, PLASMA  LACTIC ACID, PLASMA  HIV ANTIBODY (ROUTINE TESTING W REFLEX)  BASIC METABOLIC PANEL  CBC     EKG  ED ECG REPORT I, Chesley Noon, the attending physician, personally viewed and interpreted this ECG.   Date: 01/31/2022  EKG Time: 12:39  Rate: 69   Rhythm: Atrial flutter  Axis: LAD  Intervals:left bundle branch block  ST&T Change: None  RADIOLOGY Left leg x-ray reviewed and interpreted by me with no bony changes concerning for osteomyelitis, soft tissue edema noted.  PROCEDURES:  Critical Care performed: No  Procedures   MEDICATIONS ORDERED IN ED: Medications  ceFEPIme (MAXIPIME) 1 g in sodium chloride 0.9 % 100 mL IVPB (1 g Intravenous New Bag/Given 01/31/22 1731)  vancomycin (VANCOCIN) IVPB 1000 mg/200 mL premix (1,000 mg Intravenous New Bag/Given 01/31/22 1734)  pravastatin (PRAVACHOL) tablet 40 mg (has no administration in time range)  enoxaparin (LOVENOX) injection 40 mg (has no administration in time range)  acetaminophen (TYLENOL) tablet 650 mg (has no administration in time range)    Or  acetaminophen (TYLENOL) suppository 650 mg (has no administration in time range)  senna-docusate (Senokot-S) tablet 1 tablet (has no administration in time range)  ondansetron (ZOFRAN) tablet 4 mg (has no administration in time range)    Or  ondansetron (ZOFRAN) injection 4 mg (has no administration in time range)  nicotine (NICODERM CQ - dosed in mg/24 hours) patch 14 mg (has no administration in time range)  vancomycin (VANCOREADY) IVPB 1500 mg/300 mL (has no administration in time range)  ceFEPIme (MAXIPIME) 2 g in sodium chloride 0.9 % 100 mL IVPB (has no administration in time range)  oxyCODONE-acetaminophen (PERCOCET/ROXICET) 5-325 MG per tablet 1 tablet (1 tablet Oral Given 01/31/22 1616)     IMPRESSION / MDM / ASSESSMENT AND PLAN / ED COURSE  I reviewed the triage vital signs and the nursing notes.                              66 y.o. male with past medical history of hypertension, hyperlipidemia, diabetes, CAD, CHF, CKD, atrial fibrillation on Eliquis, and COPD who presents to the ED from pain in his left lower leg along with bleeding from wound to the anterior portion of his lower leg.  Patient's presentation is most  consistent with acute presentation with potential threat to life or bodily function.  Differential diagnosis includes, but is not limited to, cellulitis, abscess, diabetic ulcer, DVT, venous stasis dermatitis, osteomyelitis, peripheral vascular disease.  Patient nontoxic-appearing and in no acute distress, vital signs are unremarkable and he is neurovascular intact to his left lower extremity.  There is no significant edema beyond his baseline and he has no calf tenderness to suggest DVT.  He does have tenderness around a wound to  his anterior left lower leg with no obvious signs of infection.  I am primarily concerned with infection affecting his left great toe, which is erythematous and edematous with wound to the medial portion.  We will further assess with x-ray for evidence of osteomyelitis but patient would likely benefit from admission for IV antibiotics.  Labs thus far are reassuring with no significant anemia or leukocytosis, renal function comparable to previous with no significant electrolyte abnormality.  LFTs are within normal limits.  No bony changes noted on x-rays of left lower leg or left foot concerning for osteomyelitis, however there are findings concerning for cellulitis.  Patient started on cefepime and vancomycin due to concern for possible osteomyelitis.  Case discussed with hospitalist for admission.      FINAL CLINICAL IMPRESSION(S) / ED DIAGNOSES   Final diagnoses:  Left leg cellulitis     Rx / DC Orders   ED Discharge Orders     None        Note:  This document was prepared using Dragon voice recognition software and may include unintentional dictation errors.   Chesley Noon, MD 01/31/22 1758

## 2022-01-31 NOTE — Assessment & Plan Note (Signed)
-   Resumed home metoprolol succinate, apixaban 5 mg p.o. twice daily

## 2022-01-31 NOTE — Assessment & Plan Note (Addendum)
-   Imdur, statin, ACEi resumed

## 2022-01-31 NOTE — Assessment & Plan Note (Signed)
-   Home glipizide and Tradjenta has not been resumed while inpatient - Insulin SSI with at bedtime coverage ordered

## 2022-01-31 NOTE — Assessment & Plan Note (Signed)
-   As needed nicotine patch ordered ?

## 2022-02-01 ENCOUNTER — Inpatient Hospital Stay: Payer: Medicare Other

## 2022-02-01 DIAGNOSIS — Z7984 Long term (current) use of oral hypoglycemic drugs: Secondary | ICD-10-CM | POA: Diagnosis not present

## 2022-02-01 DIAGNOSIS — I255 Ischemic cardiomyopathy: Secondary | ICD-10-CM | POA: Diagnosis not present

## 2022-02-01 DIAGNOSIS — E876 Hypokalemia: Secondary | ICD-10-CM | POA: Diagnosis not present

## 2022-02-01 DIAGNOSIS — Z7901 Long term (current) use of anticoagulants: Secondary | ICD-10-CM | POA: Diagnosis not present

## 2022-02-01 DIAGNOSIS — J449 Chronic obstructive pulmonary disease, unspecified: Secondary | ICD-10-CM | POA: Diagnosis not present

## 2022-02-01 DIAGNOSIS — N1831 Chronic kidney disease, stage 3a: Secondary | ICD-10-CM | POA: Diagnosis not present

## 2022-02-01 DIAGNOSIS — I83029 Varicose veins of left lower extremity with ulcer of unspecified site: Secondary | ICD-10-CM | POA: Diagnosis not present

## 2022-02-01 DIAGNOSIS — I5042 Chronic combined systolic (congestive) and diastolic (congestive) heart failure: Secondary | ICD-10-CM | POA: Diagnosis not present

## 2022-02-01 DIAGNOSIS — E11628 Type 2 diabetes mellitus with other skin complications: Secondary | ICD-10-CM | POA: Diagnosis not present

## 2022-02-01 DIAGNOSIS — I2510 Atherosclerotic heart disease of native coronary artery without angina pectoris: Secondary | ICD-10-CM | POA: Diagnosis not present

## 2022-02-01 DIAGNOSIS — L03116 Cellulitis of left lower limb: Secondary | ICD-10-CM | POA: Diagnosis present

## 2022-02-01 DIAGNOSIS — I890 Lymphedema, not elsewhere classified: Secondary | ICD-10-CM | POA: Diagnosis not present

## 2022-02-01 DIAGNOSIS — I420 Dilated cardiomyopathy: Secondary | ICD-10-CM | POA: Diagnosis not present

## 2022-02-01 DIAGNOSIS — I130 Hypertensive heart and chronic kidney disease with heart failure and stage 1 through stage 4 chronic kidney disease, or unspecified chronic kidney disease: Secondary | ICD-10-CM | POA: Diagnosis not present

## 2022-02-01 DIAGNOSIS — I482 Chronic atrial fibrillation, unspecified: Secondary | ICD-10-CM | POA: Diagnosis not present

## 2022-02-01 DIAGNOSIS — I447 Left bundle-branch block, unspecified: Secondary | ICD-10-CM | POA: Diagnosis not present

## 2022-02-01 DIAGNOSIS — F32A Depression, unspecified: Secondary | ICD-10-CM | POA: Diagnosis not present

## 2022-02-01 DIAGNOSIS — L089 Local infection of the skin and subcutaneous tissue, unspecified: Secondary | ICD-10-CM | POA: Diagnosis not present

## 2022-02-01 DIAGNOSIS — E7849 Other hyperlipidemia: Secondary | ICD-10-CM | POA: Diagnosis not present

## 2022-02-01 DIAGNOSIS — Z7982 Long term (current) use of aspirin: Secondary | ICD-10-CM | POA: Diagnosis not present

## 2022-02-01 DIAGNOSIS — L97929 Non-pressure chronic ulcer of unspecified part of left lower leg with unspecified severity: Secondary | ICD-10-CM | POA: Diagnosis not present

## 2022-02-01 DIAGNOSIS — E1140 Type 2 diabetes mellitus with diabetic neuropathy, unspecified: Secondary | ICD-10-CM | POA: Diagnosis not present

## 2022-02-01 DIAGNOSIS — E1122 Type 2 diabetes mellitus with diabetic chronic kidney disease: Secondary | ICD-10-CM | POA: Diagnosis not present

## 2022-02-01 DIAGNOSIS — F17210 Nicotine dependence, cigarettes, uncomplicated: Secondary | ICD-10-CM | POA: Diagnosis not present

## 2022-02-01 DIAGNOSIS — D751 Secondary polycythemia: Secondary | ICD-10-CM | POA: Diagnosis not present

## 2022-02-01 LAB — BASIC METABOLIC PANEL
Anion gap: 6 (ref 5–15)
BUN: 9 mg/dL (ref 8–23)
CO2: 27 mmol/L (ref 22–32)
Calcium: 8.4 mg/dL — ABNORMAL LOW (ref 8.9–10.3)
Chloride: 106 mmol/L (ref 98–111)
Creatinine, Ser: 1.19 mg/dL (ref 0.61–1.24)
GFR, Estimated: >60 mL/min (ref >60)
Glucose, Bld: 112 mg/dL — ABNORMAL HIGH (ref 70–99)
Potassium: 3.4 mmol/L — ABNORMAL LOW (ref 3.5–5.1)
Sodium: 139 mmol/L (ref 135–145)

## 2022-02-01 LAB — GLUCOSE, CAPILLARY
Glucose-Capillary: 141 mg/dL — ABNORMAL HIGH (ref 70–99)
Glucose-Capillary: 137 mg/dL — ABNORMAL HIGH (ref 70–99)
Glucose-Capillary: 110 mg/dL — ABNORMAL HIGH (ref 70–99)
Glucose-Capillary: 145 mg/dL — ABNORMAL HIGH (ref 70–99)

## 2022-02-01 LAB — CBC
HCT: 42.9 % (ref 39.0–52.0)
Hemoglobin: 14 g/dL (ref 13.0–17.0)
MCH: 27.7 pg (ref 26.0–34.0)
MCHC: 32.6 g/dL (ref 30.0–36.0)
MCV: 84.8 fL (ref 80.0–100.0)
Platelets: 91 10*3/uL — ABNORMAL LOW (ref 150–400)
RBC: 5.06 MIL/uL (ref 4.22–5.81)
RDW: 15.4 % (ref 11.5–15.5)
WBC: 5.7 10*3/uL (ref 4.0–10.5)
nRBC: 0 % (ref 0.0–0.2)

## 2022-02-01 LAB — MRSA NEXT GEN BY PCR, NASAL: MRSA by PCR Next Gen: NOT DETECTED

## 2022-02-01 LAB — MAGNESIUM: Magnesium: 1.9 mg/dL (ref 1.7–2.4)

## 2022-02-01 MED ORDER — SODIUM CHLORIDE 0.9 % IV SOLN
2.0000 g | Freq: Three times a day (TID) | INTRAVENOUS | Status: DC
Start: 2022-02-01 — End: 2022-02-03
  Administered 2022-02-01 – 2022-02-03 (×6): 2 g via INTRAVENOUS
  Filled 2022-02-01 (×3): qty 2
  Filled 2022-02-01: qty 12.5
  Filled 2022-02-01 (×2): qty 2
  Filled 2022-02-01: qty 12.5
  Filled 2022-02-01: qty 2

## 2022-02-01 MED ORDER — POTASSIUM CHLORIDE 20 MEQ PO PACK
40.0000 meq | PACK | ORAL | Status: AC
  Administered 2022-02-01 (×2): 40 meq via ORAL
  Filled 2022-02-01 (×2): qty 2

## 2022-02-01 NOTE — Care Management (Signed)
RNCM attempted to speak with patient, care being given, was told to return later in the afternoon.

## 2022-02-01 NOTE — Progress Notes (Signed)
  Progress Note   Patient: [name] YBO:[id number] DOB: 11/07/1955 DOA: 01/31/2022     0 DOS: the patient was seen and examined on 02/01/2022   Brief hospital course: Mr. Evann Erazo is a 66 year old male with hypertension, non-insulin-dependent diabetes mellitus, hyperlipidemia, depression, neuropathy, who presents emergency department for chief concerns of left leg worsening wound. Patient is diagnosed with left leg cellulitis, started on antibiotics with cefepime and vancomycin. ABI did not show significant arterial occlusion.  Assessment and Plan: Left leg cellulitis with chronic leg wound infection Chronic leg lymphedema. ABI did not show significant peripheral arterial disease. Blood cultures so far had no growth. Continue antibiotics with vancomycin and cefepime. Obtain wound care consult.  Chronic atrial fibrillation. Rate under control, continue metoprolol and apixaban.  Type 2 diabetes. Continue sliding scale insulin, hold off oral diabetic medicine.  Coronary disease. Stable.  Morbid obesity. Diet exercise.  Hypokalemia. Repleted. Magnesium level normal      Subjective:  Patient complaining left leg pain, no fever or chills  Physical Exam: Vitals:   01/31/22 1618 01/31/22 1918 02/01/22 0500 02/01/22 0824  BP: 104/68 (!) 143/74 127/69 136/80  Pulse: 80 (!) 57 (!) 56 (!) 58  Resp: (!) 22 18 20 16   Temp: 98.5 F (36.9 C)  98 F (36.7 C) 98 F (36.7 C)  TempSrc: Oral     SpO2: 96% 100% 98% 96%  Weight:      Height:       General exam: Appears calm and comfortable  Respiratory system: Clear to auscultation. Respiratory effort normal. Cardiovascular system: S1 & S2 heard, RRR. No JVD, murmurs, rubs, gallops or clicks. No pedal edema. Gastrointestinal system: Abdomen is nondistended, soft and nontender. No organomegaly or masses felt. Normal bowel sounds heard. Central nervous system: Alert and oriented. No focal neurological  deficits. Extremities: Left leg significant swelling, with chronic changes and ulceration.  Also red and tender to touch. Skin: No rashes, lesions or ulcers Psychiatry: Judgement and insight appear normal. Mood & affect appropriate.   Data Reviewed:  ABI reviewed, lab results reviewed.  Family Communication:   Disposition: Status is: Inpatient Remains inpatient appropriate because: Severity of disease, IV antibiotics.  Planned Discharge Destination: Home with Home Health    Time spent: 35 minutes  Author: , MD 02/01/2022 12:25 PM  For on call review [email or web address].

## 2022-02-01 NOTE — TOC Initial Note (Addendum)
Transition of Care [facility]) - Initial/Assessment Note    Patient Details  Name: [name] MRN: [id number] Date of Birth: Jul 09, 1956  [facility] of [facility]) CM/SW Contact:    Caryn Section, RN Phone Number: 02/01/2022, 3:44 PM  Clinical Narrative:      Patient lives at home with girlfriend.  He does not have home health, and states that the only DME he has is a walker.   Patient receives some medications from the local pharmacy, and some he receives from mail order.  He has no medication concerns at this time.  Patient denies any concerns with transportation to appointments, and is current with [facility].  Patient states he has no TOC needs at this time.  TOC contact information provided if needs arise during stay.            Addendum 1617:  Patient will have dressing changes, and has yet to be seen by PT/OT for mobility and needs assessment.  Expected Discharge Plan:  (tbd) Barriers to Discharge: Continued Medical Work up   Patient Goals and CMS Choice        Expected Discharge Plan and Services Expected Discharge Plan:  (tbd)   Discharge Planning Services: CM Consult                                          Prior Living Arrangements/Services     Patient language and need for interpreter reviewed:: Yes (no interpreter required) Do you feel safe going back to the place where you live?: Yes      Need for Family Participation in Patient Care: Yes (Comment) Care giver support system in place?: Yes (comment) Current home services: DME (pt has walker at home) Criminal Activity/Legal Involvement Pertinent to Current Situation/Hospitalization: No - Comment as needed  Activities of Daily Living Home Assistive Devices/Equipment: None ADL Screening (condition at time of admission) Patient's cognitive ability adequate to safely complete daily activities?: Yes Is the patient deaf or have difficulty hearing?: No Does the patient have difficulty seeing, even  when wearing glasses/contacts?: No Does the patient have difficulty concentrating, remembering, or making decisions?: No Patient able to express need for assistance with ADLs?: Yes Does the patient have difficulty dressing or bathing?: No Independently performs ADLs?: Yes (appropriate for developmental age) Does the patient have difficulty walking or climbing stairs?: Yes Weakness of Legs: Both Weakness of Arms/Hands: Left  Permission Sought/Granted Permission sought to share information with : Case Manager Permission granted to share information with : Yes, Verbal Permission Granted              Emotional Assessment Appearance:: Appears stated age Attitude/Demeanor/Rapport: Gracious Affect (typically observed): Appropriate Orientation: : Oriented to Self, Oriented to Place, Oriented to  Time, Oriented to Situation Alcohol / Substance Use: Not Applicable Psych Involvement: No (comment)  Admission diagnosis:  Left leg cellulitis [L03.116] Cellulitis of left anterior lower leg [L03.116] Patient Active Problem List   Diagnosis Date Noted   Left leg cellulitis 02/01/2022   Hypokalemia 02/01/2022   Cellulitis of left anterior lower leg 01/31/2022   Atrial fibrillation, chronic ([name]) 01/31/2022   Chronic systolic CHF (congestive heart failure) ([name]) 12/25/2021   Collagen vascular disease ([name]) 12/25/2021   COPD (chronic obstructive pulmonary disease) ([name]) 12/25/2021   Coronary artery disease, non-occlusive 12/25/2021   Ischemic cardiomyopathy 12/25/2021   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   Tobacco abuse  12/25/2021   Cellulitis of left leg 12/17/2015   Cellulitis 12/17/2015   Diabetic infection of left foot ([name]) 02/03/2015   HTN (hypertension) 01/17/2015   Peripheral neuropathy 01/17/2015   CKD (chronic kidney disease), stage III ([name]) 01/17/2015   Chronic combined systolic and diastolic CHF (congestive heart failure) ([name]) 01/17/2015   Cellulitis and abscess of foot  01/17/2015   Cellulitis and abscess of foot excluding toe 01/17/2015   Essential hypertension 01/17/2015   Hereditary and idiopathic peripheral neuropathy 01/17/2015   CAD (coronary artery disease) 01/07/2014   Coronary atherosclerosis 01/07/2014   SOB (shortness of breath) 11/16/2013   Chest pain 11/16/2013   Morbid obesity ([name]) 11/16/2013   Hyperlipidemia 11/16/2013   Diabetes mellitus, type 2 ([name]) 11/16/2013   Smoker 11/16/2013   Left bundle branch block 11/16/2013   Ischemic toe 11/16/2013   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 Pharmacy:   [id number] [place], [place] - [address] AT [facility] OF [address] [place] [address] [place] [address] Phone: [phone] Fax: [phone]     Social Determinants of Health (SDOH) Interventions    Readmission Risk Interventions     No data to display

## 2022-02-01 NOTE — Consult Note (Signed)
WOC Nurse Consult Note: Reason for Consult: evaluate LE wound for care Wound type: venous ulceration; questionable lymphedema based on skin presentation  Pressure Injury POA: NA Measurement: Wound bed: two areas note, left great toe with partial thickness wound noted to be related to object in the patient's shoe and an insensate foot. Full thickness wound left lateral leg with cratered areas consistent with brawny edema and skin changes with chronic venous stasis  Drainage (amount, consistency, odor) bloody  Periwound: hemosiderin staining  Dressing procedure/placement/frequency:  ABI ordered, will await results- RLE 1.2/LLE 1.2   Apply calcium alginate to the LLE lateral leg wound for absorption and hemostatic properties. Apply Unna's boot for compression therapy.  Apply xeroform to the LLE great toe wound, top with dry dressing.     Noheli Melder [facility], CNS, [employer] [phone]

## 2022-02-02 DIAGNOSIS — L089 Local infection of the skin and subcutaneous tissue, unspecified: Secondary | ICD-10-CM | POA: Diagnosis not present

## 2022-02-02 DIAGNOSIS — N1831 Chronic kidney disease, stage 3a: Secondary | ICD-10-CM | POA: Diagnosis not present

## 2022-02-02 DIAGNOSIS — J449 Chronic obstructive pulmonary disease, unspecified: Secondary | ICD-10-CM | POA: Diagnosis not present

## 2022-02-02 DIAGNOSIS — F17210 Nicotine dependence, cigarettes, uncomplicated: Secondary | ICD-10-CM | POA: Diagnosis not present

## 2022-02-02 DIAGNOSIS — I2510 Atherosclerotic heart disease of native coronary artery without angina pectoris: Secondary | ICD-10-CM | POA: Diagnosis not present

## 2022-02-02 DIAGNOSIS — E1140 Type 2 diabetes mellitus with diabetic neuropathy, unspecified: Secondary | ICD-10-CM | POA: Diagnosis not present

## 2022-02-02 DIAGNOSIS — E7849 Other hyperlipidemia: Secondary | ICD-10-CM | POA: Diagnosis not present

## 2022-02-02 DIAGNOSIS — L03116 Cellulitis of left lower limb: Secondary | ICD-10-CM | POA: Diagnosis present

## 2022-02-02 DIAGNOSIS — I130 Hypertensive heart and chronic kidney disease with heart failure and stage 1 through stage 4 chronic kidney disease, or unspecified chronic kidney disease: Secondary | ICD-10-CM | POA: Diagnosis not present

## 2022-02-02 DIAGNOSIS — L97929 Non-pressure chronic ulcer of unspecified part of left lower leg with unspecified severity: Secondary | ICD-10-CM | POA: Diagnosis not present

## 2022-02-02 DIAGNOSIS — E876 Hypokalemia: Secondary | ICD-10-CM | POA: Diagnosis not present

## 2022-02-02 DIAGNOSIS — E11628 Type 2 diabetes mellitus with other skin complications: Secondary | ICD-10-CM

## 2022-02-02 DIAGNOSIS — Z7984 Long term (current) use of oral hypoglycemic drugs: Secondary | ICD-10-CM | POA: Diagnosis not present

## 2022-02-02 DIAGNOSIS — I447 Left bundle-branch block, unspecified: Secondary | ICD-10-CM | POA: Diagnosis not present

## 2022-02-02 DIAGNOSIS — F32A Depression, unspecified: Secondary | ICD-10-CM | POA: Diagnosis not present

## 2022-02-02 DIAGNOSIS — I890 Lymphedema, not elsewhere classified: Secondary | ICD-10-CM | POA: Diagnosis not present

## 2022-02-02 DIAGNOSIS — I83029 Varicose veins of left lower extremity with ulcer of unspecified site: Secondary | ICD-10-CM | POA: Diagnosis not present

## 2022-02-02 DIAGNOSIS — E1122 Type 2 diabetes mellitus with diabetic chronic kidney disease: Secondary | ICD-10-CM | POA: Diagnosis not present

## 2022-02-02 DIAGNOSIS — I5042 Chronic combined systolic (congestive) and diastolic (congestive) heart failure: Secondary | ICD-10-CM | POA: Diagnosis not present

## 2022-02-02 DIAGNOSIS — Z7901 Long term (current) use of anticoagulants: Secondary | ICD-10-CM | POA: Diagnosis not present

## 2022-02-02 DIAGNOSIS — I482 Chronic atrial fibrillation, unspecified: Secondary | ICD-10-CM | POA: Diagnosis not present

## 2022-02-02 DIAGNOSIS — I420 Dilated cardiomyopathy: Secondary | ICD-10-CM | POA: Diagnosis not present

## 2022-02-02 DIAGNOSIS — D751 Secondary polycythemia: Secondary | ICD-10-CM | POA: Diagnosis not present

## 2022-02-02 DIAGNOSIS — Z7982 Long term (current) use of aspirin: Secondary | ICD-10-CM | POA: Diagnosis not present

## 2022-02-02 DIAGNOSIS — I255 Ischemic cardiomyopathy: Secondary | ICD-10-CM | POA: Diagnosis not present

## 2022-02-02 LAB — BASIC METABOLIC PANEL
Anion gap: 5 (ref 5–15)
BUN: 14 mg/dL (ref 8–23)
CO2: 25 mmol/L (ref 22–32)
Calcium: 8.8 mg/dL — ABNORMAL LOW (ref 8.9–10.3)
Chloride: 105 mmol/L (ref 98–111)
Creatinine, Ser: 1.25 mg/dL — ABNORMAL HIGH (ref 0.61–1.24)
GFR, Estimated: >60 mL/min (ref >60)
Glucose, Bld: 137 mg/dL — ABNORMAL HIGH (ref 70–99)
Potassium: 3.9 mmol/L (ref 3.5–5.1)
Sodium: 135 mmol/L (ref 135–145)

## 2022-02-02 LAB — GLUCOSE, CAPILLARY
Glucose-Capillary: 111 mg/dL — ABNORMAL HIGH (ref 70–99)
Glucose-Capillary: 107 mg/dL — ABNORMAL HIGH (ref 70–99)
Glucose-Capillary: 105 mg/dL — ABNORMAL HIGH (ref 70–99)
Glucose-Capillary: 124 mg/dL — ABNORMAL HIGH (ref 70–99)

## 2022-02-02 LAB — HEMOGLOBIN A1C
Hgb A1c MFr Bld: 5.9 % — ABNORMAL HIGH (ref 4.8–5.6)
Mean Plasma Glucose: 123 mg/dL

## 2022-02-02 NOTE — Progress Notes (Addendum)
  Progress Note   Patient: [name] [id number]:[id number] DOB: 12/22/1955 DOA: 01/31/2022     1 DOS: the patient was seen and examined on 02/02/2022   Brief hospital course: Mr. Aly Hauser is a 66 year old male with hypertension, non-insulin-dependent diabetes mellitus, hyperlipidemia, depression, neuropathy, who presents emergency department for chief concerns of left leg worsening wound. Patient is diagnosed with left leg cellulitis, started on antibiotics with cefepime and vancomycin. ABI did not show significant arterial occlusion.  Assessment and Plan:  Left leg cellulitis with chronic leg wound infection Chronic leg lymphedema. ABI did not show significant peripheral arterial disease.  Blood culture no growth for 2 days. Continue vancomycin and cefepime for another day, changed to oral antibiotic tomorrow. Patient has been followed by wound care.   Chronic atrial fibrillation. No change in treatment, continue metoprolol and apixaban.   Type 2 diabetes. On sliding scale insulin.   Coronary disease. Stable.  Morbid obesity. Diet exercise.   Hypokalemia. Chronic kidney disease stage IIIa. Potassium normalized, renal function stable.  Discharge planning. PT/OT recommended nursing home placement.  Patient so far refused it.  Planning discharge to home with PT/OT tomorrow.      Subjective:  Patient doing better today, he was able to ambulate with physical therapy.  Physical Exam: Vitals:   02/01/22 1618 02/01/22 2051 02/02/22 0501 02/02/22 0804  BP: 129/63 112/67 124/67 (!) 156/85  Pulse: (!) 56 (!) 45 66 (!) 48  Resp:  17 19 18   Temp: 97.6 F (36.4 C) 97.7 F (36.5 C) 97.7 F (36.5 C) 98 F (36.7 C)  TempSrc: Oral     SpO2: 98% 96% 99% 100%  Weight:      Height:       General exam: Appears calm and comfortable  Respiratory system: Clear to auscultation. Respiratory effort normal. Cardiovascular system: Irregular. No JVD, murmurs, rubs, gallops or  clicks. No pedal edema. Gastrointestinal system: Abdomen is nondistended, soft and nontender. No organomegaly or masses felt. Normal bowel sounds heard. Central nervous system: Alert and oriented. No focal neurological deficits. Extremities: Left leg ulcer, redness better Skin: No rashes, lesions or ulcers Psychiatry: Judgement and insight appear normal. Mood & affect appropriate.   Data Reviewed:  Lab results reviewed.  Family Communication: Not needed to talk to family per patient  Disposition: Status is: Inpatient Remains inpatient appropriate because: Severity of disease, IV treatment.  Planned Discharge Destination: Home with Home Health    Time spent: 35 minutes  Author: , MD 02/02/2022 10:28 AM  For on call review [email or web address].

## 2022-02-02 NOTE — Evaluation (Signed)
Occupational Therapy Evaluation Patient Details Name: [name] MRN: [id number] DOB: Apr 09, 1956 Today's Date: 02/02/2022   History of Present Illness Pt is a 66 y/o M admitted on 01/31/22 after presenting with c/o LLE worsening wound. Pt is being treated for LLE cellulitis. PMH: HTN, NIDDM, HLD, depression, neuropathy   Clinical Impression   Pt seen for OT evaluation this date. Prior to hospital admission, pt required MIN A for his significant other for LB dressing primarily to don socks. Pt lives with his girlfriend spouse in a 1 story home with 5 steps to enter and bilateral hand rails. Currently pt reporting baseline level of functional independence during ADL management in his hospital room Pt demonstrates baseline independence to perform ADL and mobility tasks and no strength, sensory, cognitive, or visual deficits appreciated with assessment. Pt educated on safe use of AE for LB ADL management. No additional skilled OT needs identified. Will sign off. Please re-consult if additional OT needs arise.       Recommendations for follow up therapy are one component of a multi-disciplinary discharge planning process, led by the attending physician.  Recommendations may be updated based on patient status, additional functional criteria and insurance authorization.   Follow Up Recommendations  No OT follow up    Assistance Recommended at Discharge PRN  Patient can return home with the following      Functional Status Assessment     Equipment Recommendations  None recommended by OT    Recommendations for Other Services       Precautions / Restrictions Precautions Precautions: None Restrictions Weight Bearing Restrictions: No      Mobility Bed Mobility               General bed mobility comments: not observed, pt received & left sitting EOB    Transfers Overall transfer level: Independent Equipment used: None               General transfer comment: STS  without AD      Balance Overall balance assessment: Needs assistance, History of Falls Sitting-balance support: Feet supported Sitting balance-Leahy Scale: Good Sitting balance - Comments: steady static sitting, reching outside BOS.   Standing balance support: During functional activity, No upper extremity supported Standing balance-Leahy Scale: Good                             ADL either performed or assessed with clinical judgement   ADL Overall ADL's : At baseline                                       General ADL Comments: MIN A to doff/don hospital sock on his RLE which pt reports is his functional baseline.     Vision Ability to See in Adequate Light: 0 Adequate Patient Visual Report: No change from baseline       Perception     Praxis      Pertinent Vitals/Pain Pain Assessment Pain Assessment: No/denies pain     Hand Dominance Right   Extremity/Trunk Assessment Upper Extremity Assessment Upper Extremity Assessment: Overall WFL for tasks assessed   Lower Extremity Assessment Lower Extremity Assessment: Overall WFL for tasks assessed   Cervical / Trunk Assessment Cervical / Trunk Assessment: Normal   Communication Communication Communication: No difficulties   Cognition Arousal/Alertness: Awake/alert Behavior During Therapy: WFL for tasks assessed/performed  Overall Cognitive Status: Within Functional Limits for tasks assessed                                 General Comments: Pleasant, conversational.     General Comments     Exercises Other Exercises Other Exercises: Pt/ family educated on role of OT in acute setting, safe use of AE for improved LB ADL management, and falls prevention strategies.   Shoulder Instructions      Home Living Family/patient expects to be discharged to:: Private residence Living Arrangements: Spouse/significant other Available Help at Discharge: Family;Available  PRN/intermittently;Available 24 hours/day Type of Home: House Home Access: Stairs to enter [employer] of Steps: 5 Entrance Stairs-Rails: Right;Can reach both;Left Home Layout: One level               Home Equipment: Standard Walker;Electric scooter          Prior Functioning/Environment Prior Level of Function : Independent/Modified Independent             Mobility Comments: Does not drive, ambulatory without AD, reports he has had 2 falls in past 6 months + 2 instances of rolling OOB. ADLs Comments: Requires MIN A for LB dressing at baseline from SO. Has difficulty reaching his R foot 2/2 baseline hip issues.        OT Problem List: Decreased safety awareness;Decreased knowledge of use of DME or AE;Decreased range of motion      OT Treatment/Interventions:      OT Goals(Current goals can be found in the care plan section) Acute Rehab OT Goals Patient Stated Goal: To go home OT Goal Formulation: All assessment and education complete, DC therapy Time For Goal Achievement: 02/02/22 Potential to Achieve Goals: Good  OT Frequency:      Co-evaluation              AM-PAC OT "6 Clicks" Daily Activity     Outcome Measure Help from another person eating meals?: None Help from another person taking care of personal grooming?: None Help from another person toileting, which includes using toliet, bedpan, or urinal?: None Help from another person bathing (including washing, rinsing, drying)?: None Help from another person to put on and taking off regular upper body clothing?: None Help from another person to put on and taking off regular lower body clothing?: A Little 6 Click Score: 23   End of Session Equipment Utilized During Treatment:  (LH reacher, sock aid)  Activity Tolerance: Patient tolerated treatment well Patient left: Other (comment);with family/visitor present (Sitting EOB)  OT Visit Diagnosis: Other abnormalities of gait and mobility  (R26.89)                Time: 1610-9604 OT Time Calculation (min): 20 min Charges:  OT General Charges $OT Visit: 1 Visit OT Evaluation $OT Eval Low Complexity: 1 Low OT Treatments $Self Care/Home Management : 8-22 mins  Rockney Ghee, M.S., OTR/L Ascom: [phone] 02/02/22, 1:58 PM

## 2022-02-02 NOTE — Evaluation (Signed)
Physical Therapy Evaluation Patient Details Name: [name] MRN: [id number] DOB: [birth year] Today's Date: 02/02/2022  History of Present Illness  Pt is a 66 y/o M admitted on 01/31/22 after presenting with c/o LLE worsening wound. Pt is being treated for LLE cellulitis. PMH: HTN, NIDDM, HLD, depression, neuropathy  Clinical Impression  Pt seen for PT evaluation with pt agreeable. Pt reports he lives with his girlfriend in a 1 level home with 5 steps with B rails to enter, ambulates without AD, but endorses 2 falls in past 6 months. On this date, pt completes STS transfer independently without AD, ambulates ~200 ft independently without AD, & negotiates 4 steps with B rails & mod I. Pt does not demonstrate any acute PT needs at this time. PT to complete orders, pt in agreement.      Recommendations for follow up therapy are one component of a multi-disciplinary discharge planning process, led by the attending physician.  Recommendations may be updated based on patient status, additional functional criteria and insurance authorization.  Follow Up Recommendations No PT follow up      Assistance Recommended at Discharge PRN  Patient can return home with the following       Equipment Recommendations None recommended by PT  Recommendations for Other Services       Functional Status Assessment Patient has not had a recent decline in their functional status     Precautions / Restrictions Precautions Precautions: None      Mobility  Bed Mobility               General bed mobility comments: not observed, pt received & left sitting EOB    Transfers Overall transfer level: Independent Equipment used: None               General transfer comment: STS without AD    Ambulation/Gait Ambulation/Gait assistance: Modified independent (Device/Increase time) Gait Distance (Feet): 200 Feet Assistive device: None Gait Pattern/deviations: Wide base of support, Decreased  step length - left, Decreased step length - right, Decreased stride length Gait velocity: decreased        Stairs Stairs: Yes Stairs assistance: Modified independent (Device/Increase time) Stair Management: Two rails Number of Stairs: 4 General stair comments: step to pattern to ascend, reciprocal pattern to descend  Wheelchair Mobility    Modified Rankin (Stroke Patients Only)       Balance Overall balance assessment: Needs assistance, History of Falls Sitting-balance support: Feet supported Sitting balance-Leahy Scale: Good     Standing balance support: During functional activity, No upper extremity supported Standing balance-Leahy Scale: Good Standing balance comment: no LOB observed throughout session                             Pertinent Vitals/Pain Pain Assessment Pain Assessment: No/denies pain    Home Living Family/patient expects to be discharged to:: Private residence Living Arrangements:  (girlfriend) Available Help at Discharge: Family;Available PRN/intermittently Type of Home: House Home Access: Stairs to enter Entrance Stairs-Rails: Right;Can reach Technical sales engineer of Steps: 5   Home Layout: One level Home Equipment: Firefighter      Prior Function Prior Level of Function : Independent/Modified Independent             Mobility Comments: Does not drive, ambulatory without AD, reports he has had 2 falls in past 6 months + 2 instances of rolling OOB.       Hand Dominance  Extremity/Trunk Assessment   Upper Extremity Assessment Upper Extremity Assessment: Overall WFL for tasks assessed    Lower Extremity Assessment Lower Extremity Assessment:  (BLE ace wrapped, wound to toe,)       Communication   Communication: No difficulties  Cognition Arousal/Alertness: Awake/alert Behavior During Therapy: WFL for tasks assessed/performed Overall Cognitive Status: Within Functional Limits for tasks  assessed                                 General Comments: Polite man, follows instructions throughout session        General Comments General comments (skin integrity, edema, etc.): c/o SOB at end of session but SPO2 99% on room air    Exercises     Assessment/Plan    PT Assessment Patient does not need any further PT services  PT Problem List         PT Treatment Interventions      PT Goals (Current goals can be found in the Care Plan section)  Acute Rehab PT Goals Patient Stated Goal: go home PT Goal Formulation: With patient Time For Goal Achievement: 02/16/22 Potential to Achieve Goals: Good    Frequency       Co-evaluation               AM-PAC PT "6 Clicks" Mobility  Outcome Measure Help needed turning from your back to your side while in a flat bed without using bedrails?: None Help needed moving from lying on your back to sitting on the side of a flat bed without using bedrails?: None Help needed moving to and from a bed to a chair (including a wheelchair)?: None Help needed standing up from a chair using your arms (e.g., wheelchair or bedside chair)?: None Help needed to walk in hospital room?: None Help needed climbing 3-5 steps with a railing? : None 6 Click Score: 24    End of Session   Activity Tolerance: Patient tolerated treatment well Patient left: with call bell/phone within reach (sitting EOB) Nurse Communication: Mobility status      Time: 1914-7829 PT Time Calculation (min) (ACUTE ONLY): 12 min   Charges:   PT Evaluation $PT Eval Low Complexity: 1 Low          Aleda Grana, PT, DPT 02/02/22, 10:07 AM   Sandi Mariscal 02/02/2022, 10:06 AM

## 2022-02-03 DIAGNOSIS — L03116 Cellulitis of left lower limb: Secondary | ICD-10-CM | POA: Diagnosis not present

## 2022-02-03 DIAGNOSIS — I482 Chronic atrial fibrillation, unspecified: Secondary | ICD-10-CM | POA: Diagnosis not present

## 2022-02-03 LAB — CREATININE, SERUM
Creatinine, Ser: 1.31 mg/dL — ABNORMAL HIGH (ref 0.61–1.24)
GFR, Estimated: >60 mL/min (ref >60)

## 2022-02-03 LAB — GLUCOSE, CAPILLARY: Glucose-Capillary: 125 mg/dL — ABNORMAL HIGH (ref 70–99)

## 2022-02-03 MED ORDER — CEPHALEXIN 500 MG PO CAPS
500.0000 mg | ORAL_CAPSULE | Freq: Three times a day (TID) | ORAL | 0 refills | Status: AC
Start: 2022-02-03 — End: 2022-02-10

## 2022-02-03 NOTE — Discharge Summary (Signed)
Physician Discharge Summary   Patient: [name] MRN: [id number] DOB: 1956/01/11  Admit date:     01/31/2022  Discharge date: 02/03/22  Discharge Physician: Sharen Hones   PCP: [facility]   Recommendations at discharge:   Follow-up with PCP in 1 week.   Discharge Diagnoses: Principal Problem:   Cellulitis of left anterior lower leg Active Problems:   Morbid obesity ([place])   Hyperlipidemia   Left bundle branch block   CAD (coronary artery disease)   HTN (hypertension)   Stage 3a chronic kidney disease (CKD) ([name])   COPD (chronic obstructive pulmonary disease) ([name])   Tobacco abuse   Type 2 diabetes mellitus with diabetic neuropathy ([name])   Diabetic infection of left foot ([name])   Essential hypertension   Atrial fibrillation, chronic ([name])   Left leg cellulitis   Hypokalemia  Resolved Problems:   * No resolved hospital problems. [facility] Course: [name] is a 66 year old male with hypertension, non-insulin-dependent diabetes mellitus, hyperlipidemia, depression, neuropathy, who presents emergency department for chief concerns of left leg worsening wound. Patient is diagnosed with left leg cellulitis, started on antibiotics with cefepime and vancomycin. ABI did not show significant arterial occlusion. Patient was treated for 3 days, condition much improved.  At my examination today, patient the left leg has venous stasis ulcerations, no deep wound.  Redness much improved.  At this point, he will need additional 5 days of Keflex.  Patient be referred to wound care, I will also request home health RN for dressing changes.  Assessment and Plan:  Left leg cellulitis with chronic leg wound infection Chronic leg lymphedema. ABI did not show significant peripheral arterial disease.  Blood culture no growth. Treated with vancomycin and cefepime, condition much improved.  At this point, will be treated with 5 days of Keflex Referred to  outpatient wound care, also ask social work to set up home health. At this point, patient mobility has improved to baseline, PT/OT recommended no need to follow-up.  Chronic atrial fibrillation. No change in treatment, continue metoprolol and apixaban.   Type 2 diabetes. On sliding scale insulin.   Coronary disease. Stable.  Morbid obesity. Diet exercise.   Hypokalemia. Chronic kidney disease stage IIIa. Potassium normalized, renal function stable.         Consultants: None Procedures performed: None  Disposition: Home health Diet recommendation:  Discharge Diet Orders (From admission, onward)     Start     Ordered   02/03/22 0000  Diet - low sodium heart healthy        02/03/22 0927           Cardiac diet DISCHARGE MEDICATION: Allergies as of 02/03/2022       Reactions   Metformin    CKD        Medication List     STOP taking these medications    metoprolol tartrate 25 MG tablet Commonly known as: LOPRESSOR       TAKE these medications    albuterol 108 (90 Base) MCG/ACT inhaler Commonly known as: VENTOLIN HFA Inhale 2 puffs into the lungs every 4 (four) hours as needed for wheezing or shortness of breath.   apixaban 5 MG Tabs tablet Commonly known as: ELIQUIS Take 1 tablet (5 mg total) by mouth 2 (two) times daily.   cephALEXin 500 MG capsule Commonly known as: KEFLEX Take 1 capsule (500 mg total) by mouth 3 (three) times daily for 7 days.   citalopram 10  MG tablet Commonly known as: CELEXA Take 10 mg by mouth daily.   furosemide 20 MG tablet Commonly known as: LASIX Take 1 tablet (20 mg total) by mouth daily.   glipiZIDE 10 MG 24 hr tablet Commonly known as: GLUCOTROL XL Take 10 mg by mouth daily.   isosorbide mononitrate 30 MG 24 hr tablet Commonly known as: IMDUR Take 0.5 tablets (15 mg total) by mouth daily.   linagliptin 5 MG Tabs tablet Commonly known as: TRADJENTA Take 5 mg by mouth every morning.   lisinopril 5 MG  tablet Commonly known as: ZESTRIL Take 1 tablet (5 mg total) by mouth daily.   loratadine 10 MG tablet Commonly known as: CLARITIN Take 10 mg by mouth daily as needed for allergies (seasonal allergies in the spring).   lovastatin 40 MG tablet Commonly known as: MEVACOR Take 40 mg by mouth at bedtime.   metoprolol succinate 25 MG 24 hr tablet Commonly known as: Toprol XL Take 1 tablet (25 mg total) by mouth daily.   nitroGLYCERIN 0.4 MG SL tablet Commonly known as: NITROSTAT Place 1 tablet (0.4 mg total) under the tongue every 5 (five) minutes as needed for chest pain.   pregabalin 100 MG capsule Commonly known as: LYRICA Take 100 mg by mouth 3 (three) times daily.   sertraline 100 MG tablet Commonly known as: ZOLOFT Take 100 mg by mouth at bedtime.   traZODone 150 MG tablet Commonly known as: DESYREL TAKE 1 TABLET BY MOUTH AT BEDTIME.               Discharge Care Instructions  (From admission, onward)           Start     Ordered   02/03/22 0000  Discharge wound care:       Comments: Follow with [facility] RN, refer to wound care   02/03/22 0927            Follow-up Information     [facility] Follow up in 1 week(s).   Specialty: General Practice Contact information: [address] [place] [address] [phone]         Antonieta Iba, MD .   Specialty: Cardiology Contact information: [address] [place] [address] [place] [address] [phone]                Discharge Exam: [name] Weights   01/31/22 1238  Weight: (!) 148.8 kg   General exam: Appears calm and comfortable, morbid obese Respiratory system: Clear to auscultation. Respiratory effort normal. Cardiovascular system: Irregular no JVD, murmurs, rubs, gallops or clicks. No pedal edema. Gastrointestinal system: Abdomen is nondistended, soft and nontender. No organomegaly or masses felt. Normal bowel sounds heard. Central nervous system: Alert  and oriented. No focal neurological deficits. Extremities: Left leg venous stasis ulcers.  No deep wound.  Redness much improved Skin: No rashes, lesions or ulcers Psychiatry: Judgement and insight appear normal. Mood & affect appropriate.    Condition at discharge: good  The results of significant diagnostics from this hospitalization (including imaging, microbiology, ancillary and laboratory) are listed below for reference.   Imaging Studies: LONG TERM MONITOR (3-14 DAYS)  Result Date: 02/02/2022 Event monitor Patch Wear Time:  6 days and 3 hours (2023-06-15T10:02:06-399 to 2023-06-21T13:21:39-0400) Atrial Flutter occurred continuously (100% burden), ranging from 38-82 bpm (avg of 58 bpm). Bundle Branch Block/IVCD was present. Isolated VEs were occasional (1.4%, 6984), VE Couplets were rare (<1.0%, 474), and VE Triplets were rare (<1.0%, 40).  No patient triggered events recorded Signed, Dossie Arbour, MD, Ph.D [facility]   US ARTERIAL ABI (SCREENING LOWER EXTREMITY)  Result Date: 02/01/2022 CLINICAL DATA:  Nonhealing left leg wound, hypertension, hyperlipidemia, diabetes, history of tobacco abuse EXAM: NONINVASIVE PHYSIOLOGIC VASCULAR STUDY OF BILATERAL LOWER EXTREMITIES TECHNIQUE: Evaluation of both lower extremities were performed at rest, including calculation of ankle-brachial indices with single level Doppler, pressure and pulse volume recording. COMPARISON:  None Available. FINDINGS: Right ABI:  1.2 Left ABI:  1.2 Right Lower Extremity:  Normal arterial waveforms at the ankle. Left Lower Extremity:  Normal arterial waveforms at the ankle. IMPRESSION: No evidence of hemodynamically significant lower extremity arterial occlusive disease at rest. Electronically Signed   By: Corlis Leak M.D.   On: 02/01/2022 12:07   DG Foot Complete Left  Result Date: 01/31/2022 CLINICAL DATA:  Soft tissue swelling EXAM: LEFT FOOT - COMPLETE 3+ VIEW COMPARISON:  None Available. FINDINGS: No recent fracture  or dislocation is seen. There are no focal lytic lesions. Plantar spur is seen in the calcaneus. Minimal bony spurs seen in first metatarsophalangeal joint. Bony spurs seen in the dorsal aspect of talonavicular joint. Small plantar spur is seen in the calcaneus. IMPRESSION: No fracture or dislocation is seen in the left foot. There are no focal lytic lesions. Other findings as described in the body of the report. Electronically Signed   By: Ernie Avena M.D.   On: 01/31/2022 16:56   DG Tibia/Fibula Left  Result Date: 01/31/2022 CLINICAL DATA:  Left lower leg edema for 1 week. EXAM: LEFT TIBIA AND FIBULA - 2 VIEW COMPARISON:  None Available. FINDINGS: Status post plate and screw fixation of the lateral tibial plateau with intact hardware. No fracture or dislocation. No cortical erosion or periosteal reaction. Subcutaneous soft tissue edema and skin thickening suggesting cellulitis. IMPRESSION: 1.  No acute osseous abnormality. 2. Skin thickening and subcutaneous soft tissue edema suggesting cellulitis. Electronically Signed   By: Larose Hires D.O.   On: 01/31/2022 16:55    Microbiology: Results for orders placed or performed during the hospital encounter of 01/31/22  Culture, blood (Routine x 2)     Status: None (Preliminary result)   Collection Time: 01/31/22 12:46 PM   Specimen: BLOOD  Result Value Ref Range Status   Specimen Description BLOOD LEFT ANTECUBITAL  Final   Special Requests   Final    BOTTLES DRAWN AEROBIC AND ANAEROBIC Blood Culture adequate volume   Culture   Final    NO GROWTH 3 DAYS Performed at [facility], [address], [place], [place] [address]    Report Status PENDING  Incomplete  Culture, blood (Routine x 2)     Status: None (Preliminary result)   Collection Time: 01/31/22  5:01 PM   Specimen: BLOOD  Result Value Ref Range Status   Specimen Description BLOOD LAC  Final   Special Requests BOTTLES DRAWN AEROBIC AND ANAEROBIC BCAV  Final   Culture    Final    NO GROWTH 3 DAYS Performed at [facility], [address] [place], [place] [address]    Report Status PENDING  Incomplete  MRSA Next Gen by PCR, Nasal     Status: None   Collection Time: 01/31/22 11:41 PM   Specimen: Nasal Mucosa; Nasal Swab  Result Value Ref Range Status   MRSA by PCR Next Gen NOT DETECTED NOT DETECTED Final    Comment: (NOTE) The GeneXpert MRSA Assay (FDA approved for NASAL specimens only), is one component  of a comprehensive MRSA colonization surveillance program. It is not intended to diagnose MRSA infection nor to guide or monitor treatment for MRSA infections. Test performance is not FDA approved in patients less than 75 years old. Performed at [facility], [address] [place], [place] [address]     Labs: CBC: Recent Labs  Lab 01/31/22 1246 02/01/22 0434  WBC 6.3 5.7  NEUTROABS 4.4  --   HGB 14.7 14.0  HCT 46.0 42.9  MCV 85.7 84.8  PLT 108* 91*   Basic Metabolic Panel: Recent Labs  Lab 01/31/22 1246 02/01/22 0434 02/02/22 0546 02/03/22 0550  NA 135 139 135  --   K 3.4* 3.4* 3.9  --   CL 102 106 105  --   CO2 28 27 25   --   GLUCOSE 135* 112* 137*  --   BUN 10 9 14   --   CREATININE 1.25* 1.19 1.25* 1.31*  CALCIUM 8.6* 8.4* 8.8*  --   MG  --  1.9  --   --    Liver Function Tests: Recent Labs  Lab 01/31/22 1246  AST 23  ALT 13  ALKPHOS 110  BILITOT 1.4*  PROT 7.7  ALBUMIN 3.0*   CBG: Recent Labs  Lab 02/02/22 0805 02/02/22 1232 02/02/22 1613 02/02/22 2027 02/03/22 0801  GLUCAP 111* 107* 105* 124* 125*    Discharge time spent: greater than 30 minutes.  Signed: Sharen Hones, MD [facility] 02/03/2022

## 2022-02-03 NOTE — TOC Transition Note (Signed)
Transition of Care [facility]) - CM/SW Discharge Note   Patient Details  Name: [name] MRN: [id number] Date of Birth: September 14, 1955  Transition of Care [facility] CM/SW Contact:  Bing Quarry, RN Phone Number: 02/03/2022, 10:14 AM   Clinical Narrative: 7/2: Patient will be discharging to home with [facility] RN for wound care follow up. Patient will also be followed by outpatient wound care per provider notes. Frances Furbish via Kandee Keen accepted for [facility] RN. Spoke with patient and he has RW/WC at home and had no preference for [facility] agency. Lives with girlfriend and has no issues getting medication, transportation to grocery or appointments. PCP and RX confirmed as in chart. Gabriel Cirri RN CM       Final next level of care: Home w Home Health Services Barriers to Discharge: Barriers Resolved   Patient Goals and CMS Choice     Choice offered to / list presented to : Patient  Discharge Placement                       Discharge Plan and Services   Discharge Planning Services: CM Consult            DME Arranged: N/A DME Agency: NA       HH Arranged: RN HH Agency: [facility] Date [facility] Agency Contacted: 02/03/22 Time HH Agency Contacted: 1014 Representative spoke with at [facility] Agency: Kandee Keen  Social Determinants of Health (SDOH) Interventions     Readmission Risk Interventions     No data to display

## 2022-02-03 NOTE — Progress Notes (Signed)
Pt d/c home by family. Ivs removed intact. VSS. Education completed. Toe dressing and L Unna Boot changed. All belongings sent with pt. No questions from pt about d/c.

## 2022-02-05 LAB — CULTURE, BLOOD (ROUTINE X 2)
Culture: NO GROWTH
Culture: NO GROWTH
Special Requests: ADEQUATE

## 2022-02-07 ENCOUNTER — Telehealth: Payer: Self-pay | Admitting: Emergency Medicine

## 2022-02-07 NOTE — Telephone Encounter (Signed)
Attempted to call patient to go over results. Phone was answered by unknown person, then seems to have been set down, swearing heard in background.   Will attempt to call again at later time.

## 2022-02-07 NOTE — Telephone Encounter (Signed)
-----   Message from Antonieta Iba, MD sent at 02/02/2022  7:56 PM EDT ----- Event monitor Shows atrial flutter 100% of the time Would discuss with EP when you see them; early august Options include medical management and stay in flutter vs start antiarrhythmic and repeat cardioversion. Prior cardioversion was difficult to determine rhythm after therapy was delivered

## 2022-02-08 NOTE — Telephone Encounter (Signed)
Attempted to call patient. No answer, unable to leave VM °

## 2022-02-12 NOTE — Telephone Encounter (Signed)
Attempted to call patient. No answer, unable to leave voicemail.

## 2022-02-15 ENCOUNTER — Encounter: Payer: Medicare HMO | Attending: Physician Assistant | Admitting: Physician Assistant

## 2022-02-15 ENCOUNTER — Encounter: Payer: Self-pay | Admitting: Emergency Medicine

## 2022-02-15 DIAGNOSIS — L97822 Non-pressure chronic ulcer of other part of left lower leg with fat layer exposed: Secondary | ICD-10-CM | POA: Diagnosis not present

## 2022-02-15 DIAGNOSIS — I130 Hypertensive heart and chronic kidney disease with heart failure and stage 1 through stage 4 chronic kidney disease, or unspecified chronic kidney disease: Secondary | ICD-10-CM | POA: Diagnosis not present

## 2022-02-15 DIAGNOSIS — J449 Chronic obstructive pulmonary disease, unspecified: Secondary | ICD-10-CM | POA: Insufficient documentation

## 2022-02-15 DIAGNOSIS — L97522 Non-pressure chronic ulcer of other part of left foot with fat layer exposed: Secondary | ICD-10-CM | POA: Insufficient documentation

## 2022-02-15 DIAGNOSIS — Z7901 Long term (current) use of anticoagulants: Secondary | ICD-10-CM | POA: Insufficient documentation

## 2022-02-15 DIAGNOSIS — E11621 Type 2 diabetes mellitus with foot ulcer: Secondary | ICD-10-CM | POA: Diagnosis present

## 2022-02-15 DIAGNOSIS — I480 Paroxysmal atrial fibrillation: Secondary | ICD-10-CM | POA: Insufficient documentation

## 2022-02-15 DIAGNOSIS — I5042 Chronic combined systolic (congestive) and diastolic (congestive) heart failure: Secondary | ICD-10-CM | POA: Insufficient documentation

## 2022-02-15 DIAGNOSIS — N183 Chronic kidney disease, stage 3 unspecified: Secondary | ICD-10-CM | POA: Insufficient documentation

## 2022-02-15 DIAGNOSIS — L84 Corns and callosities: Secondary | ICD-10-CM | POA: Insufficient documentation

## 2022-02-15 DIAGNOSIS — F17218 Nicotine dependence, cigarettes, with other nicotine-induced disorders: Secondary | ICD-10-CM | POA: Insufficient documentation

## 2022-02-15 DIAGNOSIS — L97812 Non-pressure chronic ulcer of other part of right lower leg with fat layer exposed: Secondary | ICD-10-CM | POA: Insufficient documentation

## 2022-02-15 DIAGNOSIS — I2510 Atherosclerotic heart disease of native coronary artery without angina pectoris: Secondary | ICD-10-CM | POA: Diagnosis not present

## 2022-02-15 DIAGNOSIS — E1122 Type 2 diabetes mellitus with diabetic chronic kidney disease: Secondary | ICD-10-CM | POA: Diagnosis not present

## 2022-02-15 NOTE — Telephone Encounter (Signed)
Unable to get in touch with patient via phone, results letter printed for patient 

## 2022-02-15 NOTE — Progress Notes (Signed)
[name], CORTESE ([id number]) Visit Report for 02/15/2022 Abuse Risk Screen Details Patient Name: [name], [name]. Date of Service: 02/15/2022 12:45 PM Medical Record Number: [id number] Patient Account Number: [id number] Date of Birth/Sex: [birth year] (66 y.o. Male) Treating [name]: [name] Primary Care [name]: [name] Other Clinician: Referring Elliemae Braman: [name] Treating Jaline Pincock/Extender: [name] in Treatment: 0 Abuse Risk Screen Items Answer ABUSE RISK SCREEN: Has anyone close to you tried to hurt or harm you recentlyo No Do you feel uncomfortable with anyone in your familyo No Has anyone forced you do things that you didnot want to doo No Electronic Signature(s) Signed: 02/15/2022 5:00:29 PM By: [name], BSN, [name], [name], [name], [name] Entered By: [name], BSN, [name], [name], Jeffrey on 02/15/2022 14:09:47 [name] -------------------------------------------------------------------------------- Activities of Daily Living Details Patient Name: [name], [name] Date of Service: 02/15/2022 12:45 PM Medical Record Number: [id number] Patient Account Number: [id number] Date of Birth/Sex: 1955-09-30 (66 y.o. Male) Treating [name]: [name] Primary Care Aideliz Garmany: [name] Other Clinician: Referring Dwaine Pringle: [name] Treating Cailey Trigueros/Extender: [name] in Treatment: 0 Activities of Daily Living Items Answer Activities of Daily Living (Please select one for each item) Drive Automobile Not Able Take Medications Completely Able Use Telephone Completely Able Care for Appearance Completely Able Use Toilet Completely Able Bath / Shower Completely Able Dress Self Completely Able Feed Self Completely Able Walk Completely Able Get In / Out Bed Completely Able Housework Completely Able Prepare Meals Completely Able Handle Money Completely Able Shop for Self Completely Able Electronic Signature(s) Signed: 02/15/2022 2:28:12 PM By: [name] Signed: 02/15/2022 5:00:29 PM By: [name], BSN, [name], [name], [name], [name] Entered By: [name] on 02/15/2022 14:28:12 [name] ([id number]) -------------------------------------------------------------------------------- Education Screening Details Patient Name: [name], [name] L. Date of Service: 02/15/2022 12:45 PM Medical Record Number: [id number] Patient Account Number: [id number] Date of Birth/Sex: 1956/01/23 (66 y.o. Male) Treating [name]: [name] Primary Care Thirza Pellicano: [name] Other Clinician: Referring Reginal Wojcicki: [name] Treating Nethra Mehlberg/Extender: [name] in Treatment: 0 Primary Learner Assessed: Patient Learning Preferences/Education Level/Primary Language Learning Preference: Explanation, Demonstration Highest Education Level: High School Preferred Language: English Cognitive Barrier Language Barrier: No Translator Needed: No Memory Deficit: No Emotional Barrier: No Physical Barrier Impaired Vision: No Impaired Hearing: No Decreased Hand dexterity: No Knowledge/Comprehension Knowledge Level: High Comprehension Level: High Ability to understand written instructions: High Ability to understand verbal instructions: High Motivation Anxiety Level: Calm Cooperation: Cooperative Education Importance: Acknowledges Need Interest in Health Problems: Asks Questions Perception: Coherent Willingness to Engage in Self-Management High Activities: Readiness to Engage in Self-Management High Activities: Electronic Signature(s) Signed: 02/15/2022 2:28:24 PM By: [name] Signed: 02/15/2022 5:00:29 PM By: [name], BSN, [name], [name], [name], [name] Entered By: [name] on 02/15/2022 14:28:23 [name], [name] ([id number]) -------------------------------------------------------------------------------- Fall Risk Assessment Details Patient Name: [name], [name] Date of Service: 02/15/2022 12:45 PM Medical Record Number: [id number] Patient  Account Number: [id number] Date of Birth/Sex: 1956/04/26 (66 y.o. Male) Treating [name]: [name] Primary Care Lindsey Hommel: [name] Other Clinician: Referring Antwaun Buth: [name] Treating Winola Drum/Extender: [name] in Treatment: 0 Fall Risk Assessment Items Have you had 2 or more falls in the last 12 monthso 0 Yes Have you had any fall that resulted in injury in the last 12 monthso 0 No FALLS RISK SCREEN History of falling - immediate or within 3 months 0 No Secondary diagnosis (Do you have 2 or more medical diagnoseso) 0 No Ambulatory aid None/bed rest/wheelchair/nurse 0 Yes  Crutches/cane/walker 0 No Furniture 0 No Intravenous therapy Access/Saline/Heparin Lock 0 No Gait/Transferring Normal/ bed rest/ wheelchair 0 Yes Weak (short steps with or without shuffle, stooped but able to lift head while walking, may 0 No seek support from furniture) Impaired (short steps with shuffle, may have difficulty arising from chair, head down, impaired 0 No balance) Mental Status Oriented to own ability 0 Yes Electronic Signature(s) Signed: 02/15/2022 2:29:19 PM By: [name] Signed: 02/15/2022 5:00:29 PM By: [name], BSN, [name], [name], [name], [name] Entered By: [name] on 02/15/2022 14:29:19 [name] -------------------------------------------------------------------------------- Foot Assessment Details Patient Name: [name], [name] Date of Service: 02/15/2022 12:45 PM Medical Record Number: [id number] Patient Account Number: [id number] Date of Birth/Sex: 1956/03/17 (66 y.o. Male) Treating [name]: [name] Primary Care Dangela How: [name] Other Clinician: Referring Gaby Harney: [name] Treating Lucillia Corson/Extender: [name] in Treatment: 0 Foot Assessment Items Site Locations + = Sensation present, - = Sensation absent, C = Callus, U = Ulcer R = Redness, W = Warmth, M = Maceration, PU = Pre-ulcerative lesion F = Fissure, S = Swelling, D =  Dryness Assessment Right: Left: Other Deformity: No No Prior Foot Ulcer: No No Prior Amputation: No No Charcot Joint: No No Ambulatory Status: Ambulatory Without Help Gait: Steady Electronic Signature(s) Signed: 02/15/2022 2:29:36 PM By: [name] Signed: 02/15/2022 5:00:29 PM By: [name], BSN, [name], [name], [name], [name] Entered By: [name] on 02/15/2022 14:29:36 Wherley, [name] ([id number]) -------------------------------------------------------------------------------- Nutrition Risk Screening Details Patient Name: Fan, [name] Date of Service: 02/15/2022 12:45 PM Medical Record Number: [id number] Patient Account Number: [id number] Date of Birth/Sex: 1956/06/20 (66 y.o. Male) Treating [name]: [name] Primary Care Rachel Samples: [name] Other Clinician: Referring Hooper Petteway: [name] Treating Carlyann Placide/Extender: [name] in Treatment: 0 Height (in): 72 Weight (lbs): 330 Body Mass Index (BMI): 44.8 Nutrition Risk Screening Items Score Screening NUTRITION RISK SCREEN: I have an illness or condition that made me change the kind and/or amount of food I eat 0 No I eat fewer than two meals per day 0 No I eat few fruits and vegetables, or milk products 0 No I have three or more drinks of beer, liquor or wine almost every day 0 No I have tooth or mouth problems that make it hard for me to eat 0 No I don't always have enough money to buy the food I need 0 No I eat alone most of the time 0 No I take three or more different prescribed or over-the-counter drugs a day 1 Yes Without wanting to, I have lost or gained 10 pounds in the last six months 0 No I am not always physically able to shop, cook and/or feed myself 0 No Nutrition Protocols Good Risk Protocol 0 No interventions needed Moderate Risk Protocol High Risk Proctocol Risk Level: Good Risk Score: 1 Electronic Signature(s) Signed: 02/15/2022 2:29:28 PM By: [name] Signed: 02/15/2022 5:00:29 PM By:  [name], BSN, [name], [name], [name], [name] Entered By: [name] on 02/15/2022 14:29:28

## 2022-02-15 NOTE — Progress Notes (Signed)
Unable to get in touch with patient via phone, results letter printed for patient

## 2022-02-19 ENCOUNTER — Other Ambulatory Visit: Payer: Self-pay | Admitting: Physician Assistant

## 2022-02-19 ENCOUNTER — Other Ambulatory Visit (HOSPITAL_COMMUNITY): Payer: Self-pay | Admitting: Physician Assistant

## 2022-02-19 DIAGNOSIS — L97822 Non-pressure chronic ulcer of other part of left lower leg with fat layer exposed: Secondary | ICD-10-CM

## 2022-02-27 ENCOUNTER — Ambulatory Visit
Admission: RE | Admit: 2022-02-27 | Discharge: 2022-02-27 | Disposition: A | Discharge status: Home / Self Care | Payer: Medicare HMO | Source: Ambulatory Visit | Attending: Physician Assistant | Admitting: Physician Assistant

## 2022-02-27 DIAGNOSIS — L97822 Non-pressure chronic ulcer of other part of left lower leg with fat layer exposed: Secondary | ICD-10-CM | POA: Diagnosis present

## 2022-02-27 MED ORDER — GADOBUTROL 1 MMOL/ML IV SOLN
10.0000 mL | Freq: Once | INTRAVENOUS | Status: AC | PRN
  Administered 2022-02-27: 10 mL via INTRAVENOUS

## 2022-03-04 ENCOUNTER — Encounter: Payer: Medicare HMO | Admitting: Physician Assistant

## 2022-03-04 DIAGNOSIS — E11621 Type 2 diabetes mellitus with foot ulcer: Secondary | ICD-10-CM | POA: Diagnosis not present

## 2022-03-04 NOTE — Progress Notes (Addendum)
KYAIR, VOLOSHIN ([id number]) Visit Report for 03/04/2022 Chief Complaint Document Details Patient Name: [name], [name]. Date of Service: 03/04/2022 2:15 PM Medical Record Number: [id number] Patient Account Number: [id number] Date of Birth/Sex: 04-Aug-1956 (66 y.o. M) Treating RN: Primary Care Provider: Delight Stare Other Clinician: Referring Provider: Delight Stare Treating Provider/Extender: Skipper Cliche in Treatment: 2 Information Obtained from: Patient Chief Complaint Left LE and foot ulcers Electronic Signature(s) Signed: 03/04/2022 2:45:59 PM By: Worthy Keeler PA-C Entered By: Worthy Keeler on 03/04/2022 14:45:59 Appling, Rayvon Char ([id number]) -------------------------------------------------------------------------------- HPI Details Patient Name: [name]. Date of Service: 03/04/2022 2:15 PM Medical Record Number: [id number] Patient Account Number: [id number] Date of Birth/Sex: May 08, 1956 (66 y.o. M) Treating RN: Primary Care Provider: Delight Stare Other Clinician: Referring Provider: Delight Stare Treating Provider/Extender: Skipper Cliche in Treatment: 2 History of Present Illness HPI Description: 02-15-2022 patient presents for initial evaluation here in the clinic concerning issues that he is having currently with wounds of lower extremity which has been present for about the past month in general this is on the leg as well as the toe. Neither 1 of these actually appear to be too significant which is good news. Fortunately I do not see any evidence of active infection locally or systemically which is also great news. He was in the hospital 2 weeks ago for infection and was treated and doing much better at this point. With that being said he also may have some possible issues here with alcohol and marijuana usage although I do not know for sure there is just some illusion to this between him and his brother-in-law Zetuvit talking in the room today. With  that being said I do think that based on what I am seeing currently the patient does have some fairly concerning issues with the fact that he is a diabetic though he is very well controlled this is good news is hemoglobin A1c is 5.9. With that being said there is some evidence of a more significant wound at around the upper tibia/knee area on the left leg and this is at the site of a surgical location where he actually has some tracking down to hardware I am wondering if this is a draining sinus from possibly hardware infection situation. The patient states that this area has been open for about 6 months and may be more and it is intermittently back-and-forth that he will be put in a little bit of antibiotic ointment on it it will seem to close then reopen again. Again this sounds like something that is been more chronic to be honest. The patient does have a history of diabetes mellitus type 2, hypertension, congestive heart failure, COPD, atrial fibrillation, long-term use of anticoagulants due to this, coronary artery disease, and chronic kidney disease stage III per patient's disclosures and chart review. I did also review the patient's ABIs which showed a right ABI of 1.2 with a left ABI of 1.2 as well but normal arterial waveforms at the ankle bilaterally there was no evidence of arterial occlusive disease and this was performed on 02-01-2022. I also reviewed the patient's left tibia and fibula x-ray as well which shows status post plate and screw fixation of the lateral tibial plateau with intact hardware no fracture dislocation no cortical erosion or periosteal reaction was obvious. The patient did have subcutaneous tissue edema and skin thickening suggestive of cellulitis. That was on 01-31-2022. Lastly I did also review the foot x-ray which showed no fracture or dislocation  in the left foot there was no focal lytic lesions. That was performed on 01-31-2022. 03-04-2022 upon evaluation today  patient appears to be doing a little better in regard to his left leg in general unfortunately the right leg has opened at this point. Fortunately I do not see any evidence of active infection locally or systemically which is great news other than at the left knee where there is definitely warm to touch here. I am concerned because this still does probe down to bone/hardware location but again there was no definitive signs of hardware infection or osteomyelitis noted on MRI just cellulitis was noted this is at least good news primarily. Electronic Signature(s) Signed: 03/04/2022 3:21:29 PM By: Worthy Keeler PA-C Entered By: Worthy Keeler on 03/04/2022 15:21:29 [name] ([id number]) -------------------------------------------------------------------------------- Physical Exam Details Patient Name: Jeffrey Pop L. Date of Service: 03/04/2022 2:15 PM Medical Record Number: [id number] Patient Account Number: [id number] Date of Birth/Sex: Sep 16, 1955 (66 y.o. M) Treating RN: Cornell Barman Primary Care Provider: Delight Stare Other Clinician: Referring Provider: Delight Stare Treating Provider/Extender: Skipper Cliche in Treatment: 2 Constitutional Well-nourished and well-hydrated in no acute distress. Respiratory normal breathing without difficulty. Psychiatric this patient is able to make decisions and demonstrates good insight into disease process. Alert and Oriented x 3. pleasant and cooperative. Notes Upon inspection patient's wounds again on the left leg seem to be doing much better the right leg these are new and seem to just be more blisters but nonetheless Wilkening wrap this leg as well as definitely larger than the left. Overall I do think that the knee is somewhat better although at this point they are not really can be a way to pack anything down into the wound bed unfortunately. This was discussed with the patient today. Electronic Signature(s) Signed: 03/04/2022  3:21:57 PM By: Worthy Keeler PA-C Entered By: Worthy Keeler on 03/04/2022 15:21:57 [name] ([id number]) -------------------------------------------------------------------------------- Physician Orders Details Patient Name: [name] Date of Service: 03/04/2022 2:15 PM Medical Record Number: [id number] Patient Account Number: [id number] Date of Birth/Sex: 12/27/1955 (66 y.o. M) Treating RN: Cornell Barman Primary Care Provider: Delight Stare Other Clinician: Referring Provider: Delight Stare Treating Provider/Extender: Skipper Cliche in Treatment: 2 Verbal / Phone Orders: No Diagnosis Coding ICD-10 Coding Code Description E11.621 Type 2 diabetes mellitus with foot ulcer L97.522 Non-pressure chronic ulcer of other part of left foot with fat layer exposed L97.822 Non-pressure chronic ulcer of other part of left lower leg with fat layer exposed L97.812 Non-pressure chronic ulcer of other part of right lower leg with fat layer exposed I10 Essential (primary) hypertension I50.42 Chronic combined systolic (congestive) and diastolic (congestive) heart failure J44.9 Chronic obstructive pulmonary disease, unspecified I48.0 Paroxysmal atrial fibrillation I25.10 Atherosclerotic heart disease of native coronary artery without angina pectoris Z79.01 Long term (current) use of anticoagulants N18.30 Chronic kidney disease, stage 3 unspecified L84 Corns and callosities F17.218 Nicotine dependence, cigarettes, with other nicotine-induced disorders Follow-up Appointments o Return Appointment in 2 weeks. [place] for wound care. May utilize formulary equivalent dressing for wound treatment orders unless otherwise specified. Home Health Nurse may visit PRN to address patientos wound care needs. o Scheduled days for dressing changes to be completed; exception, patient has scheduled wound care visit that day. o  **Please direct any NON-WOUND related issues/requests for orders to patient's Primary Care Physician. **If current dressing causes regression in wound condition, may D/C ordered dressing  product/s and apply Normal Saline Moist Dressing daily until next Wound Healing Center or Other MD appointment. **Notify Wound Healing Center of regression in wound condition at [phone]. Bathing/ Shower/ Hygiene o May shower with wound dressing protected with water repellent cover or cast protector. Wound Treatment Wound #2 - Knee Wound Laterality: Left Cleanser: Soap and Water (Home Health) 3 x Per Week/30 Days Discharge Instructions: Gently cleanse leg and foot with antibacterial soap, rinse and pat dry prior to dressing wounds Cleanser: Wound Cleanser (Home Health) 3 x Per Week/30 Days Discharge Instructions: Wash your hands with soap and water. Remove old dressing, discard into plastic bag and place into trash. Cleanse the wound with Wound Cleanser prior to applying a clean dressing using gauze sponges, not tissues or cotton balls. Do not scrub or use excessive force. Pat dry using gauze sponges, not tissue or cotton balls. Primary Dressing: Silvercel Small 2x2 (in/in) (Home Health) 3 x Per Week/30 Days Discharge Instructions: Apply Silvercel Small 2x2 (in/in) as instructed Secondary Dressing: (SILICONE BORDER) Zetuvit Plus SILICONE BORDER Dressing 4x4 (in/in) (Home Health) 3 x Per Week/30 Days Discharge Instructions: Please do not put silicone bordered dressings under wraps. Use non-bordered dressing only. Secured With: Tubigrip Size D, 3x10 (in/yd) 3 x Per Week/30 Days Discharge Instructions: over knee for swelling Compression Wrap: 3-LAYER WRAP - Profore Lite LF 3 Multilayer Compression Bandaging System (Home Health) 3 x Per Week/30 Days Bucker, Brayan L. ([id number]) Discharge Instructions: Apply 3 multi-layer wrap as prescribed. Wound #5 - Lower Leg Wound Laterality: Right, Posterior Cleanser:  Soap and Water (Home Health) 3 x Per Week/30 Days Discharge Instructions: Gently cleanse leg and foot with antibacterial soap, rinse and pat dry prior to dressing wounds Cleanser: Wound Cleanser (Home Health) 3 x Per Week/30 Days Discharge Instructions: Wash your hands with soap and water. Remove old dressing, discard into plastic bag and place into trash. Cleanse the wound with Wound Cleanser prior to applying a clean dressing using gauze sponges, not tissues or cotton balls. Do not scrub or use excessive force. Pat dry using gauze sponges, not tissue or cotton balls. Primary Dressing: Silvercel Small 2x2 (in/in) (Home Health) 3 x Per Week/30 Days Discharge Instructions: Apply Silvercel Small 2x2 (in/in) as instructed Secondary Dressing: (NON-BORDER) Zetuvit Plus Silicone NON-BORDER 5x5 (in/in) (Home Health) 3 x Per Week/30 Days Discharge Instructions: Please do not put silicone bordered dressings under wraps. Use non-bordered dressing only under wraps. Compression Wrap: 3-LAYER WRAP - Profore Lite LF 3 Multilayer Compression Bandaging System (Home Health) 3 x Per Week/30 Days Discharge Instructions: Apply 3 multi-layer wrap as prescribed. Patient Medications Allergies: No Known Drug Allergies Notifications Medication Indication Start End Bactrim DS 03/04/2022 DOSE 1 - oral 800 mg-160 mg tablet - 1 tablet oral taken 2 times per day for 14 days Electronic Signature(s) Signed: 03/05/2022 2:04:25 PM By: Lenda Kelp PA-C Signed: 03/08/2022 3:39:08 PM By: Elliot Gurney BSN, RN, CWS, Kim RN, BSN Previous Signature: 03/04/2022 4:13:33 PM Version By: Lenda Kelp PA-C Entered By: Elliot Gurney, BSN, RN, CWS, Kim on 03/05/2022 11:44:55 [place], Ivan Anchors ([id number]) -------------------------------------------------------------------------------- Problem List Details Patient Name: [name], [name] Date of Service: 03/04/2022 2:15 PM Medical Record Number: [id number] Patient Account Number: [id number] Date of  Birth/Sex: [birth year] (66 y.o. M) Treating RN: Primary Care Provider: Darreld Mclean Other Clinician: Referring Provider: Darreld Mclean Treating Provider/Extender: Rowan Blase in Treatment: 2 Active Problems ICD-10 Encounter Code Description Active Date MDM Diagnosis E11.621 Type 2 diabetes mellitus with foot ulcer 02/15/2022 No Yes  [id number] Non-pressure chronic ulcer of other part of left foot with fat layer 02/15/2022 No Yes exposed L97.822 Non-pressure chronic ulcer of other part of left lower leg with fat layer 02/15/2022 No Yes exposed L97.812 Non-pressure chronic ulcer of other part of right lower leg with fat layer 03/04/2022 No Yes exposed I10 Essential (primary) hypertension 02/15/2022 No Yes I50.42 Chronic combined systolic (congestive) and diastolic (congestive) heart 02/15/2022 No Yes failure J44.9 Chronic obstructive pulmonary disease, unspecified 02/15/2022 No Yes I48.0 Paroxysmal atrial fibrillation 02/15/2022 No Yes I25.10 Atherosclerotic heart disease of native coronary artery without angina 02/15/2022 No Yes pectoris Z79.01 Long term (current) use of anticoagulants 02/15/2022 No Yes N18.30 Chronic kidney disease, stage 3 unspecified 02/15/2022 No Yes L84 Corns and callosities 02/15/2022 No Yes [place], Stirling L. ([id number]) F17.218 Nicotine dependence, cigarettes, with other nicotine-induced disorders 03/04/2022 No Yes Inactive Problems Resolved Problems Electronic Signature(s) Signed: 03/04/2022 3:21:26 PM By: Worthy Keeler PA-C Previous Signature: 03/04/2022 2:45:55 PM Version By: Worthy Keeler PA-C Entered By: Worthy Keeler on 03/04/2022 15:21:26 Sorce, Gurjot L. ([id number]) -------------------------------------------------------------------------------- Progress Note Details Patient Name: [name], [name] Date of Service: 03/04/2022 2:15 PM Medical Record Number: [id number] Patient Account Number: [id number] Date of Birth/Sex: 07/18/1956 (66 y.o. M) Treating  RN: Cornell Barman Primary Care Provider: Delight Stare Other Clinician: Referring Provider: Delight Stare Treating Provider/Extender: Skipper Cliche in Treatment: 2 Subjective Chief Complaint Information obtained from Patient Left LE and foot ulcers History of Present Illness (HPI) 02-15-2022 patient presents for initial evaluation here in the clinic concerning issues that he is having currently with wounds of lower extremity which has been present for about the past month in general this is on the leg as well as the toe. Neither 1 of these actually appear to be too significant which is good news. Fortunately I do not see any evidence of active infection locally or systemically which is also great news. He was in the hospital 2 weeks ago for infection and was treated and doing much better at this point. With that being said he also may have some possible issues here with alcohol and marijuana usage although I do not know for sure there is just some illusion to this between him and his brother-in- law Zetuvit talking in the room today. With that being said I do think that based on what I am seeing currently the patient does have some fairly concerning issues with the fact that he is a diabetic though he is very well controlled this is good news is hemoglobin A1c is 5.9. With that being said there is some evidence of a more significant wound at around the upper tibia/knee area on the left leg and this is at the site of a surgical location where he actually has some tracking down to hardware I am wondering if this is a draining sinus from possibly hardware infection situation. The patient states that this area has been open for about 6 months and may be more and it is intermittently back-and-forth that he will be put in a little bit of antibiotic ointment on it it will seem to close then reopen again. Again this sounds like something that is been more chronic to be honest. The patient does have a  history of diabetes mellitus type 2, hypertension, congestive heart failure, COPD, atrial fibrillation, long-term use of anticoagulants due to this, coronary artery disease, and chronic kidney disease stage III per patient's disclosures and chart review. I did also review the patient's ABIs which  showed a right ABI of 1.2 with a left ABI of 1.2 as well but normal arterial waveforms at the ankle bilaterally there was no evidence of arterial occlusive disease and this was performed on 02-01-2022. I also reviewed the patient's left tibia and fibula x-ray as well which shows status post plate and screw fixation of the lateral tibial plateau with intact hardware no fracture dislocation no cortical erosion or periosteal reaction was obvious. The patient did have subcutaneous tissue edema and skin thickening suggestive of cellulitis. That was on 01-31-2022. Lastly I did also review the foot x-ray which showed no fracture or dislocation in the left foot there was no focal lytic lesions. That was performed on 01-31-2022. 03-04-2022 upon evaluation today patient appears to be doing a little better in regard to his left leg in general unfortunately the right leg has opened at this point. Fortunately I do not see any evidence of active infection locally or systemically which is great news other than at the left knee where there is definitely warm to touch here. I am concerned because this still does probe down to bone/hardware location but again there was no definitive signs of hardware infection or osteomyelitis noted on MRI just cellulitis was noted this is at least good news primarily. Objective Constitutional Well-nourished and well-hydrated in no acute distress. Vitals Time Taken: 2:39 PM, Height: 72 in, Weight: 330 lbs, BMI: 44.8, Temperature: 98.3 F, Pulse: 77 bpm, Respiratory Rate: 16 breaths/min, Blood Pressure: 130/76 mmHg. Respiratory normal breathing without difficulty. Psychiatric this patient is  able to make decisions and demonstrates good insight into disease process. Alert and Oriented x 3. pleasant and cooperative. General Notes: Upon inspection patient's wounds again on the left leg seem to be doing much better the right leg these are new and seem to just be more blisters but nonetheless Wilkening wrap this leg as well as definitely larger than the left. Overall I do think that the knee is somewhat better although at this point they are not really can be a way to pack anything down into the wound bed unfortunately. This was discussed with the patient today. Integumentary (Hair, Skin) Tedesco, Blayke L. ([id number]) Wound #1 status is Open. Original cause of wound was Gradually Appeared. The date acquired was: 01/16/2022. The wound has been in treatment 2 weeks. The wound is located on the Left,Midline Lower Leg. The wound measures 0.1cm length x 0.1cm width x 0.1cm depth; 0.008cm^2 area and 0.001cm^3 volume. There is a medium amount of serous drainage noted. Wound #2 status is Open. Original cause of wound was Gradually Appeared. The date acquired was: 01/16/2022. The wound has been in treatment 2 weeks. The wound is located on the Left Knee. The wound measures 0.3cm length x 0.2cm width x 1.2cm depth; 0.047cm^2 area and 0.057cm^3 volume. There is a medium amount of serous drainage noted. Wound #3 status is Open. Original cause of wound was Gradually Appeared. The date acquired was: 01/16/2022. The wound has been in treatment 2 weeks. The wound is located on the [employer]. The wound measures 0.1cm length x 0.1cm width x 0.1cm depth; 0.008cm^2 area and 0.001cm^3 volume. There is a large amount of serous drainage noted. Wound #4 status is Open. Original cause of wound was Blister. The date acquired was: 02/27/2022. The wound is located on the Right,Lateral Lower Leg. The wound measures 0.1cm length x 0.1cm width x 0.1cm depth; 0.008cm^2 area and 0.001cm^3 volume. Wound #5 status  is Open. Original cause of wound  was Gradually Appeared. The date acquired was: 02/27/2022. The wound is located on the Right,Posterior Lower Leg. The wound measures 2.5cm length x 2.3cm width x 0.2cm depth; 4.516cm^2 area and 0.903cm^3 volume. 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 I am good recommend currently that we actually go ahead and initiate treatment with a continuation of the 3 layer compression wraps although again I do both legs this time. 2. Recommend continue with the silver alginate as well. 3. I am also can recommend Zetuvit to cover over the knee location in particular and then subsequently will can use some Tubigrip on the top of the wrap up to above the knee in order to help keep the swelling down in this region. The patient is in agreement with the plan. 4. I am also going to suggest that we go ahead and get him started on an antibiotic. My suggestion right now is probably can to be that we go forward with Bactrim unless there is any need or indication that this is going to be more problematic for him. The patient is in agreement with that plan. This will be short-lived so I do not think we need to worry too much about the potassium levels. We  will see patient back for reevaluation in 1 week here in the clinic. If anything worsens or changes patient will contact our office for additional recommendations. Electronic Signature(s) Signed: 03/04/2022 3:24:05 PM By: Worthy Keeler PA-C Entered By: Worthy Keeler on 03/04/2022 15:24:05 Boss, Rayvon Char ([id number]) -------------------------------------------------------------------------------- SuperBill Details Patient Name: [name]. Date of Service: 03/04/2022 Medical Record Number: [id number] Patient Account Number: [id number] Date of Birth/Sex: [birth year] (66 y.o. M) Treating RN: Cornell Barman Primary Care Provider: Delight Stare Other Clinician: Referring Provider: Delight Stare Treating Provider/Extender: Skipper Cliche in Treatment: 2 Diagnosis Coding ICD-10 Codes Code Description [phone] Type 2 diabetes mellitus with foot ulcer L97.522 Non-pressure chronic ulcer of other part of left foot with fat layer exposed L97.822 Non-pressure chronic ulcer of other part of left lower leg with fat layer exposed L97.812 Non-pressure chronic ulcer of other part of right lower leg with fat layer exposed I10 Essential (primary) hypertension I50.42 Chronic combined systolic (congestive) and diastolic (congestive) heart failure J44.9 Chronic obstructive pulmonary disease, unspecified I48.0 Paroxysmal atrial fibrillation I25.10 Atherosclerotic heart disease of native coronary artery without angina pectoris Z79.01 Long term (current) use of anticoagulants N18.30 Chronic kidney disease, stage 3 unspecified L84 Corns and callosities F17.218 Nicotine dependence, cigarettes, with other nicotine-induced disorders Facility Procedures CPT4: Description Modifier Quantity Code [id number] BILATERAL: Application of multi-layer venous compression system; leg (below knee), including 1 ankle and foot. Physician Procedures CPT4 Code: [id number] Description: [id number] - WC PHYS LEVEL 4 - EST  PT Modifier: Quantity: 1 CPT4 Code: Description: ICD-10 Diagnosis Description E11.621 Type 2 diabetes mellitus with foot ulcer L97.522 Non-pressure chronic ulcer of other part of left foot with fat layer ex [id number]  Non-pressure chronic ulcer of other part of left lower leg with fat lay  L97.812 Non-pressure chronic ulcer of other part of right lower leg with fat la Modifier: posed er exposed yer exposed Quantity: Electronic Signature(s) Signed: 03/04/2022 5:09:07 PM By: Elliot Gurney, BSN, RN, CWS, Kim RN, BSN Signed: 03/04/2022 5:40:09 PM By: Lenda Kelp PA-C Previous Signature: 03/04/2022 3:24:21 PM Version By: Lenda Kelp PA-C Entered By: Elliot Gurney, BSN, RN, CWS, Kim on 03/04/2022 16:11:54
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
## 2022-03-13 ENCOUNTER — Ambulatory Visit: Payer: Medicare HMO | Admitting: Cardiology

## 2022-03-13 ENCOUNTER — Encounter: Payer: Self-pay | Admitting: Cardiology

## 2022-03-13 VITALS — BP 114/62 | HR 78 | Ht 72.0 in | Wt 317.0 lb

## 2022-03-13 DIAGNOSIS — I5020 Unspecified systolic (congestive) heart failure: Secondary | ICD-10-CM

## 2022-03-13 DIAGNOSIS — I4819 Other persistent atrial fibrillation: Secondary | ICD-10-CM

## 2022-03-13 DIAGNOSIS — L089 Local infection of the skin and subcutaneous tissue, unspecified: Secondary | ICD-10-CM | POA: Diagnosis not present

## 2022-03-13 NOTE — Patient Instructions (Signed)
Medication Instructions:  none *If you need a refill on your cardiac medications before your next appointment, please call your pharmacy*   Lab Work: none If you have labs (blood work) drawn today and your tests are completely normal, you will receive your results only by: MyChart Message (if you have MyChart) OR A paper copy in the mail If you have any lab test that is abnormal or we need to change your treatment, we will call you to review the results.   Testing/Procedures: none   Follow-Up: At [facility], you and your health needs are our priority.  As part of our continuing mission to provide you with exceptional heart care, we have created designated Provider Care Teams.  These Care Teams include your primary Cardiologist (physician) and Advanced Practice Providers (APPs -  Physician Assistants and Nurse Practitioners) who all work together to provide you with the care you need, when you need it.  We recommend signing up for the patient portal called "MyChart".  Sign up information is provided on this After Visit Summary.  MyChart is used to connect with patients for Virtual Visits (Telemedicine).  Patients are able to view lab/test results, encounter notes, upcoming appointments, etc.  Non-urgent messages can be sent to your provider as well.   To learn more about what you can do with MyChart, go to [email or web address].    Your next appointment:    As needed with EP- Dr. Lalla Brothers. Continue to follow with  Dr. Mariah Milling.1}   Other Instructions NA Important Information About Sugar

## 2022-03-13 NOTE — Progress Notes (Signed)
Electrophysiology Office Note:    Date:  03/13/2022   ID:  [name], DOB 13-Jun-1956, MRN [id number]  PCP:  Marguerita Merles, MD  [facility] Cardiologist:  Ida Rogue, MD  [facility] Electrophysiologist:  None   Referring MD: Minna Merritts, MD   Chief Complaint: Atrial fibrillation and flutter  History of Present Illness:    [name] is a 66 y.o. male who presents for an evaluation of atrial fibrillation and flutter at the request of Dr. Rockey Situ.  Mr. Bajorek has an extensive medical history that includes coronary artery disease, chronic systolic heart failure, left bundle branch block, diabetes, hypertension, hyperlipidemia, COPD, CKD 3, obesity and tobacco abuse.  He last saw Arain done in follow-up Dec 26, 2021.  In May of this year he reported several weeks of progressively worsening lower extremity edema with wounds.  He also had a new diagnosis of atrial fibrillation and was started on Eliquis.  The wound clinic has been helping him manage his lower extremity wounds.  His echo was repeated and he has an ejection fraction of 55 to 60%.  His diuretics were increased which helped his lower extremity edema.  With the diuresis his symptoms of shortness of breath improved.  The EKG on Dec 26, 2021 suggested atrial fibrillation and he was set up for a cardioversion which was scheduled for January 10, 2022.  His post cardioversion EKG was suggestive of 2-1 AV block.  Today he tells me he is feeling better.  He denies any syncope or presyncope.  No dizziness or lightheadedness.  He has a chronic wound on the leg and was hospitalized in late June with cellulitis and required IV antibiotics.  Wound care has been set up.  He last saw the wound care team on March 04, 2022.   Past Medical History:  Diagnosis Date   CAD (coronary artery disease) 01/07/2014   Cellulitis 12/17/2015   Cellulitis and abscess of foot 01/17/2015   Cellulitis and abscess of foot excluding toe 01/17/2015    Cellulitis of left leg 12/17/2015   Chest pain 11/16/2013   Chronic combined systolic and diastolic CHF (congestive heart failure) ([place]) 0000000   Chronic systolic CHF (congestive heart failure) ([place])    a. 10/2013 Echo: EF 35-40%.   Chronic, continuous use of opioids 08/26/2013   Circulatory system disorder 11/16/2013   Formatting of this note might be different from the original. Last Assessment & Plan:  Possibly secondary to embolic phenomenon versus erythrocytosis. He is currently taking aspirin and Plavix   CKD (chronic kidney disease), stage III ([name])    a. baseline creat 1.3 - 1.5.   Collagen vascular disease ([name])    Congestive dilated cardiomyopathy ([place]) 11/16/2013   COPD (chronic obstructive pulmonary disease) ([name])    Coronary artery disease, non-occlusive    a. 11/2013 MV: distal anteroseptal ischemia;  b. 12/2013 Cath: LM nl, LAD 59m, 90d, RI 70, LCX 42m, 40d, RCA 40p, 50d, RPLS 99, EF 40-45%, mild global HK-->Med rx; c. 03/2016 MV: fixed apical defect (attenuation), no ischemia, EF 38%.   Coronary atherosclerosis 01/07/2014   Formatting of this note might be different from the original. Last Assessment & Plan:  Currently with no symptoms of angina. No further workup at this time. Continue current medication regimen.   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 Last Assessment & Plan:  Recommended he continue his beta blocker, ACE inhibitor, nitrates. Blood pressure borderline low, asymptomatic   Essential hypertension 01/17/2015   Familial multiple lipoprotein-type hyperlipidemia 11/16/2013   Formatting of this note might be different from the original. Last Assessment & Plan:  Encouraged him to stay on his lovastatin. Goal LDL less than 70   Hereditary and idiopathic peripheral neuropathy 01/17/2015   HTN (hypertension) 01/17/2015    Hyperlipidemia    Ischemic cardiomyopathy    a. 10/2013 Echo: EF 35-40%, mild MR/TR;  b. 12/2013 Cath: moderate diffuse CAD-->Med Rx.   Ischemic toe    a. 10/2013 L fifth toe - felt to be either 2/2 embolic plaque vs erythrocytosis - seen by vascular surgery ([place]) ->conservative rx.   Left bundle branch block 11/16/2013   Morbid obesity ([name]) 11/16/2013   Nondependent opioid abuse, continuous ([name]) 08/26/2013   Other chronic pain 08/26/2013   Peripheral neuropathy    Secondary erythrocytosis    a. followed by heme-onc with periodic phlebotomy.   Smoker 11/16/2013   SOB (shortness of breath) 11/16/2013   Tobacco abuse    a. 46 yrs, up to 2 ppd, changed to e-cigarette 05/2013.   Type 2 diabetes mellitus with diabetic neuropathy ([name]) 08/26/2013   Type 2 diabetes mellitus without complications ([name]) 11/16/2013   Formatting of this note might be different from the original. Last Assessment & Plan:  He is working with primary care for improved diabetes control. Still not well controlled. Recommended he watch his carbohydrates, smaller meal portions    Past Surgical History:  Procedure Laterality Date   ACHILLES TENDON SURGERY Left 03/08/2016   Procedure: ACHILLES LENGTHENING/KIDNER;  Surgeon: Gwyneth Revels, DPM;  Location: [facility] ORS;  Service: Podiatry;  Laterality: Left;   CARDIOVERSION N/A 01/10/2022   Procedure: CARDIOVERSION;  Surgeon: Antonieta Iba, MD;  Location: [facility] ORS;  Service: Cardiovascular;  Laterality: N/A;   I & D EXTREMITY Left 01/18/2015   Procedure: IRRIGATION AND DEBRIDEMENT EXTREMITY;  Surgeon: Gwyneth Revels, DPM;  Location: [facility] ORS;  Service: Podiatry;  Laterality: Left;   ORIF TIBIA FRACTURE Left 2008   SKIN DEBRIDEMENT  03/08/2016   Procedure: DEBRIDEMENT SKIN FULL THICKNESS;  Surgeon: Gwyneth Revels, DPM;  Location: [facility] ORS;  Service: Podiatry;;   WOUND EXPLORATION Left 03/10/2015   Procedure: WOUND EXPLORATION/ SECONDARY CLOSURE OF WOUND,COMPLICATED;  Surgeon: Gwyneth Revels,  DPM;  Location: [facility] ORS;  Service: Podiatry;  Laterality: Left;    Current Medications: Current Meds  Medication Sig   albuterol (PROVENTIL HFA;VENTOLIN HFA) 108 (90 BASE) MCG/ACT inhaler Inhale 2 puffs into the lungs every 4 (four) hours as needed for wheezing or shortness of breath.   apixaban (ELIQUIS) 5 MG TABS tablet Take 1 tablet (5 mg total) by mouth 2 (two) times daily.   citalopram (CELEXA) 10 MG tablet Take 10 mg by mouth daily.   furosemide (LASIX) 20 MG tablet Take 1 tablet (20 mg total) by mouth daily.   glipiZIDE (GLUCOTROL XL) 10 MG 24 hr tablet Take 10 mg by mouth daily.   isosorbide mononitrate (IMDUR) 30 MG 24 hr tablet Take 0.5 tablets (15 mg total) by mouth daily.   linagliptin (TRADJENTA) 5 MG TABS tablet Take 5 mg by mouth every morning.    lisinopril (ZESTRIL) 5 MG tablet Take 1 tablet (5 mg total) by mouth daily.   loratadine (CLARITIN) 10 MG tablet Take 10 mg by mouth daily as needed for allergies (seasonal allergies in the spring).  lovastatin (MEVACOR) 40 MG tablet Take 40 mg by mouth at bedtime.   metoprolol succinate (TOPROL XL) 25 MG 24 hr tablet Take 1 tablet (25 mg total) by mouth daily.   nitroGLYCERIN (NITROSTAT) 0.4 MG SL tablet Place 1 tablet (0.4 mg total) under the tongue every 5 (five) minutes as needed for chest pain.   pregabalin (LYRICA) 100 MG capsule Take 100 mg by mouth 3 (three) times daily.   sertraline (ZOLOFT) 100 MG tablet Take 100 mg by mouth at bedtime.   traZODone (DESYREL) 150 MG tablet TAKE 1 TABLET BY MOUTH AT BEDTIME.     Allergies:   Metformin   Social History   Socioeconomic History   Marital status: Significant Other    Spouse name: Not on file   Number of children: Not on file   Years of education: Not on file   Highest education level: Not on file  Occupational History   Not on file  Tobacco Use   Smoking status: Every Day    Packs/day: 0.25    Years: 46.00    Total pack years: 11.50    Types: Cigarettes,  E-cigarettes   Smokeless tobacco: Never   Tobacco comments:    Currently smoking E cigarettes.  Vaping Use   Vaping Use: Never used  Substance and Sexual Activity   Alcohol use: Yes    Comment: alcohol abuse in the past. / rare beer   Drug use: No    Comment: marijuana 5 joints per week in the past 2013   Sexual activity: Yes    Partners: Female  Other Topics Concern   Not on file  Social History Narrative   He lives in [place] with his dtr and son-in-law.  On disability.  Does not routinely exercise.  Eats 6 scoops of banana ice cream every night.   Social Determinants of Health   Financial Resource Strain: Not on file  Food Insecurity: Not on file  Transportation Needs: Not on file  Physical Activity: Not on file  Stress: Not on file  Social Connections: Not on file     Family History: The patient's family history includes Diabetes in his brother; Hyperlipidemia in his brother, brother, brother, and mother; Hypertension in his brother, brother, brother, maternal aunt, and mother.  ROS:   Please see the history of present illness.    All other systems reviewed and are negative.  EKGs/Labs/Other Studies Reviewed:    The following studies were reviewed today:  February 02, 2022 ZIO monitor personally reviewed 100% burden of atrial flutter Ventricular rates 38-82 with an average of 58 bpm Occasional ventricular ectopy No patient triggered events  Dec 10, 2021 echo EF 55 to 60% Normal RV function Trivial MR Mild AI  May 22, 2017 EKG shows sinus rhythm with a left bundle branch block and a first-degree AV delay.  January 10, 2022 EKGs reviewed and show high degree AV block with a left bundle escape in the mid 50s.  The left bundle escape appears identical to his conducted rhythms on prior EKGs.  EKG:  The ekg ordered today demonstrates junctional rhythm and a PVC.  Baseline left bundle branch block.   Recent Labs: 12/06/2021: TSH 3.603 01/31/2022: ALT 13 02/01/2022:  Hemoglobin 14.0; Magnesium 1.9; Platelets 91 02/02/2022: BUN 14; Potassium 3.9; Sodium 135 02/03/2022: Creatinine, Ser 1.31  Recent Lipid Panel No results found for: "CHOL", "TRIG", "HDL", "CHOLHDL", "VLDL", "LDLCALC", "LDLDIRECT"  Physical Exam:    VS:  BP 114/62 (BP Location: Left Arm,  Patient Position: Sitting, Cuff Size: Large)   Pulse 78   Ht 6' (1.829 m)   Wt (!) 317 lb (143.8 kg)   SpO2 96%   BMI 42.99 kg/m     Wt Readings from Last 3 Encounters:  03/13/22 (!) 317 lb (143.8 kg)  01/31/22 (!) 328 lb (148.8 kg)  01/10/22 (!) 328 lb (148.8 kg)     GEN: Chronically ill-appearing, morbidly obese HEENT: Normal NECK: No JVD; No carotid bruits LYMPHATICS: No lymphadenopathy CARDIAC: RRR, no murmurs, rubs, gallops RESPIRATORY:  Clear to auscultation without rales, wheezing or rhonchi  ABDOMEN: Soft, non-tender, non-distended MUSCULOSKELETAL:  No edema; No deformity  SKIN: Dressings in place in the lower legs NEUROLOGIC:  Alert and oriented x 3 PSYCHIATRIC:  Normal affect       ASSESSMENT:    1. HFrEF (heart failure with reduced ejection fraction) ([name])   2. Persistent atrial fibrillation ([name])   3. Soft tissue infection    PLAN:    In order of problems listed above:  #Chronic systolic heart failure NYHA class II-III.  Warm on exam.  EF 55% in May 2023.  Continue metoprolol, lisinopril, Imdur.  #Persistent atrial fibrillation and flutter Now in sinus rhythm with a junctional rhythm versus A-V dissociation.  Continue Eliquis for stroke prophylaxis  #High degree AV block Asymptomatic.  No syncope or presyncope.  No dizziness or lightheadedness.  The patient had high degree AV block on January 10, 2022.  Today's EKG is suggestive of A-V dissociation versus a junctional rhythm.  He has a good chronotropic response with a heart rate in the 70s today with minimal exertion.    Given his active lower extremity wounds, recent infection of these diabetes related wounds requiring IV  antibiotics and lack of symptoms, I would not recommend permanent pacemaker at this time given the prohibitive infection risk.  I will plan to see him back in clinic in about 3 months to confirm no symptoms related to his AV conduction disease.  If he were to develop lightheadedness, dizziness, syncope or presyncope, he should report immediately to the emergency department for evaluation of his heart rhythm and possible permanent pacemaker implant.    Medication Adjustments/Labs and Tests Ordered: Current medicines are reviewed at length with the patient today.  Concerns regarding medicines are outlined above.  No orders of the defined types were placed in this encounter.  No orders of the defined types were placed in this encounter.    Signed, Rossie Muskrat. Lalla Brothers, MD, [facility], [facility] 03/13/2022 5:17 PM    Electrophysiology [facility]

## 2022-03-18 ENCOUNTER — Encounter: Payer: Medicare HMO | Attending: Physician Assistant | Admitting: Physician Assistant

## 2022-03-18 DIAGNOSIS — L84 Corns and callosities: Secondary | ICD-10-CM | POA: Diagnosis not present

## 2022-03-18 DIAGNOSIS — F17218 Nicotine dependence, cigarettes, with other nicotine-induced disorders: Secondary | ICD-10-CM | POA: Insufficient documentation

## 2022-03-18 DIAGNOSIS — I480 Paroxysmal atrial fibrillation: Secondary | ICD-10-CM | POA: Insufficient documentation

## 2022-03-18 DIAGNOSIS — E11622 Type 2 diabetes mellitus with other skin ulcer: Secondary | ICD-10-CM | POA: Diagnosis not present

## 2022-03-18 DIAGNOSIS — I130 Hypertensive heart and chronic kidney disease with heart failure and stage 1 through stage 4 chronic kidney disease, or unspecified chronic kidney disease: Secondary | ICD-10-CM | POA: Insufficient documentation

## 2022-03-18 DIAGNOSIS — N183 Chronic kidney disease, stage 3 unspecified: Secondary | ICD-10-CM | POA: Insufficient documentation

## 2022-03-18 DIAGNOSIS — E11621 Type 2 diabetes mellitus with foot ulcer: Secondary | ICD-10-CM | POA: Insufficient documentation

## 2022-03-18 DIAGNOSIS — Z7901 Long term (current) use of anticoagulants: Secondary | ICD-10-CM | POA: Insufficient documentation

## 2022-03-18 DIAGNOSIS — I2510 Atherosclerotic heart disease of native coronary artery without angina pectoris: Secondary | ICD-10-CM | POA: Diagnosis not present

## 2022-03-18 DIAGNOSIS — L97522 Non-pressure chronic ulcer of other part of left foot with fat layer exposed: Secondary | ICD-10-CM | POA: Diagnosis not present

## 2022-03-18 DIAGNOSIS — J449 Chronic obstructive pulmonary disease, unspecified: Secondary | ICD-10-CM | POA: Insufficient documentation

## 2022-03-18 DIAGNOSIS — L97822 Non-pressure chronic ulcer of other part of left lower leg with fat layer exposed: Secondary | ICD-10-CM | POA: Insufficient documentation

## 2022-03-18 DIAGNOSIS — L97812 Non-pressure chronic ulcer of other part of right lower leg with fat layer exposed: Secondary | ICD-10-CM | POA: Diagnosis not present

## 2022-03-18 DIAGNOSIS — I5042 Chronic combined systolic (congestive) and diastolic (congestive) heart failure: Secondary | ICD-10-CM | POA: Diagnosis not present

## 2022-03-18 DIAGNOSIS — E1122 Type 2 diabetes mellitus with diabetic chronic kidney disease: Secondary | ICD-10-CM | POA: Insufficient documentation

## 2022-03-21 NOTE — Progress Notes (Addendum)
Jeffrey, Diaz ([id number]) Visit Report for 03/18/2022 Chief Complaint Document Details Patient Name: [name], [name]. Date of Service: 03/18/2022 2:30 PM Medical Record Number: [id number] Patient Account Number: [id number] Date of Birth/Sex: 09/20/1955 (66 y.o. M) Treating RN: Huel Coventry Primary Care Provider: Darreld Mclean Other Clinician: Betha Loa Referring Provider: Darreld Mclean Treating Provider/Extender: Rowan Blase in Treatment: 4 Information Obtained from: Patient Chief Complaint Left LE and foot ulcers Electronic Signature(s) Signed: 03/18/2022 2:18:17 PM By: Lenda Kelp PA-C Entered By: Lenda Kelp on 03/18/2022 14:18:16 Staffa, Ivan Anchors ([id number]) -------------------------------------------------------------------------------- HPI Details Patient Name: [name] Date of Service: 03/18/2022 2:30 PM Medical Record Number: [id number] Patient Account Number: [id number] Date of Birth/Sex: 02-Jun-1956 (66 y.o. M) Treating RN: Huel Coventry Primary Care Provider: Darreld Mclean Other Clinician: Betha Loa Referring Provider: Darreld Mclean Treating Provider/Extender: Rowan Blase in Treatment: 4 History of Present Illness HPI Description: 02-15-2022 patient presents for initial evaluation here in the clinic concerning issues that he is having currently with wounds of lower extremity which has been present for about the past month in general this is on the leg as well as the toe. Neither 1 of these actually appear to be too significant which is good news. Fortunately I do not see any evidence of active infection locally or systemically which is also great news. He was in the hospital 2 weeks ago for infection and was treated and doing much better at this point. With that being said he also may have some possible issues here with alcohol and marijuana usage although I do not know for sure there is just some illusion to this between him and his  brother-in-law Zetuvit talking in the room today. With that being said I do think that based on what I am seeing currently the patient does have some fairly concerning issues with the fact that he is a diabetic though he is very well controlled this is good news is hemoglobin A1c is 5.9. With that being said there is some evidence of a more significant wound at around the upper tibia/knee area on the left leg and this is at the site of a surgical location where he actually has some tracking down to hardware I am wondering if this is a draining sinus from possibly hardware infection situation. The patient states that this area has been open for about 6 months and may be more and it is intermittently back-and-forth that he will be put in a little bit of antibiotic ointment on it it will seem to close then reopen again. Again this sounds like something that is been more chronic to be honest. The patient does have a history of diabetes mellitus type 2, hypertension, congestive heart failure, COPD, atrial fibrillation, long-term use of anticoagulants due to this, coronary artery disease, and chronic kidney disease stage III per patient's disclosures and chart review. I did also review the patient's ABIs which showed a right ABI of 1.2 with a left ABI of 1.2 as well but normal arterial waveforms at the ankle bilaterally there was no evidence of arterial occlusive disease and this was performed on 02-01-2022. I also reviewed the patient's left tibia and fibula x-ray as well which shows status post plate and screw fixation of the lateral tibial plateau with intact hardware no fracture dislocation no cortical erosion or periosteal reaction was obvious. The patient did have subcutaneous tissue edema and skin thickening suggestive of cellulitis. That was on 01-31-2022. Lastly I did also review the  foot x-ray which showed no fracture or dislocation in the left foot there was no focal lytic lesions. That was  performed on 01-31-2022. 03-04-2022 upon evaluation today patient appears to be doing a little better in regard to his left leg in general unfortunately the right leg has opened at this point. Fortunately I do not see any evidence of active infection locally or systemically which is great news other than at the left knee where there is definitely warm to touch here. I am concerned because this still does probe down to bone/hardware location but again there was no definitive signs of hardware infection or osteomyelitis noted on MRI just cellulitis was noted this is at least good news primarily. 03-18-2022 upon evaluation today patient appears to be doing excellent in regard to his wound. He has been tolerating dressing changes without complication. The area of the knee is the one spot that still open though it does not appear to be doing nearly as bad as what it was previous. I do think possibly switching over to endoform may be a good option here. Electronic Signature(s) Signed: 03/18/2022 4:38:28 PM By: Worthy Keeler PA-C Entered By: Worthy Keeler on 03/18/2022 16:38:27 Virgina Organ ([id number]) -------------------------------------------------------------------------------- Physical Exam Details Patient Name: Jeffrey Pop L. Date of Service: 03/18/2022 2:30 PM Medical Record Number: [id number] Patient Account Number: [id number] Date of Birth/Sex: May 17, 1956 (66 y.o. M) Treating RN: Cornell Barman Primary Care Provider: Delight Stare Other Clinician: Massie Kluver Referring Provider: Delight Stare Treating Provider/Extender: Skipper Cliche in Treatment: 4 Constitutional Well-nourished and well-hydrated in no acute distress. Respiratory normal breathing without difficulty. Psychiatric this patient is able to make decisions and demonstrates good insight into disease process. Alert and Oriented x 3. pleasant and cooperative. Notes Upon inspection patient's wound bed actually showed  signs of good granulation and epithelization at this point. Fortunately I do not see any signs of infection which is great news and overall I am extremely pleased with where we are today. Electronic Signature(s) Signed: 03/18/2022 4:38:42 PM By: Worthy Keeler PA-C Entered By: Worthy Keeler on 03/18/2022 16:38:42 Virgina Organ ([id number]) -------------------------------------------------------------------------------- Physician Orders Details Patient Name: Virgina Organ Date of Service: 03/18/2022 2:30 PM Medical Record Number: [id number] Patient Account Number: [id number] Date of Birth/Sex: 1956/05/30 (66 y.o. M) Treating RN: Cornell Barman Primary Care Provider: Delight Stare Other Clinician: Massie Kluver Referring Provider: Delight Stare Treating Provider/Extender: Skipper Cliche in Treatment: 4 Verbal / Phone Orders: No Diagnosis Coding ICD-10 Coding Code Description E11.621 Type 2 diabetes mellitus with foot ulcer L97.522 Non-pressure chronic ulcer of other part of left foot with fat layer exposed L97.822 Non-pressure chronic ulcer of other part of left lower leg with fat layer exposed L97.812 Non-pressure chronic ulcer of other part of right lower leg with fat layer exposed I10 Essential (primary) hypertension I50.42 Chronic combined systolic (congestive) and diastolic (congestive) heart failure J44.9 Chronic obstructive pulmonary disease, unspecified I48.0 Paroxysmal atrial fibrillation I25.10 Atherosclerotic heart disease of native coronary artery without angina pectoris Z79.01 Long term (current) use of anticoagulants N18.30 Chronic kidney disease, stage 3 unspecified L84 Corns and callosities F17.218 Nicotine dependence, cigarettes, with other nicotine-induced disorders Follow-up Appointments o Return Appointment in 2 weeks. [facility] for wound care. May utilize formulary equivalent dressing for  wound treatment orders unless otherwise specified. Home Health Nurse may visit PRN to address patientos wound care needs. o Scheduled days for dressing changes to  be completed; exception, patient has scheduled wound care visit that day. o **Please direct any NON-WOUND related issues/requests for orders to patient's Primary Care Physician. **If current dressing causes regression in wound condition, may D/C ordered dressing product/s and apply Normal Saline Moist Dressing daily until next [place] or Other MD appointment. **Notify [facility]aling Center of regression in wound condition at [phone]. Bathing/ Shower/ Hygiene o May shower with wound dressing protected with water repellent cover or cast protector. Edema Control - Lymphedema / Segmental Compressive Device / Other o Tubigrip double layer applied - Tubi D double layer B/L lower extremity Wound Treatment Wound #2 - Knee Wound Laterality: Left Cleanser: Soap and Water (Home Health) 3 x Per Week/30 Days Discharge Instructions: Gently cleanse leg and foot with antibacterial soap, rinse and pat dry prior to dressing wounds Cleanser: Wound Cleanser (Home Health) 3 x Per Week/30 Days Discharge Instructions: Wash your hands with soap and water. Remove old dressing, discard into plastic bag and place into trash. Cleanse the wound with Wound Cleanser prior to applying a clean dressing using gauze sponges, not tissues or cotton balls. Do not scrub or use excessive force. Pat dry using gauze sponges, not tissue or cotton balls. Primary Dressing: Endoform Natural, Non-fenestrated, 2x2 (in/in) 3 x Per Week/30 Days Discharge Instructions: cut thin strip to pack into wound Secondary Dressing: (BORDER) Zetuvit Plus SILICONE BORDER Dressing 4x4 (in/in) (Home Health) 3 x Per Week/30 Days Discharge Instructions: Please do not put silicone bordered dressings under wraps. Use non-bordered dressing only. Secured With: Tubigrip Size D,  3x10 (in/yd) 3 x Per Week/30 Days CHRISTHOPHER, CIMORELLI. ([id number]) Discharge Instructions: over knee for swelling double layer Electronic Signature(s) Signed: 03/18/2022 4:45:05 PM By: Worthy Keeler PA-C Signed: 03/20/2022 1:47:09 PM By: Massie Kluver Entered By: Massie Kluver on 03/18/2022 15:30:18 Zabriskie, Joesiah L. ([id number]) -------------------------------------------------------------------------------- Problem List Details Patient Name: Uplinger, Manual L. Date of Service: 03/18/2022 2:30 PM Medical Record Number: [id number] Patient Account Number: [id number] Date of Birth/Sex: 1956/07/10 (66 y.o. M) Treating RN: Cornell Barman Primary Care Provider: Delight Stare Other Clinician: Massie Kluver Referring Provider: Delight Stare Treating Provider/Extender: Skipper Cliche in Treatment: 4 Active Problems ICD-10 Encounter Code Description Active Date MDM Diagnosis E11.621 Type 2 diabetes mellitus with foot ulcer 02/15/2022 No Yes L97.522 Non-pressure chronic ulcer of other part of left foot with fat layer 02/15/2022 No Yes exposed L97.822 Non-pressure chronic ulcer of other part of left lower leg with fat layer 02/15/2022 No Yes exposed L97.812 Non-pressure chronic ulcer of other part of right lower leg with fat layer 03/04/2022 No Yes exposed [place] (primary) hypertension 02/15/2022 No Yes I50.42 Chronic combined systolic (congestive) and diastolic (congestive) heart 02/15/2022 No Yes failure J44.9 Chronic obstructive pulmonary disease, unspecified 02/15/2022 No Yes I48.0 Paroxysmal atrial fibrillation 02/15/2022 No Yes I25.10 Atherosclerotic heart disease of native coronary artery without angina 02/15/2022 No Yes pectoris Z79.01 Long term (current) use of anticoagulants 02/15/2022 No Yes N18.30 Chronic kidney disease, stage 3 unspecified 02/15/2022 No Yes L84 Corns and callosities 02/15/2022 No Yes [place], Kenn L. ([id number]) F17.218 Nicotine dependence, cigarettes, with  other nicotine-induced disorders 03/04/2022 No Yes Inactive Problems Resolved Problems Electronic Signature(s) Signed: 03/18/2022 2:18:13 PM By: Worthy Keeler PA-C Entered By: Worthy Keeler on 03/18/2022 [place], Jakarius L. ([id number]) -------------------------------------------------------------------------------- Progress Note Details Patient Name: Virgina Organ. Date of Service: 03/18/2022 2:30 PM Medical Record Number: [id number] Patient Account Number: [id number] Date of Birth/Sex: August 29, 1955 (66 y.o. M) Treating RN: Cornell Barman  Primary Care Provider: Delight Stare Other Clinician: Massie Kluver Referring Provider: Delight Stare Treating Provider/Extender: Skipper Cliche in Treatment: 4 Subjective Chief Complaint Information obtained from Patient Left LE and foot ulcers History of Present Illness (HPI) 02-15-2022 patient presents for initial evaluation here in the clinic concerning issues that he is having currently with wounds of lower extremity which has been present for about the past month in general this is on the leg as well as the toe. Neither 1 of these actually appear to be too significant which is good news. Fortunately I do not see any evidence of active infection locally or systemically which is also great news. He was in the hospital 2 weeks ago for infection and was treated and doing much better at this point. With that being said he also may have some possible issues here with alcohol and marijuana usage although I do not know for sure there is just some illusion to this between him and his brother-in- law Zetuvit talking in the room today. With that being said I do think that based on what I am seeing currently the patient does have some fairly concerning issues with the fact that he is a diabetic though he is very well controlled this is good news is hemoglobin A1c is 5.9. With that being said there is some evidence of a more significant wound at  around the upper tibia/knee area on the left leg and this is at the site of a surgical location where he actually has some tracking down to hardware I am wondering if this is a draining sinus from possibly hardware infection situation. The patient states that this area has been open for about 6 months and may be more and it is intermittently back-and-forth that he will be put in a little bit of antibiotic ointment on it it will seem to close then reopen again. Again this sounds like something that is been more chronic to be honest. The patient does have a history of diabetes mellitus type 2, hypertension, congestive heart failure, COPD, atrial fibrillation, long-term use of anticoagulants due to this, coronary artery disease, and chronic kidney disease stage III per patient's disclosures and chart review. I did also review the patient's ABIs which showed a right ABI of 1.2 with a left ABI of 1.2 as well but normal arterial waveforms at the ankle bilaterally there was no evidence of arterial occlusive disease and this was performed on 02-01-2022. I also reviewed the patient's left tibia and fibula x-ray as well which shows status post plate and screw fixation of the lateral tibial plateau with intact hardware no fracture dislocation no cortical erosion or periosteal reaction was obvious. The patient did have subcutaneous tissue edema and skin thickening suggestive of cellulitis. That was on 01-31-2022. Lastly I did also review the foot x-ray which showed no fracture or dislocation in the left foot there was no focal lytic lesions. That was performed on 01-31-2022. 03-04-2022 upon evaluation today patient appears to be doing a little better in regard to his left leg in general unfortunately the right leg has opened at this point. Fortunately I do not see any evidence of active infection locally or systemically which is great news other than at the left knee where there is definitely warm to touch here. I am  concerned because this still does probe down to bone/hardware location but again there was no definitive signs of hardware infection or osteomyelitis noted on MRI just cellulitis was noted this is at  least good news primarily. 03-18-2022 upon evaluation today patient appears to be doing excellent in regard to his wound. He has been tolerating dressing changes without complication. The area of the knee is the one spot that still open though it does not appear to be doing nearly as bad as what it was previous. I do think possibly switching over to endoform may be a good option here. Objective Constitutional Well-nourished and well-hydrated in no acute distress. Vitals Time Taken: 2:48 PM, Height: 72 in, Weight: 330 lbs, BMI: 44.8, Temperature: 97.8 F, Pulse: 71 bpm, Respiratory Rate: 16 breaths/min, Blood Pressure: 169/95 mmHg. Respiratory normal breathing without difficulty. Psychiatric this patient is able to make decisions and demonstrates good insight into disease process. Alert and Oriented x 3. pleasant and cooperative. General Notes: Upon inspection patient's wound bed actually showed signs of good granulation and epithelization at this point. Fortunately I do not see any signs of infection which is great news and overall I am extremely pleased with where we are today. MUADH, CREASY L. ([id number]) Integumentary (Hair, Skin) Wound #2 status is Open. Original cause of wound was Gradually Appeared. The date acquired was: 01/16/2022. The wound has been in treatment 4 weeks. The wound is located on the Left Knee. The wound measures 0.4cm length x 0.8cm width x 0.3cm depth; 0.251cm^2 area and 0.075cm^3 volume. There is a medium amount of serous drainage noted. Wound #5 status is Healed - Epithelialized. Original cause of wound was Gradually Appeared. The date acquired was: 02/27/2022. The wound has been in treatment 2 weeks. The wound is located on the Right,Posterior Lower Leg. The wound  measures 0cm length x 0cm width x 0cm depth; 0cm^2 area and 0cm^3 volume. There is a small amount of drainage noted. 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 Home Health: Home Health Company: - [facility] for wound care. May utilize formulary equivalent dressing for wound treatment orders unless otherwise specified. Home Health Nurse may visit PRN to address patient s wound care needs. Scheduled days for dressing changes to be completed; exception, patient has scheduled wound care visit that day. **Please direct any NON-WOUND related issues/requests for orders to patient's Primary Care Physician. **If current dressing causes regression in wound condition, may D/C ordered dressing product/s and apply Normal Saline Moist Dressing daily until next Wound Healing Center or Other MD appointment. **Notify Wound [facility]aling Center of regression in wound condition at [phone]. Bathing/ Shower/ Hygiene: May shower with wound dressing protected with water repellent cover or cast protector. Edema Control - Lymphedema / Segmental Compressive Device / Other: Tubigrip double layer applied - Tubi D double  layer B/L lower extremity WOUND #2: - Knee Wound Laterality: Left Cleanser: Soap and Water (Home Health) 3 x Per Week/30 Days Discharge Instructions: Gently cleanse leg and foot with antibacterial soap, rinse and pat dry prior to dressing wounds Cleanser: Wound Cleanser (Home  Health) 3 x Per Week/30 Days Discharge Instructions: Wash your hands with soap and water. Remove old dressing, discard into plastic bag and place into trash. Cleanse the wound with Wound Cleanser prior to applying a clean dressing using gauze sponges, not tissues or cotton balls. Do not scrub or use excessive force. Pat dry using gauze sponges, not tissue or cotton balls. Primary Dressing: Endoform Natural, Non-fenestrated, 2x2 (in/in) 3 x Per Week/30 Days Discharge Instructions: cut thin strip to pack into wound Secondary Dressing: (BORDER) Zetuvit Plus SILICONE BORDER Dressing 4x4 (in/in) (Home Health) 3 x Per Week/30 Days Discharge Instructions: Please do not put silicone bordered dressings under wraps. Use non-bordered dressing only. Secured With: Tubigrip Size D, 3x10 (in/yd) 3 x Per Week/30 Days Discharge Instructions: over knee for swelling double layer 1. I am going to recommend that we going continue with the wound care measures as before and the patient is in agreement with plan this includes the use of the endoform which is going to be the 1 change we make. We will continue to cover this with a bordered foam dressing. 2. Would recommend Tubigrip size D double layer bilaterally to the lower extremities. 3. I am also can recommend the patient should get compression socks we have given that information today and we will subsequently see if he can get those and subsequently actually get those on as well. That would make all the difference in the world. RADU, MISIASZEK ([id number]) We will see patient back for reevaluation in 1 week here in the clinic. If anything worsens or changes patient will contact our office  for additional recommendations. Electronic Signature(s) Signed: 03/18/2022 4:39:26 PM By: Worthy Keeler PA-C Entered By: Worthy Keeler on 03/18/2022 16:39:26 Rorke, Rayvon Char ([id number]) -------------------------------------------------------------------------------- SuperBill Details Patient Name: Virgina Organ. Date of Service: 03/18/2022 Medical Record Number: [id number] Patient Account Number: [id number] Date of Birth/Sex: [birth year] (66 y.o. M) Treating RN: Cornell Barman Primary Care Provider: Delight Stare Other Clinician: Massie Kluver Referring Provider: Delight Stare Treating Provider/Extender: Skipper Cliche in Treatment: 4 Diagnosis Coding ICD-10 Codes Code Description [phone] Type 2 diabetes mellitus with foot ulcer L97.522 Non-pressure chronic ulcer of other part of left foot with fat layer exposed L97.822 Non-pressure chronic ulcer of other part of left lower leg with fat layer exposed L97.812 Non-pressure chronic ulcer of other part of right lower leg with fat layer exposed I10 Essential (primary) hypertension I50.42 Chronic combined systolic (congestive) and diastolic (congestive) heart failure J44.9 Chronic obstructive pulmonary disease, unspecified I48.0 Paroxysmal atrial fibrillation I25.10 Atherosclerotic heart disease of native coronary artery without angina pectoris Z79.01 Long term (current) use of anticoagulants N18.30 Chronic kidney disease, stage 3 unspecified L84 Corns and callosities F17.218 Nicotine dependence, cigarettes, with other nicotine-induced disorders Facility Procedures CPT4 Code: [id number] Description: 99213 - WOUND CARE VISIT-LEV 3 EST PT Modifier: Quantity: 1 Physician Procedures CPT4 Code: [id number] Description: 99213 - WC PHYS LEVEL 3 - EST PT Modifier: Quantity: 1 CPT4 Code: Description: ICD-10 Diagnosis Description E11.621 Type 2 diabetes mellitus with foot ulcer L97.522 Non-pressure chronic ulcer of other part of left foot  with fat layer ex [id number] Non-pressure chronic ulcer of other part of left lower leg with fat lay  L97.812 Non-pressure chronic ulcer of other part of right lower leg with fat la Modifier: posed er exposed yer exposed Quantity: Electronic Signature(s) Signed: 03/18/2022 4:39:47 PM By: Worthy Keeler PA-C Entered By: Worthy Keeler on 03/18/2022 16:39:47
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
## 2022-04-02 ENCOUNTER — Encounter: Payer: Medicare HMO | Admitting: Physician Assistant

## 2022-04-02 DIAGNOSIS — E11621 Type 2 diabetes mellitus with foot ulcer: Secondary | ICD-10-CM | POA: Diagnosis not present
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
## 2022-04-02 NOTE — Progress Notes (Addendum)
CLEOTHA, TSANG ([id number]) Visit Report for 04/02/2022 Chief Complaint Document Details Patient Name: [name], [name]. Date of Service: 04/02/2022 2:15 PM Medical Record Number: [id number] Patient Account Number: [id number] Date of Birth/Sex: 04/17/1956 (66 y.o. M) Treating RN: Huel Coventry Primary Care Provider: Darreld Mclean Other Clinician: Betha Loa Referring Provider: Darreld Mclean Treating Provider/Extender: Rowan Blase in Treatment: 6 Information Obtained from: Patient Chief Complaint Left LE and foot ulcers Electronic Signature(s) Signed: 04/02/2022 2:05:44 PM By: Lenda Kelp PA-C Entered By: Lenda Kelp on 04/02/2022 14:05:44 [place], [name] ([id number]) -------------------------------------------------------------------------------- HPI Details Patient Name: [name] Date of Service: 04/02/2022 2:15 PM Medical Record Number: [id number] Patient Account Number: [id number] Date of Birth/Sex: March 27, 1956 (66 y.o. M) Treating RN: Huel Coventry Primary Care Provider: Darreld Mclean Other Clinician: Betha Loa Referring Provider: Darreld Mclean Treating Provider/Extender: Rowan Blase in Treatment: 6 History of Present Illness HPI Description: 02-15-2022 patient presents for initial evaluation here in the clinic concerning issues that he is having currently with wounds of lower extremity which has been present for about the past month in general this is on the leg as well as the toe. Neither 1 of these actually appear to be too significant which is good news. Fortunately I do not see any evidence of active infection locally or systemically which is also great news. He was in the hospital 2 weeks ago for infection and was treated and doing much better at this point. With that being said he also may have some possible issues here with alcohol and marijuana usage although I do not know for sure there is just some illusion to this between him and his  brother-in-law Zetuvit talking in the room today. With that being said I do think that based on what I am seeing currently the patient does have some fairly concerning issues with the fact that he is a diabetic though he is very well controlled this is good news is hemoglobin A1c is 5.9. With that being said there is some evidence of a more significant wound at around the upper tibia/knee area on the left leg and this is at the site of a surgical location where he actually has some tracking down to hardware I am wondering if this is a draining sinus from possibly hardware infection situation. The patient states that this area has been open for about 6 months and may be more and it is intermittently back-and-forth that he will be put in a little bit of antibiotic ointment on it it will seem to close then reopen again. Again this sounds like something that is been more chronic to be honest. The patient does have a history of diabetes mellitus type 2, hypertension, congestive heart failure, COPD, atrial fibrillation, long-term use of anticoagulants due to this, coronary artery disease, and chronic kidney disease stage III per patient's disclosures and chart review. I did also review the patient's ABIs which showed a right ABI of 1.2 with a left ABI of 1.2 as well but normal arterial waveforms at the ankle bilaterally there was no evidence of arterial occlusive disease and this was performed on 02-01-2022. I also reviewed the patient's left tibia and fibula x-ray as well which shows status post plate and screw fixation of the lateral tibial plateau with intact hardware no fracture dislocation no cortical erosion or periosteal reaction was obvious. The patient did have subcutaneous tissue edema and skin thickening suggestive of cellulitis. That was on 01-31-2022. Lastly I did also review the  foot x-ray which showed no fracture or dislocation in the left foot there was no focal lytic lesions. That was  performed on 01-31-2022. 03-04-2022 upon evaluation today patient appears to be doing a little better in regard to his left leg in general unfortunately the right leg has opened at this point. Fortunately I do not see any evidence of active infection locally or systemically which is great news other than at the left knee where there is definitely warm to touch here. I am concerned because this still does probe down to bone/hardware location but again there was no definitive signs of hardware infection or osteomyelitis noted on MRI just cellulitis was noted this is at least good news primarily. 03-18-2022 upon evaluation today patient appears to be doing excellent in regard to his wound. He has been tolerating dressing changes without complication. The area of the knee is the one spot that still open though it does not appear to be doing nearly as bad as what it was previous. I do think possibly switching over to endoform may be a good option here. 04-02-2022 upon evaluation today patient continues to have problems currently with his knee everything else is pretty much closed at this point. Unfortunately he has been having some issues with continued drainage at this point. Fortunately I do not see any signs of active infection locally or systemically at this time which is great news. No fevers, chills, nausea, vomiting, or diarrhea. Electronic Signature(s) Signed: 04/02/2022 4:14:48 PM By: Worthy Keeler PA-C Entered By: Worthy Keeler on 04/02/2022 16:14:47 [place], [name] ([id number]) -------------------------------------------------------------------------------- Physical Exam Details Patient Name: [name]. Date of Service: 04/02/2022 2:15 PM Medical Record Number: [id number] Patient Account Number: [id number] Date of Birth/Sex: 05/06/1956 (66 y.o. M) Treating RN: Cornell Barman Primary Care Provider: Delight Stare Other Clinician: Massie Kluver Referring Provider: Delight Stare Treating  Provider/Extender: Skipper Cliche in Treatment: 6 Constitutional Well-nourished and well-hydrated in no acute distress. Respiratory normal breathing without difficulty. Psychiatric this patient is able to make decisions and demonstrates good insight into disease process. Alert and Oriented x 3. pleasant and cooperative. Notes Upon inspection patient's wound still did probe to bone on the medial aspect towards the patella region. Again we have continued to probe down to bone and/or hardware the entire time of seeing him. I do not feel like this is feeling inasmuch as I would have liked up to this point. We have been using endoform. With that being said I think it may be worthwhile for [place] to get him back to see his orthopedic surgeon I am not sure if his surgeon is still at [facility] clinic he could not tell me who it was that had performed the original surgery but this was not [facility] clinic orthopedics. Working to see about making that referral for him. Electronic Signature(s) Signed: 04/02/2022 4:15:32 PM By: Worthy Keeler PA-C Entered By: Worthy Keeler on 04/02/2022 16:15:32 [place], [name] ([id number]) -------------------------------------------------------------------------------- Physician Orders Details Patient Name: [name] Date of Service: 04/02/2022 2:15 PM Medical Record Number: [id number] Patient Account Number: [id number] Date of Birth/Sex: 01/13/1956 (66 y.o. M) Treating RN: Cornell Barman Primary Care Provider: Delight Stare Other Clinician: Massie Kluver Referring Provider: Delight Stare Treating Provider/Extender: Skipper Cliche in Treatment: 6 Verbal / Phone Orders: No Diagnosis Coding ICD-10 Coding Code Description E11.621 Type 2 diabetes mellitus with foot ulcer L97.522 Non-pressure chronic ulcer of other part of left foot with fat layer exposed L97.822 Non-pressure chronic ulcer  of other part of left lower leg with fat layer exposed L97.812  Non-pressure chronic ulcer of other part of right lower leg with fat layer exposed I10 Essential (primary) hypertension I50.42 Chronic combined systolic (congestive) and diastolic (congestive) heart failure J44.9 Chronic obstructive pulmonary disease, unspecified I48.0 Paroxysmal atrial fibrillation I25.10 Atherosclerotic heart disease of native coronary artery without angina pectoris Z79.01 Long term (current) use of anticoagulants N18.30 Chronic kidney disease, stage 3 unspecified L84 Corns and callosities F17.218 Nicotine dependence, cigarettes, with other nicotine-induced disorders Follow-up Appointments o Return Appointment in 2 weeks. [place] for wound care. May utilize formulary equivalent dressing for wound treatment orders unless otherwise specified. Home Health Nurse may visit PRN to address patientos wound care needs. o Scheduled days for dressing changes to be completed; exception, patient has scheduled wound care visit that day. o **Please direct any NON-WOUND related issues/requests for orders to patient's Primary Care Physician. **If current dressing causes regression in wound condition, may D/C ordered dressing product/s and apply Normal Saline Moist Dressing daily until next [place] or Other MD appointment. **Notify Wound Healing Center of regression in wound condition at [phone]. Bathing/ Shower/ Hygiene o May shower with wound dressing protected with water repellent cover or cast protector. Edema Control - Lymphedema / Segmental Compressive Device / Other o Tubigrip double layer applied - Tubi D double layer B/L lower extremity Wound Treatment Wound #2 - Knee Wound Laterality: Left Cleanser: Soap and Water (Home Health) 3 x Per Week/30 Days Discharge Instructions: Gently cleanse leg and foot with antibacterial soap, rinse and pat dry prior to dressing wounds Cleanser: Wound  Cleanser (Home Health) 3 x Per Week/30 Days Discharge Instructions: Wash your hands with soap and water. Remove old dressing, discard into plastic bag and place into trash. Cleanse the wound with Wound Cleanser prior to applying a clean dressing using gauze sponges, not tissues or cotton balls. Do not scrub or use excessive force. Pat dry using gauze sponges, not tissue or cotton balls. Primary Dressing: Endoform Natural, Non-fenestrated, 2x2 (in/in) 3 x Per Week/30 Days Discharge Instructions: cut thin strip to pack into wound Secondary Dressing: (BORDER) Zetuvit Plus SILICONE BORDER Dressing 4x4 (in/in) (Home Health) 3 x Per Week/30 Days Discharge Instructions: Please do not put silicone bordered dressings under wraps. Use non-bordered dressing only. Secured With: Tubigrip Size D, 3x10 (in/yd) 3 x Per Week/30 Days NATHANE, SABALLOS. ([id number]) Discharge Instructions: over knee for swelling double layer Consults o Orthopedics - referral back to ortho provider at [place] Signature(s) Signed: 04/02/2022 4:37:11 PM By: Worthy Keeler PA-C Signed: 04/04/2022 4:44:47 PM By: Massie Kluver Entered By: Massie Kluver on 04/02/2022 [place], [place]. ([id number]) -------------------------------------------------------------------------------- Problem List Details Patient Name: [name], [name] Date of Service: 04/02/2022 2:15 PM Medical Record Number: [id number] Patient Account Number: [id number] Date of Birth/Sex: [birth year] (66 y.o. M) Treating RN: Cornell Barman Primary Care Provider: Delight Stare Other Clinician: Massie Kluver Referring Provider: Delight Stare Treating Provider/Extender: Skipper Cliche in Treatment: 6 Active Problems ICD-10 Encounter Code Description Active Date MDM Diagnosis E11.621 Type 2 diabetes mellitus with foot ulcer 02/15/2022 No Yes L97.522 Non-pressure chronic ulcer of other part of left foot with fat layer 02/15/2022 No  Yes exposed L97.822 Non-pressure chronic ulcer of other part of left lower leg with fat layer 02/15/2022 No Yes exposed L97.812 Non-pressure chronic ulcer of other part of right lower leg with fat layer 03/04/2022 No Yes  exposed [place] (primary) hypertension 02/15/2022 No Yes I50.42 Chronic combined systolic (congestive) and diastolic (congestive) heart 02/15/2022 No Yes failure J44.9 Chronic obstructive pulmonary disease, unspecified 02/15/2022 No Yes I48.0 Paroxysmal atrial fibrillation 02/15/2022 No Yes I25.10 Atherosclerotic heart disease of native coronary artery without angina 02/15/2022 No Yes pectoris Z79.01 Long term (current) use of anticoagulants 02/15/2022 No Yes N18.30 Chronic kidney disease, stage 3 unspecified 02/15/2022 No Yes L84 Corns and callosities 02/15/2022 No Yes [place], [name] ([id number]) F17.218 Nicotine dependence, cigarettes, with other nicotine-induced disorders 03/04/2022 No Yes Inactive Problems Resolved Problems Electronic Signature(s) Signed: 04/02/2022 2:05:40 PM By: Worthy Keeler PA-C Entered By: Worthy Keeler on 04/02/2022 14:05:39 [place], Jon L. ([id number]) -------------------------------------------------------------------------------- Progress Note Details Patient Name: [name], [name] Date of Service: 04/02/2022 2:15 PM Medical Record Number: [id number] Patient Account Number: [id number] Date of Birth/Sex: [birth year] (65 y.o. M) Treating RN: Cornell Barman Primary Care Provider: Delight Stare Other Clinician: Massie Kluver Referring Provider: Delight Stare Treating Provider/Extender: Skipper Cliche in Treatment: 6 Subjective Chief Complaint Information obtained from Patient Left LE and foot ulcers History of Present Illness (HPI) 02-15-2022 patient presents for initial evaluation here in the clinic concerning issues that he is having currently with wounds of lower extremity which has been present for about the past month in general  this is on the leg as well as the toe. Neither 1 of these actually appear to be too significant which is good news. Fortunately I do not see any evidence of active infection locally or systemically which is also great news. He was in the hospital 2 weeks ago for infection and was treated and doing much better at this point. With that being said he also may have some possible issues here with alcohol and marijuana usage although I do not know for sure there is just some illusion to this between him and his brother-in- law Zetuvit talking in the room today. With that being said I do think that based on what I am seeing currently the patient does have some fairly concerning issues with the fact that he is a diabetic though he is very well controlled this is good news is hemoglobin A1c is 5.9. With that being said there is some evidence of a more significant wound at around the upper tibia/knee area on the left leg and this is at the site of a surgical location where he actually has some tracking down to hardware I am wondering if this is a draining sinus from possibly hardware infection situation. The patient states that this area has been open for about 6 months and may be more and it is intermittently back-and-forth that he will be put in a little bit of antibiotic ointment on it it will seem to close then reopen again. Again this sounds like something that is been more chronic to be honest. The patient does have a history of diabetes mellitus type 2, hypertension, congestive heart failure, COPD, atrial fibrillation, long-term use of anticoagulants due to this, coronary artery disease, and chronic kidney disease stage III per patient's disclosures and chart review. I did also review the patient's ABIs which showed a right ABI of 1.2 with a left ABI of 1.2 as well but normal arterial waveforms at the ankle bilaterally there was no evidence of arterial occlusive disease and this was performed on  02-01-2022. I also reviewed the patient's left tibia and fibula x-ray as well which shows status post plate and screw fixation of the lateral tibial plateau  with intact hardware no fracture dislocation no cortical erosion or periosteal reaction was obvious. The patient did have subcutaneous tissue edema and skin thickening suggestive of cellulitis. That was on 01-31-2022. Lastly I did also review the foot x-ray which showed no fracture or dislocation in the left foot there was no focal lytic lesions. That was performed on 01-31-2022. 03-04-2022 upon evaluation today patient appears to be doing a little better in regard to his left leg in general unfortunately the right leg has opened at this point. Fortunately I do not see any evidence of active infection locally or systemically which is great news other than at the left knee where there is definitely warm to touch here. I am concerned because this still does probe down to bone/hardware location but again there was no definitive signs of hardware infection or osteomyelitis noted on MRI just cellulitis was noted this is at least good news primarily. 03-18-2022 upon evaluation today patient appears to be doing excellent in regard to his wound. He has been tolerating dressing changes without complication. The area of the knee is the one spot that still open though it does not appear to be doing nearly as bad as what it was previous. I do think possibly switching over to endoform may be a good option here. 04-02-2022 upon evaluation today patient continues to have problems currently with his knee everything else is pretty much closed at this point. Unfortunately he has been having some issues with continued drainage at this point. Fortunately I do not see any signs of active infection locally or systemically at this time which is great news. No fevers, chills, nausea, vomiting, or diarrhea. Objective Constitutional Well-nourished and well-hydrated in no acute  distress. Vitals Time Taken: 2:15 PM, Height: 72 in, Weight: 330 lbs, BMI: 44.8, Temperature: 97.7 F, Pulse: 78 bpm, Respiratory Rate: 18 breaths/min, Blood Pressure: 160/84 mmHg. Respiratory normal breathing without difficulty. Psychiatric this patient is able to make decisions and demonstrates good insight into disease process. Alert and Oriented x 3. pleasant and cooperative. [name], [name] ([id number]) General Notes: Upon inspection patient's wound still did probe to bone on the medial aspect towards the patella region. Again we have continued to probe down to bone and/or hardware the entire time of seeing him. I do not feel like this is feeling inasmuch as I would have liked up to this point. We have been using endoform. With that being said I think it may be worthwhile for [place] to get him back to see his orthopedic surgeon I am not sure if his surgeon is still at [facility] clinic he could not tell me who it was that had performed the original surgery but this was not [facility] clinic orthopedics. Working to see about making that referral for him. Integumentary (Hair, Skin) Wound #2 status is Open. Original cause of wound was Gradually Appeared. The date acquired was: 01/16/2022. The wound has been in treatment 6 weeks. The wound is located on the Left Knee. The wound measures 1.7cm length x 1.2cm width x 1.5cm depth; 1.602cm^2 area and 2.403cm^3 volume. There is bone and Fat Layer (Subcutaneous Tissue) exposed. There is no tunneling noted, however, there is undermining starting at 11:00 and ending at 12:00 with a maximum distance of 0.6cm. There is a medium amount of serous drainage noted. There is large (67-100%) red granulation within the wound bed. There is no necrotic tissue within the wound bed. Assessment Active Problems ICD-10 Type 2 diabetes mellitus with foot ulcer Non-pressure chronic  ulcer of other part of left foot with fat layer exposed Non-pressure chronic ulcer of other  part of left lower leg with fat layer exposed Non-pressure chronic ulcer of other part of right lower leg with fat layer exposed Essential (primary) hypertension Chronic combined systolic (congestive) and diastolic (congestive) heart failure Chronic obstructive pulmonary disease, unspecified Paroxysmal atrial fibrillation Atherosclerotic heart disease of native coronary artery without angina pectoris Long term (current) use of anticoagulants Chronic kidney disease, stage 3 unspecified Corns and callosities Nicotine dependence, cigarettes, with other nicotine-induced disorders Plan Follow-up Appointments: Return Appointment in 2 weeks. Home Health: Home Health Company: - [facility] for wound care. May utilize formulary equivalent dressing for wound treatment orders unless otherwise specified. Home Health Nurse may visit PRN to address patient s wound care needs. Scheduled days for dressing changes to be completed; exception, patient has scheduled wound care visit that day. **Please direct any NON-WOUND related issues/requests for orders to patient's Primary Care Physician. **If current dressing causes regression in wound condition, may D/C ordered dressing product/s and apply Normal Saline Moist Dressing daily until next Wound Healing Center or Other MD appointment. **Notify Wound Healing Center of regression in wound condition at [phone]. Bathing/ Shower/ Hygiene: May shower with wound dressing protected with water repellent cover or cast protector. Edema Control - Lymphedema / Segmental Compressive Device / Other: Tubigrip double layer applied - Tubi D double layer B/L lower extremity Consults ordered were: Orthopedics - referral back to ortho provider at [facility] clinic WOUND #2: - Knee Wound Laterality: Left Cleanser: Soap and Water (Home Health) 3 x Per Week/30 Days Discharge Instructions: Gently cleanse leg and foot with antibacterial soap, rinse and pat dry  prior to dressing wounds Cleanser: Wound Cleanser (Home Health) 3 x Per Week/30 Days Discharge Instructions: Wash your hands with soap and water. Remove old dressing, discard into plastic bag and place into trash. Cleanse the wound with Wound Cleanser prior to applying a clean dressing using gauze sponges, not tissues or cotton balls. Do not scrub or use excessive force. Pat dry using gauze sponges, not tissue or cotton balls. Primary Dressing: Endoform Natural, Non-fenestrated, 2x2 (in/in) 3 x Per Week/30 Days Discharge Instructions: cut thin strip to pack into wound Secondary Dressing: (BORDER) Zetuvit Plus SILICONE BORDER Dressing 4x4 (in/in) (Home Health) 3 x Per Week/30 Days Discharge Instructions: Please do not put silicone bordered dressings under wraps. Use non-bordered dressing only. Secured With: Tubigrip Size D, 3x10 (in/yd) 3 x Per Week/30 Days Discharge Instructions: over knee for swelling double layer [name], [name] ([id number]) 1. I am going to recommend that we go ahead and make the referral back to [facility] clinic orthopedics which I think is good to be the right thing to do at this point. 2. I am also can recommend that we have the patient continue to utilize the endoform at this point we will try to do what we can get this to heal and but unfortunately still probes down to bone/hardware. We did do an MRI again it was a little inconclusive due to artifact from the hardware but nonetheless did not show any obvious signs of osteomyelitis which was good nonetheless I am not sure if he is going to need a triple phase bone scan or something of the sort. Again that something that I did discuss is a possibility but that would be something I would leave the orthopedics order. We will see patient back for reevaluation in 2 weeks here in the clinic. If  anything worsens or changes patient will contact our office for additional recommendations. Electronic Signature(s) Signed: 04/02/2022  4:16:29 PM By: Worthy Keeler PA-C Entered By: Worthy Keeler on 04/02/2022 16:16:29 [name], [name] ([id number]) -------------------------------------------------------------------------------- SuperBill Details Patient Name: [name]. Date of Service: 04/02/2022 Medical Record Number: [id number] Patient Account Number: [id number] Date of Birth/Sex: March 06, 1956 (66 y.o. M) Treating RN: Cornell Barman Primary Care Provider: Delight Stare Other Clinician: Massie Kluver Referring Provider: Delight Stare Treating Provider/Extender: Skipper Cliche in Treatment: 6 Diagnosis Coding ICD-10 Codes Code Description E11.621 Type 2 diabetes mellitus with foot ulcer L97.522 Non-pressure chronic ulcer of other part of left foot with fat layer exposed L97.822 Non-pressure chronic ulcer of other part of left lower leg with fat layer exposed L97.812 Non-pressure chronic ulcer of other part of right lower leg with fat layer exposed I10 Essential (primary) hypertension I50.42 Chronic combined systolic (congestive) and diastolic (congestive) heart failure J44.9 Chronic obstructive pulmonary disease, unspecified I48.0 Paroxysmal atrial fibrillation I25.10 Atherosclerotic heart disease of native coronary artery without angina pectoris 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.01 Long term (current) use of anticoagulants N18.30 Chronic kidney disease, stage 3 unspecified L84 Corns and callosities F17.218 Nicotine dependence, cigarettes, with other nicotine-induced disorders Facility Procedures CPT4 Code: [id number] Description: 99213 - WOUND CARE VISIT-LEV 3 EST PT Modifier: Quantity: 1 Physician Procedures CPT4 Code: [id number] Description: 99214 - WC PHYS LEVEL 4 - EST PT Modifier: Quantity: 1 CPT4 Code: Description: ICD-10 Diagnosis Description E11.621 Type 2 diabetes mellitus with foot ulcer 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

## 2022-04-15 ENCOUNTER — Encounter: Payer: Medicare HMO | Attending: Physician Assistant | Admitting: Physician Assistant

## 2022-04-15 DIAGNOSIS — I2510 Atherosclerotic heart disease of native coronary artery without angina pectoris: Secondary | ICD-10-CM | POA: Insufficient documentation

## 2022-04-15 DIAGNOSIS — N183 Chronic kidney disease, stage 3 unspecified: Secondary | ICD-10-CM | POA: Insufficient documentation

## 2022-04-15 DIAGNOSIS — L97822 Non-pressure chronic ulcer of other part of left lower leg with fat layer exposed: Secondary | ICD-10-CM | POA: Diagnosis not present

## 2022-04-15 DIAGNOSIS — F17218 Nicotine dependence, cigarettes, with other nicotine-induced disorders: Secondary | ICD-10-CM | POA: Insufficient documentation

## 2022-04-15 DIAGNOSIS — E11621 Type 2 diabetes mellitus with foot ulcer: Secondary | ICD-10-CM | POA: Insufficient documentation

## 2022-04-15 DIAGNOSIS — E1122 Type 2 diabetes mellitus with diabetic chronic kidney disease: Secondary | ICD-10-CM | POA: Diagnosis not present

## 2022-04-15 DIAGNOSIS — E1140 Type 2 diabetes mellitus with diabetic neuropathy, unspecified: Secondary | ICD-10-CM | POA: Diagnosis not present

## 2022-04-15 DIAGNOSIS — I480 Paroxysmal atrial fibrillation: Secondary | ICD-10-CM | POA: Diagnosis not present

## 2022-04-15 DIAGNOSIS — J449 Chronic obstructive pulmonary disease, unspecified: Secondary | ICD-10-CM | POA: Diagnosis not present

## 2022-04-15 DIAGNOSIS — L97812 Non-pressure chronic ulcer of other part of right lower leg with fat layer exposed: Secondary | ICD-10-CM | POA: Diagnosis not present

## 2022-04-15 DIAGNOSIS — I130 Hypertensive heart and chronic kidney disease with heart failure and stage 1 through stage 4 chronic kidney disease, or unspecified chronic kidney disease: Secondary | ICD-10-CM | POA: Diagnosis not present

## 2022-04-15 DIAGNOSIS — I5042 Chronic combined systolic (congestive) and diastolic (congestive) heart failure: Secondary | ICD-10-CM | POA: Diagnosis not present

## 2022-04-15 DIAGNOSIS — E1151 Type 2 diabetes mellitus with diabetic peripheral angiopathy without gangrene: Secondary | ICD-10-CM | POA: Diagnosis not present

## 2022-04-15 DIAGNOSIS — Z7901 Long term (current) use of anticoagulants: Secondary | ICD-10-CM | POA: Diagnosis not present

## 2022-04-15 DIAGNOSIS — L97522 Non-pressure chronic ulcer of other part of left foot with fat layer exposed: Secondary | ICD-10-CM | POA: Diagnosis not present

## 2022-04-15 DIAGNOSIS — L84 Corns and callosities: Secondary | ICD-10-CM | POA: Insufficient documentation

## 2022-04-15 NOTE — Progress Notes (Signed)
BLAIN, HUNSUCKER ([id number]) Visit Report for 04/15/2022 Chief Complaint Document Details Patient Name: [name], [name]. Date of Service: 04/15/2022 11:15 AM Medical Record Number: [id number] Patient Account Number: [id number] Date of Birth/Sex: 1956/05/30 (66 y.o. M) Treating RN: Huel Coventry Primary Care Provider: Darreld Mclean Other Clinician: Betha Loa Referring Provider: Darreld Mclean Treating Provider/Extender: Rowan Blase in Treatment: 8 Information Obtained from: Patient Chief Complaint Left LE and foot ulcers Electronic Signature(s) Signed: 04/15/2022 11:05:40 AM By: Lenda Kelp PA-C Entered By: Lenda Kelp on 04/15/2022 11:05:40 [name] ([id number]) -------------------------------------------------------------------------------- Physician Orders Details Patient Name: [name] Date of Service: 04/15/2022 11:15 AM Medical Record Number: [id number] Patient Account Number: [id number] Date of Birth/Sex: Jan 11, 1956 (66 y.o. 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 chronic ulcer of other part of right lower leg with fat layer exposed I10 Essential (primary) hypertension I50.42 Chronic combined systolic (congestive) and diastolic (congestive) heart failure J44.9 Chronic obstructive pulmonary disease, unspecified I48.0 Paroxysmal atrial fibrillation I25.10 Atherosclerotic heart disease of native coronary artery without angina pectoris Z79.01 Long term (current) use of  anticoagulants N18.30 Chronic kidney disease, stage 3 unspecified L84 Corns and callosities F17.218 Nicotine dependence, cigarettes, with other nicotine-induced disorders Follow-up Appointments o Return Appointment in 2 weeks. Home Health o Home [facility]: [facility] o [facility] for wound care. May utilize formulary equivalent dressing for wound treatment orders unless otherwise specified. Home Health Nurse may visit PRN to address patientos wound care needs. o Scheduled days for dressing changes to be completed; exception, patient has scheduled wound care visit that day. o **Please direct any NON-WOUND related issues/requests for orders to patient's Primary Care Physician. **If current dressing causes regression in wound condition, may D/C ordered dressing product/s and apply Normal Saline Moist Dressing daily until next Wound Healing Center or Other MD appointment. **Notify Wound Healing Center of regression in wound condition at [phone]. Bathing/ Shower/ Hygiene o May shower with wound dressing protected with water repellent cover or cast protector. Edema Control - Lymphedema / Segmental Compressive Device / Other o Tubigrip double layer applied - Tubi D double layer B/L lower extremity Wound Treatment Wound #2 - Knee Wound Laterality: Left Cleanser: Soap and Water (Home Health) 3 x Per Week/30 Days Discharge Instructions: Gently cleanse leg and foot with antibacterial soap, rinse and pat dry prior to dressing wounds Cleanser: Wound Cleanser (Home Health) 3 x Per Week/30 Days Discharge Instructions: Wash your hands with soap and water. Remove old dressing, discard into plastic bag and place into trash. Cleanse the wound with Wound Cleanser prior to applying a clean dressing using gauze sponges, not tissues or cotton balls. Do not scrub or use excessive force. Pat dry using gauze sponges, not tissue or cotton balls. Secondary Dressing: (BORDER) Zetuvit  Plus SILICONE BORDER Dressing 4x4 (in/in) (Home Health) 3 x Per Week/30 Days Discharge Instructions: Please do not put silicone bordered dressings under wraps. Use non-bordered dressing only. Secured With: Tubigrip Size D, 3x10 (in/yd) 3 x Per Week/30 Days Discharge Instructions: over knee for swelling double layer [name], [name] ([id number]) Wound #6 -  Lower Leg Wound Laterality: Right, Anterior Cleanser: Soap and Water (Home Health) 3 x Per Week/30 Days Discharge Instructions: Gently cleanse leg and foot with antibacterial soap, rinse and pat dry prior to dressing wounds Cleanser: Wound Cleanser (Home Health) 3 x Per Week/30 Days Discharge Instructions: Wash your hands with soap and water. Remove old dressing, discard into plastic bag and place into trash. Cleanse the wound with Wound Cleanser prior to applying a clean dressing using gauze sponges, not tissues or cotton balls. Do not scrub or use excessive force. Pat dry using gauze sponges, not tissue or cotton balls. Primary Dressing: Xeroform 4x4-HBD (in/in) 3 x Per Week/30 Days Discharge Instructions: Apply Xeroform 4x4-HBD (in/in) as directed Secondary Dressing: (BORDER) Zetuvit Plus SILICONE BORDER Dressing 4x4 (in/in) (Home Health) 3 x Per Week/30 Days Discharge Instructions: Please do not put silicone bordered dressings under wraps. Use non-bordered dressing only. Secured With: Tubigrip Size D, 3x10 (in/yd) 3 x Per Week/30 Days Discharge Instructions: over knee for swelling double layer Electronic Signature(s) Unsigned Entered By: Betha Loa on 04/15/2022 11:55:18 Signature(s): Date(s): [name] ([id number]) -------------------------------------------------------------------------------- Problem List Details Patient Name: [name], [name] Date of Service: 04/15/2022 11:15 AM Medical Record Number: [id number] Patient Account Number: [id number] Date of Birth/Sex: 22-May-1956 (66 y.o. 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/04/2022 No Yes exposed I10 Essential (primary) hypertension 02/15/2022 No Yes I50.42 Chronic combined systolic (congestive) and diastolic (congestive) heart 02/15/2022 No Yes failure J44.9 Chronic obstructive pulmonary disease, unspecified 02/15/2022 No Yes I48.0 Paroxysmal atrial fibrillation 02/15/2022 No Yes I25.10 Atherosclerotic heart disease of native coronary artery without angina 02/15/2022 No Yes pectoris Z79.01 Long term (current) use of anticoagulants 02/15/2022 No Yes N18.30 Chronic kidney disease, stage 3 unspecified 02/15/2022 No Yes L84 Corns and callosities 02/15/2022 No Yes [name], [name] ([id number]) F17.218 Nicotine dependence, cigarettes, with other nicotine-induced disorders 03/04/2022 No Yes Inactive Problems Resolved Problems Electronic Signature(s) Signed: 04/15/2022 11:05:32 AM By: Lenda Kelp PA-C Entered By: Lenda Kelp on 04/15/2022 11:05:32

## 2022-04-15 NOTE — Progress Notes (Addendum)
MIRON, MARXEN ([id number]) [id number] Page 1 of 10 Visit Report for 04/15/2022 Arrival Information Details Patient Name: Date of Service: [name], [name]. 04/15/2022 11:15 A M Medical Record Number: [id number] Patient Account Number: [id number] Date of Birth/Sex: Treating RN: 02-Feb-1956 (66 y.o. Loel Lofty, Selena Batten Primary Care Jenna Ardoin: Darreld Mclean Other Clinician: Betha Loa Referring Anvitha Hutmacher: Treating Krystel Fletchall/Extender: Salvatore Decent in Treatment: 8 Visit Information History Since Last Visit All ordered tests and consults were completed: No Patient Arrived: Ambulatory Added or deleted any medications: No Arrival Time: 11:19 Any new allergies or adverse reactions: No Transfer Assistance: None Had a fall or experienced change in No Patient Requires Transmission-Based Precautions: No activities of daily living that may affect Patient Has Alerts: Yes risk of falls: Patient Alerts: Patient on Blood Thinner Hospitalized since last visit: No eliquis Pain Present Now: No Type II Diabetic Electronic Signature(s) Signed: 04/17/2022 5:04:43 PM By: Betha Loa Entered By: Betha Loa on 04/15/2022 11:24:15 -------------------------------------------------------------------------------- Clinic Level of Care Assessment Details Patient Name: Date of Service: [place] Diaz, [name]. 04/15/2022 11:15 A M Medical Record Number: [id number] Patient Account Number: [id number] Date of Birth/Sex: Treating RN: 06/08/1956 (66 y.o. Arthur Holms Primary Care Latima Hamza: Darreld Mclean Other Clinician: Betha Loa Referring Zaidee Rion: Treating Lashundra Shiveley/Extender: Salvatore Decent in Treatment: 8 Clinic Level of Care Assessment Items TOOL 4 Quantity Score []  - 0 Use when only an EandM is performed on FOLLOW-UP visit ASSESSMENTS - Nursing Assessment / Reassessment X- 1 10 Reassessment of Co-morbidities (includes updates in  patient status) X- 1 5 Reassessment of Adherence to Treatment Plan ASSESSMENTS - Wound and Skin A ssessment / Reassessment []  - 0 Simple Wound Assessment / Reassessment - one wound X- 2 5 Complex Wound Assessment / Reassessment - multiple wounds [name], [name] ( ) [id number] Page 2 of 10 []  - 0 Dermatologic / Skin Assessment (not related to wound area) ASSESSMENTS - Focused Assessment []  - 0 Circumferential Edema Measurements - multi extremities []  - 0 Nutritional Assessment / Counseling / Intervention []  - 0 Lower Extremity Assessment (monofilament, tuning fork, pulses) []  - 0 Peripheral Arterial Disease Assessment (using hand held doppler) ASSESSMENTS - Ostomy and/or Continence Assessment and Care []  - 0 Incontinence Assessment and Management []  - 0 Ostomy Care Assessment and Management (repouching, etc.) PROCESS - Coordination of Care X - Simple Patient / Family Education for ongoing care 1 15 []  - 0 Complex (extensive) Patient / Family Education for ongoing care X- 1 10 Staff obtains [id number], Records, T Results / Process Orders est []  - 0 Staff telephones HHA, Nursing Homes / Clarify orders / etc []  - 0 Routine Transfer to another Facility (non-emergent condition) []  - 0 Routine Hospital Admission (non-emergent condition) []  - 0 New Admissions / 02-22-1986 / Ordering NPWT Apligraf, etc. , []  - 0 Emergency Hospital Admission (emergent condition) X- 1 10 Simple Discharge Coordination []  - 0 Complex (extensive) Discharge Coordination PROCESS - Special Needs []  - 0 Pediatric / Minor Patient Management []  - 0 Isolation Patient Management []  - 0 Hearing / Language / Visual special needs []  - 0 Assessment of Community assistance (transportation, D/C planning, etc.) []  - 0 Additional assistance / Altered mentation []  - 0 Support Surface(s) Assessment (bed, cushion, seat, etc.) INTERVENTIONS - Wound Cleansing  / Measurement []  - 0 Simple Wound Cleansing - one wound X- 2 5 Complex Wound Cleansing - multiple wounds X- 1 5 Wound Imaging (photographs - any number  of wounds) []  - 0 Wound Tracing (instead of photographs) []  - 0 Simple Wound Measurement - one wound X- 2 5 Complex Wound Measurement - multiple wounds INTERVENTIONS - Wound Dressings []  - 0 Small Wound Dressing one or multiple wounds X- 2 15 Medium Wound Dressing one or multiple wounds []  - 0 Large Wound Dressing one or multiple wounds []  - 0 Application of Medications - topical []  - 0 Application of Medications - injection INTERVENTIONS - Miscellaneous []  - 0 External ear exam []  - 0 Specimen Collection (cultures, biopsies, blood, body fluids, etc.) []  - 0 Specimen(s) / Culture(s) sent or taken to Lab for analysis [name], [name] ([id number]) [id number] Page 3 of 10 []  - 0 Patient Transfer (multiple staff / Civil Service fast streamer / Similar devices) []  - 0 Simple Staple / Suture removal (25 or less) []  - 0 Complex Staple / Suture removal (26 or more) []  - 0 Hypo / Hyperglycemic Management (close monitor of Blood Glucose) []  - 0 Ankle / Brachial Index (ABI) - do not check if billed separately X- 1 5 Vital Signs Has the patient been seen at the hospital within the last three years: Yes Total Score: 120 Level Of Care: New/Established - Level 4 Electronic Signature(s) Signed: 04/17/2022 5:04:43 PM By: Massie Kluver Entered By: Massie Kluver on 04/15/2022 11:56:05 -------------------------------------------------------------------------------- Encounter Discharge Information Details Patient Name: Date of Service: [name], Jeffrey [name]. 04/15/2022 11:15 A M Medical Record Number: [id number] Patient Account Number: [id number] Date of Birth/Sex: Treating RN: [birth year] (66 y.o. [name] Primary Care Chena Chohan: Delight Stare Other Clinician: Massie Kluver Referring Trinidi Toppins: Treating  Persephonie Hegwood/Extender: Liam Rogers in Treatment: 8 Encounter Discharge Information Items Discharge Condition: Stable Ambulatory Status: Cane Discharge Destination: Home Transportation: Private Auto Accompanied By: friend Schedule Follow-up Appointment: Yes Clinical Summary of Care: Electronic Signature(s) Signed: 04/17/2022 5:04:43 PM By: Massie Kluver Entered By: Massie Kluver on 04/15/2022 12:07:10 -------------------------------------------------------------------------------- Lower Extremity Assessment Details Patient Name: Date of Service: [name], Jeffrey [name]. 04/15/2022 11:15 A M Medical Record Number: [id number] Patient Account Number: [id number] Date of Birth/Sex: Treating RN: [birth year] (66 y.o. [name] Primary Care Layton Tappan: Delight Stare Other Clinician: Massie Kluver Referring Rosena Bartle: Treating Skylar Priest/Extender: Akira, Utsler ([id number]) [id number] Page 4 of 10 Weeks in Treatment: 8 Edema Assessment Assessed: [Left: Yes] [Right: Yes] Edema: [Left: Yes] [Right: Yes] Calf Left: Right: Point of Measurement: 30 cm From Medial Instep 41.4 cm 45.7 cm Ankle Left: Right: Point of Measurement: 10 cm From Medial Instep 27.2 cm 26.7 cm Vascular Assessment Pulses: Dorsalis Pedis Palpable: [Left:Yes] [Right:Yes] Electronic Signature(s) Signed: 04/15/2022 5:08:52 PM By: Gretta Cool, BSN, RN, CWS, Kim RN, BSN Signed: 04/17/2022 5:04:43 PM By: Massie Kluver Entered By: Massie Kluver on 04/15/2022 11:46:10 -------------------------------------------------------------------------------- Multi Wound Chart Details Patient Name: Date of Service: [name], Jeffrey [name]. 04/15/2022 11:15 A M Medical Record Number: [id number] Patient Account Number: [id number] Date of Birth/Sex: Treating RN: Aug 20, 1955 (66 y.o. [name] Primary Care Charliene Inoue: Delight Stare Other Clinician: Massie Kluver Referring  Aydan Levitz: Treating Mylah Baynes/Extender: Liam Rogers in Treatment: 8 Vital Signs Height(in): 72 Pulse(bpm): 17 Weight(lbs): Blood Pressure(mmHg): 153/79 Body Mass Index(BMI): Temperature(F): 97.6 Respiratory Rate(breaths/min): 18 [2:Photos:] [N/A:N/A] Left Knee Right, Anterior Lower Leg N/A Wound Location: Gradually Appeared Trauma N/A Wounding Event: Diabetic Wound/Ulcer of the Lower Skin Tear N/A Primary Etiology: Extremity Infection - not elsewhere classified N/A N/A Secondary Etiology: Chronic Obstructive Pulmonary Chronic Obstructive Pulmonary  N/A Comorbid HistoryABDIRIZAK, RISENHOOVER ([id number]) [id number] Page 5 of 10 Disease (COPD), Arrhythmia, Disease (COPD), Arrhythmia, Hypertension, Peripheral Venous Hypertension, Peripheral Venous Disease, Type II Diabetes, Disease, Type II Diabetes, Neuropathy Neuropathy 01/16/2022 04/13/2022 N/A Date Acquired: 8 0 N/A Weeks of Treatment: Open Open N/A Wound Status: No No N/A Wound Recurrence: 0.1x0.1x1.1 1x0.7x0.1 N/A Measurements Diaz x W x D (cm) 0.008 0.55 N/A A (cm) : rea 0.009 0.055 N/A Volume (cm) : 91.50% N/A N/A % Reduction in Area: 96.20% N/A N/A % Reduction in Volume: Grade 2 Full Thickness Without Exposed N/A Classification: Support Structures Medium Medium N/A Exudate A mount: Purulent Serosanguineous N/A Exudate Type: yellow, brown, green red, brown N/A Exudate Color: N/A Flat and Intact N/A Wound Margin: Large (67-100%) None Present (0%) N/A Granulation Amount: Red N/A N/A Granulation Quality: None Present (0%) Small (1-33%) N/A Necrotic Amount: N/A Eschar N/A Necrotic Tissue: Fat Layer (Subcutaneous Tissue): Yes Fat Layer (Subcutaneous Tissue): Yes N/A Exposed Structures: Bone: Yes Fascia: No Fascia: No Tendon: No Tendon: No Muscle: No Muscle: No Joint: No Joint: No Bone: No None None N/A Epithelialization: Treatment Notes Electronic  Signature(s) Signed: 04/17/2022 5:04:43 PM By: Massie Kluver Entered By: Massie Kluver on 04/15/2022 11:50:59 -------------------------------------------------------------------------------- Multi-Disciplinary Care Plan Details Patient Name: Date of Service: [name], Jeffrey [name]. 04/15/2022 11:15 A M Medical Record Number: [id number] Patient Account Number: [id number] Date of Birth/Sex: Treating RN: 22-May-1956 (66 y.o. [name] Primary Care Tyese Finken: Delight Stare Other Clinician: Massie Kluver Referring Clifton Kovacic: Treating Rosaelena Kemnitz/Extender: Liam Rogers in Treatment: 8 Active Inactive Electronic Signature(s) Signed: 07/03/2022 3:00:01 PM By: Gretta Cool, BSN, RN, CWS, Kim RN, BSN Previous Signature: 04/15/2022 5:08:52 PM Version By: Gretta Cool BSN, RN, CWS, Kim RN, BSN Previous Signature: 04/17/2022 5:04:43 PM Version By: Massie Kluver Entered By: Gretta Cool BSN, RN, CWS, Kim on 07/03/2022 15:00:01 [name], [name] ([id number]) [id number] Page 6 of 10 -------------------------------------------------------------------------------- Pain Assessment Details Patient Name: Date of Service: [place] Diaz, [place] [name]. 04/15/2022 11:15 A M Medical Record Number: [id number] Patient Account Number: [id number] Date of Birth/Sex: Treating RN: 1956-02-06 (66 y.o. [name] Primary Care Susannah Carbin: Delight Stare Other Clinician: Massie Kluver Referring Azelia Reiger: Treating Eliora Nienhuis/Extender: Liam Rogers in Treatment: 8 Active Problems Location of Pain Severity and Description of Pain Patient Has Paino No Site Locations Pain Management and Medication Current Pain Management: Electronic Signature(s) Signed: 04/15/2022 5:08:52 PM By: Gretta Cool, BSN, RN, CWS, Kim RN, BSN Signed: 04/17/2022 5:04:43 PM By: Massie Kluver Entered By: Massie Kluver on 04/15/2022  11:26:49 -------------------------------------------------------------------------------- Patient/Caregiver Education Details Patient Name: Date of Service: [name], Jeffrey [name]. 9/11/2023andnbsp11:15 [place] Record Number: [id number] Patient Account Number: [id number] Date of Birth/Gender: Treating RN: [birth year] (65 y.o. [name] Primary Care Physician: Delight Stare Other Clinician: Massie Kluver Referring Physician: Treating Physician/Extender: Liam Rogers in Treatment: 8 [name], [name] ([id number]) [id number] Page 7 of 10 Education Assessment Education Provided To: Patient Education Topics Provided Wound/Skin Impairment: Handouts: Other: continue wound care as directed Methods: Explain/Verbal Responses: State content correctly Electronic Signature(s) Signed: 04/17/2022 5:04:43 PM By: Massie Kluver Entered By: Massie Kluver on 04/15/2022 12:06:38 -------------------------------------------------------------------------------- Wound Assessment Details Patient Name: Date of Service: [name], Jeffrey [name]. 04/15/2022 11:15 A M Medical Record Number: [id number] Patient Account Number: [id number] Date of Birth/Sex: Treating RN: May 19, 1956 (66 y.o. [name] Primary Care Asuzena Weis: Delight Stare Other Clinician: Massie Kluver Referring Theodora Lalanne: Treating Anedra Penafiel/Extender: Landis Martins,  Rochele Raring in Treatment: 8 Wound Status Wound Number: 2 Primary Diabetic Wound/Ulcer of the Lower Extremity Etiology: Wound Location: Left Knee Secondary Infection - not elsewhere classified Wounding Event: Gradually Appeared Etiology: Date Acquired: 01/16/2022 Wound Healed - Epithelialized Weeks Of Treatment: 8 Status: Clustered Wound: No Comorbid Chronic Obstructive Pulmonary Disease (COPD), Arrhythmia, History: Hypertension, Peripheral Venous Disease, Type II Diabetes, Neuropathy Photos Wound Measurements Length:  (cm) Width: (cm) Depth: (cm) Area: (cm) Volume: (cm) 0 % Reduction in Area: 100% 0 % Reduction in Volume: 100% 0 Epithelialization: None 0 Tunneling: No 0 Undermining: No Wound Description Classification: Grade 2 Nierenberg, [name] ([id number]) Exudate Amount: Medium Exudate Type: Purulent Exudate Color: yellow, brown, green Foul Odor After Cleansing: No [id number] Page 8 of 10 Slough/Fibrino Yes Wound Bed Granulation Amount: Large (67-100%) Exposed Structure Granulation Quality: Red Fascia Exposed: No Necrotic Amount: None Present (0%) Fat Layer (Subcutaneous Tissue) Exposed: Yes Tendon Exposed: No Muscle Exposed: No Joint Exposed: No Bone Exposed: Yes Treatment Notes Wound #2 (Knee) Wound Laterality: Left Cleanser Soap and Water Discharge Instruction: Gently cleanse leg and foot with antibacterial soap, rinse and pat dry prior to dressing wounds Wound Cleanser Discharge Instruction: Wash your hands with soap and water. Remove old dressing, discard into plastic bag and place into trash. Cleanse the wound with Wound Cleanser prior to applying a clean dressing using gauze sponges, not tissues or cotton balls. Do not scrub or use excessive force. Pat dry using gauze sponges, not tissue or cotton balls. Peri-Wound Care Topical Primary Dressing Secondary Dressing (BORDER) Zetuvit Plus SILICONE BORDER Dressing 4x4 (in/in) Discharge Instruction: Please do not put silicone bordered dressings under wraps. Use non-bordered dressing only. Secured With Tubigrip Size D, 3x10 (in/yd) Discharge Instruction: over knee for swelling double layer Compression Wrap Compression Stockings Add-Ons Electronic Signature(s) Signed: 07/03/2022 3:00:46 PM By: Elliot Gurney, BSN, RN, CWS, Kim RN, BSN Previous Signature: 04/15/2022 5:08:52 PM Version By: Elliot Gurney, BSN, RN, CWS, Kim RN, BSN Previous Signature: 04/17/2022 5:04:43 PM Version By: Betha Loa Entered By: Elliot Gurney  BSN, RN, CWS, Kim on 07/03/2022 15:00:46 -------------------------------------------------------------------------------- Wound Assessment Details Patient Name: Date of Service: [name], Jeffrey [name]. 04/15/2022 11:15 A M Medical Record Number: [id number] Patient Account Number: [id number] Date of Birth/Sex: Treating RN: 10/14/1955 (65 y.o. Arthur Holms Primary Care Christyann Manolis: Darreld Mclean Other Clinician: Betha Loa Referring Juanpablo Ciresi: Treating Clyde Zarrella/Extender: Salvatore Decent in Treatment: 8 Wound Status Wound Number: 6 Primary Skin Tear Etiology: Wound Location: Right, Anterior Lower Leg [name], [name] ([id number]) [id number] Page 9 of 10 Wound Open Wounding Event: Trauma Status: Date Acquired: 04/13/2022 Comorbid Chronic Obstructive Pulmonary Disease (COPD), Arrhythmia, Weeks Of Treatment: 0 History: Hypertension, Peripheral Venous Disease, Type II Diabetes, Clustered Wound: No Neuropathy Photos Wound Measurements Length: (cm) 1 Width: (cm) 0.7 Depth: (cm) 0.1 Area: (cm) 0.55 Volume: (cm) 0.055 % Reduction in Area: % Reduction in Volume: Epithelialization: None Tunneling: No Undermining: No Wound Description Classification: Full Thickness Without Exposed Support Wound Margin: Flat and Intact Exudate Amount: Medium Exudate Type: Serosanguineous Exudate Color: red, brown Structures Foul Odor After Cleansing: No Slough/Fibrino No Wound Bed Granulation Amount: None Present (0%) Exposed Structure Necrotic Amount: Small (1-33%) Fascia Exposed: No Necrotic Quality: Eschar Fat Layer (Subcutaneous Tissue) Exposed: Yes Tendon Exposed: No Muscle Exposed: No Joint Exposed: No Bone Exposed: No Electronic Signature(s) Signed: 04/15/2022 5:08:52 PM By: Elliot Gurney, BSN, RN, CWS, Kim RN, BSN Signed: 04/17/2022 5:04:43 PM By: Betha Loa Entered By: Betha Loa on 04/15/2022  11:44:18 --------------------------------------------------------------------------------  Vitals Details Patient Name: Date of Service: [place], [place] [name]. 04/15/2022 11:15 A M Medical Record Number: [id number] Patient Account Number: [id number] Date of Birth/Sex: Treating RN: October 15, 1955 (66 y.o. [name] Primary Care Argus Caraher: Delight Stare Other Clinician: Massie Kluver Referring Sharmarke Cicio: Treating Jessamy Torosyan/Extender: Liam Rogers in Treatment: 8 Vital Signs Time Taken: 11:26 Temperature (F): 97.6 [name], [name] ([id number]) [id number] Page 10 of 10 Height (in): 72 Pulse (bpm): 61 Respiratory Rate (breaths/min): 18 Blood Pressure (mmHg): 153/79 Reference Range: 80 - 120 mg / dl Electronic Signature(s) Signed: 04/17/2022 5:04:43 PM By: Massie Kluver Entered By: Massie Kluver on 04/15/2022 11:26:43

## 2022-04-29 ENCOUNTER — Ambulatory Visit: Payer: Medicare HMO | Admitting: Physician Assistant

## 2022-05-06 ENCOUNTER — Ambulatory Visit: Payer: Medicare HMO | Admitting: Physician Assistant

## 2022-05-13 ENCOUNTER — Ambulatory Visit: Payer: Medicare HMO | Admitting: Physician Assistant

## 2022-06-05 ENCOUNTER — Encounter: Payer: Self-pay | Admitting: *Deleted

## 2022-06-10 NOTE — Progress Notes (Deleted)
Cardiology Office Note  Date:  06/10/2022   ID:  [name], [name] 11-05-1955, MRN [id number]  PCP:  Marguerita Merles, MD   No chief complaint on file.   HPI:  [name] is a 66 year old gentleman with history of  smoking since the age of 55 who continues to smoke, COPD diabetes type 2,  hyperlipidemia,  secondary erythrocytosis, managed by outpatient hematology with periodic phlebotomy with admission to the hospital 10/21/2011 with discharge March 20 for ischemic toe.  cardiomyopathy, ejection fraction 35-40%. Catheterization in 2015 previously showed distal LAD and RCA disease, medical management recommended He presents today for follow-up of his coronary artery disease.  Seen by myself in clinic December 2020 Seen by one of our providers May 2023 Seen by EP August 2023 Was seen for AV conduction disease     In follow-up today reports that he is doing relatively well Chronic shortness of breath Continues to smoke 1 pack/day  Lab work reviewed with him in detail HAB1C 10.2, poor diet Total chol 116, LDL 64  No chest pain/angina No regular exercise Difficulty losing weight  Records requested from primary care, notes reviewed, lab work as above  Has never seen pulmonary before for his shortness of breath   neuropathy  better on Lyrica Could not afford it  EKG personally reviewed by myself on todays visit Shows normal sinus rhythm left bundle branch block rate 56 bpm  Other past medical history reviewed  stress test 03/04/2016 No ischemia, small region of moderate intensity fixed defect apical region felt secondary to attenuation artifact Low risk scan   Echocardiogram was done in the hospital to rule out embolic source. It was felt he had small plaque with emboli causing his ischemic toe. He was treated with anticoagulation in the hospital with improvement of his symptoms. Unable to exclude erythrocytosis as a cause of his symptoms and he did have phlebotomy  of one unit while in the hospital. Echocardiogram read by outside physician detailed ejection fraction 35-40% mild MR and TR No other cardiac workup  done in the hospital     PMH:   has a past medical history of CAD (coronary artery disease) (01/07/2014), Cellulitis (12/17/2015), Cellulitis and abscess of foot (01/17/2015), Cellulitis and abscess of foot excluding toe (01/17/2015), Cellulitis of left leg (12/17/2015), Chest pain (11/16/2013), Chronic combined systolic and diastolic CHF (congestive heart failure) ([place]) (01/09/3709), Chronic systolic CHF (congestive heart failure) ([place]), Chronic, continuous use of opioids (08/26/2013), Circulatory system disorder (11/16/2013), CKD (chronic kidney disease), stage III ([place]), Collagen vascular disease ([place]), Congestive dilated cardiomyopathy ([place]) (11/16/2013), COPD (chronic obstructive pulmonary disease) ([place]), Coronary artery disease, non-occlusive, Coronary atherosclerosis (01/07/2014), Diabetes mellitus, type 2 ([place]) (11/16/2013), Diabetic infection of left foot ([place]) (02/03/2015), Endomyocardial disease ([place]) (11/16/2013), Essential hypertension (01/17/2015), Familial multiple lipoprotein-type hyperlipidemia (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), Hereditary and idiopathic peripheral neuropathy (01/17/2015), HTN (hypertension) (01/17/2015), Hyperlipidemia, Ischemic cardiomyopathy, Ischemic toe, Left bundle branch block (11/16/2013), Morbid obesity ([place]) (11/16/2013), Nondependent opioid abuse, continuous ([place]) (08/26/2013), Other chronic pain (08/26/2013), Peripheral neuropathy, Secondary erythrocytosis, Smoker (11/16/2013), SOB (shortness of breath) (11/16/2013), Tobacco abuse, Type 2 diabetes mellitus with diabetic neuropathy ([place]) (08/26/2013), and Type 2 diabetes mellitus without complications ([place]) (01/29/9484).  PSH:    Past Surgical History:  Procedure Laterality Date   ACHILLES TENDON SURGERY Left 03/08/2016   Procedure: ACHILLES LENGTHENING/KIDNER;  Surgeon: Samara Deist, DPM;  Location: [facility];   Service: Podiatry;  Laterality: Left;   CARDIOVERSION N/A 01/10/2022   Procedure: CARDIOVERSION;  Surgeon: Minna Merritts, MD;  Location: [facility];  Service: Cardiovascular;  Laterality: N/A;   I & D EXTREMITY Left 01/18/2015   Procedure: IRRIGATION AND DEBRIDEMENT EXTREMITY;  Surgeon: Gwyneth Revels, DPM;  Location: [facility];  Service: Podiatry;  Laterality: Left;   ORIF TIBIA FRACTURE Left 2008   SKIN DEBRIDEMENT  03/08/2016   Procedure: DEBRIDEMENT SKIN FULL THICKNESS;  Surgeon: Gwyneth Revels, DPM;  Location: [facility];  Service: Podiatry;;   WOUND EXPLORATION Left 03/10/2015   Procedure: WOUND EXPLORATION/ SECONDARY CLOSURE OF WOUND,COMPLICATED;  Surgeon: Gwyneth Revels, DPM;  Location: [facility];  Service: Podiatry;  Laterality: Left;    Current Outpatient Medications  Medication Sig Dispense Refill   albuterol (PROVENTIL HFA;VENTOLIN HFA) 108 (90 BASE) MCG/ACT inhaler Inhale 2 puffs into the lungs every 4 (four) hours as needed for wheezing or shortness of breath.     apixaban (ELIQUIS) 5 MG TABS tablet Take 1 tablet (5 mg total) by mouth 2 (two) times daily. 60 tablet 11   citalopram (CELEXA) 10 MG tablet Take 10 mg by mouth daily.     furosemide (LASIX) 20 MG tablet Take 1 tablet (20 mg total) by mouth daily. 90 tablet 3   glipiZIDE (GLUCOTROL XL) 10 MG 24 hr tablet Take 10 mg by mouth daily.  3   isosorbide mononitrate (IMDUR) 30 MG 24 hr tablet Take 0.5 tablets (15 mg total) by mouth daily. 45 tablet 3   linagliptin (TRADJENTA) 5 MG TABS tablet Take 5 mg by mouth every morning.      lisinopril (ZESTRIL) 5 MG tablet Take 1 tablet (5 mg total) by mouth daily. 90 tablet 3   loratadine (CLARITIN) 10 MG tablet Take 10 mg by mouth daily as needed for allergies (seasonal allergies in the spring).      lovastatin (MEVACOR) 40 MG tablet Take 40 mg by mouth at bedtime.     metoprolol succinate (TOPROL XL) 25 MG 24 hr tablet Take 1 tablet (25 mg total) by mouth daily. 90 tablet 3   nitroGLYCERIN  (NITROSTAT) 0.4 MG SL tablet Place 1 tablet (0.4 mg total) under the tongue every 5 (five) minutes as needed for chest pain. 25 tablet 3   pregabalin (LYRICA) 100 MG capsule Take 100 mg by mouth 3 (three) times daily.     sertraline (ZOLOFT) 100 MG tablet Take 100 mg by mouth at bedtime.     traZODone (DESYREL) 150 MG tablet TAKE 1 TABLET BY MOUTH AT BEDTIME. 30 tablet 0   No current facility-administered medications for this visit.     Allergies:   Metformin   Social History:  The patient  reports that he has been smoking cigarettes and e-cigarettes. He has a 11.50 pack-year smoking history. He has never used smokeless tobacco. He reports current alcohol use. He reports that he does not use drugs.   Family History:   family history includes Diabetes in his brother; Hyperlipidemia in his brother, brother, brother, and mother; Hypertension in his brother, brother, brother, maternal aunt, and mother.    Review of Systems: Review of Systems  Constitutional: Negative.   HENT: Negative.    Respiratory:  Positive for shortness of breath.   Cardiovascular: Negative.   Gastrointestinal: Negative.   Musculoskeletal: Negative.   Neurological: Negative.   Psychiatric/Behavioral: Negative.    All other systems reviewed and are negative.   PHYSICAL EXAM: VS:  There were no vitals taken for this visit. , BMI There is no height or weight on file to calculate BMI.  GEN: Well nourished,  well developed, in no acute distress , obese HEENT: normal  Neck: no JVD, carotid bruits, or masses Cardiac: RRR; no murmurs, rubs, or gallops,no edema  Respiratory:  clear to auscultation bilaterally, normal work of breathing GI: soft, nontender, nondistended, + BS MS: no deformity or atrophy  Skin: warm and dry, no rash Neuro:  Strength and sensation are intact Psych: euthymic mood, full affect   Recent Labs: 12/06/2021: TSH 3.603 01/31/2022: ALT 13 02/01/2022: Hemoglobin 14.0; Magnesium 1.9; Platelets  91 02/02/2022: BUN 14; Potassium 3.9; Sodium 135 02/03/2022: Creatinine, Ser 1.31    Lipid Panel No results found for: "CHOL", "HDL", "LDLCALC", "TRIG"    Wt Readings from Last 3 Encounters:  03/13/22 (!) 317 lb (143.8 kg)  01/31/22 (!) 328 lb (148.8 kg)  01/10/22 (!) 328 lb (148.8 kg)       ASSESSMENT AND PLAN:  Coronary artery disease of native artery of native heart with stable angina pectoris ([name]) - Plan: EKG 12-Lead Currently with no symptoms of angina. No further workup at this time. Continue current medication regimen. Stressed importance of better diabetes control  Mixed hyperlipidemia Numbers at goal, reviewed with him no changes made to medications  Congestive dilated cardiomyopathy ([name]) - Plan: EKG 12-Lead No medication changes made Discussed repeating imaging studies  Shortness of breath Likely multifactorial, he is declining referral to pulmonary  Chronic combined systolic and diastolic CHF (congestive heart failure) ([name]) -  Not on a diuretic, in the past also not on a diuretic Appears relatively euvolemic Weight is actually trending downward likes to dietary changes  Morbid obesity ([name]) Stressed importance of continued weight loss as he is doing  Type 2 diabetes mellitus with other circulatory complication, with long-term current use of insulin ([name]) Impressive weight loss over the past year should help numbers  Smoker Long discussion, recommended he stop smoking and nicotine inhalers Discussed various strategies for smoking cessation  CKD (chronic kidney disease), stage III ([name]) Followed by primary care  Disposition:   F/U  12 months   Total encounter time more than 25 minutes  Greater than 50% was spent in counseling and coordination of care with the patient    No orders of the defined types were placed in this encounter.    Signed, Dossie Arbour, M.D., Ph.D. 06/10/2022  [facility] [place], [place] [phone]

## 2022-06-11 ENCOUNTER — Ambulatory Visit: Payer: Medicare Other | Attending: Cardiovascular Disease | Admitting: Cardiovascular Disease

## 2022-06-11 DIAGNOSIS — I5020 Unspecified systolic (congestive) heart failure: Secondary | ICD-10-CM

## 2022-06-11 DIAGNOSIS — Z720 Tobacco use: Secondary | ICD-10-CM

## 2022-06-11 DIAGNOSIS — Z789 Other specified health status: Secondary | ICD-10-CM

## 2022-06-11 DIAGNOSIS — I447 Left bundle-branch block, unspecified: Secondary | ICD-10-CM

## 2022-06-11 DIAGNOSIS — E785 Hyperlipidemia, unspecified: Secondary | ICD-10-CM

## 2022-06-11 DIAGNOSIS — I4819 Other persistent atrial fibrillation: Secondary | ICD-10-CM

## 2022-06-11 DIAGNOSIS — I2510 Atherosclerotic heart disease of native coronary artery without angina pectoris: Secondary | ICD-10-CM

## 2022-06-11 DIAGNOSIS — I10 Essential (primary) hypertension: Secondary | ICD-10-CM

## 2022-06-11 DIAGNOSIS — N183 Chronic kidney disease, stage 3 unspecified: Secondary | ICD-10-CM

## 2022-06-12 ENCOUNTER — Encounter: Payer: Self-pay | Admitting: Cardiovascular Disease

## 2022-06-19 ENCOUNTER — Encounter: Payer: Self-pay | Admitting: Cardiology

## 2022-06-19 ENCOUNTER — Ambulatory Visit: Payer: Medicare Other | Attending: Cardiology | Admitting: Cardiology

## 2022-06-19 NOTE — Progress Notes (Deleted)
Electrophysiology Office Follow up Visit Note:    Date:  06/19/2022   ID:  [name], DOB 08-16-1955, MRN [id number]  PCP:  Leanna Sato, MD  [facility] Cardiologist:  Julien Nordmann, MD  [facility] Electrophysiologist:  Lanier Prude, MD    Interval History:    [name] is a 66 y.o. male who presents for a follow up visit. They were last seen in clinic March 13, 2022 for atrial fibrillation and flutter.  He also had A-V dissociation/high degree AV block.  Thankfully he was asymptomatic.  It was felt like his risk of infection with permanent pacemaker implant was prohibitive given active lower extremity wounds/infection.  We planned to touch base today to reevaluate for any symptoms.  He takes Eliquis for stroke prophylaxis.       Past Medical History:  Diagnosis Date   CAD (coronary artery disease) 01/07/2014   Cellulitis 12/17/2015   Cellulitis and abscess of foot 01/17/2015   Cellulitis and abscess of foot excluding toe 01/17/2015   Cellulitis of left leg 12/17/2015   Chest pain 11/16/2013   Chronic combined systolic and diastolic CHF (congestive heart failure) ([name]) 01/17/2015   Chronic systolic CHF (congestive heart failure) ([name])    a. 10/2013 Echo: EF 35-40%.   Chronic, continuous use of opioids 08/26/2013   Circulatory system disorder 11/16/2013   Formatting of this note might be different from the original. Last Assessment & Plan:  Possibly secondary to embolic phenomenon versus erythrocytosis. He is currently taking aspirin and Plavix   CKD (chronic kidney disease), stage III ([name])    a. baseline creat 1.3 - 1.5.   Collagen vascular disease ([name])    Congestive dilated cardiomyopathy ([name]) 11/16/2013   COPD (chronic obstructive pulmonary disease) ([name])    Coronary artery disease, non-occlusive    a. 11/2013 MV: distal anteroseptal ischemia;  b. 12/2013 Cath: LM nl, LAD 76m, 90d, RI 70, LCX 45m, 40d, RCA 40p, 50d, RPLS 99, EF 40-45%, mild global HK-->Med  rx; c. 03/2016 MV: fixed apical defect (attenuation), no ischemia, EF 38%.   Coronary atherosclerosis 01/07/2014   Formatting of this note might be different from the original. Last Assessment & Plan:  Currently with no symptoms of angina. No further workup at this time. Continue current medication regimen.   Diabetes mellitus, type 2 ([name]) 11/16/2013   Diabetic infection of left foot ([name]) 02/03/2015   Endomyocardial disease ([name]) 11/16/2013   Formatting of this note might be different from the original. Last Assessment & Plan:  Recommended he continue his beta blocker, ACE inhibitor, nitrates. Blood pressure borderline low, asymptomatic   Essential hypertension 01/17/2015   Familial multiple lipoprotein-type hyperlipidemia 11/16/2013   Formatting of this note might be different from the original. Last Assessment & Plan:  Encouraged him to stay on his lovastatin. Goal LDL less than 70   Hereditary and idiopathic peripheral neuropathy 01/17/2015   HTN (hypertension) 01/17/2015   Hyperlipidemia    Ischemic cardiomyopathy    a. 10/2013 Echo: EF 35-40%, mild MR/TR;  b. 12/2013 Cath: moderate diffuse CAD-->Med Rx.   Ischemic toe    a. 10/2013 L fifth toe - felt to be either 2/2 embolic plaque vs erythrocytosis - seen by vascular surgery ([place]) ->conservative rx.   Left bundle branch block 11/16/2013   Morbid obesity ([name]) 11/16/2013   Nondependent opioid abuse, continuous ([name]) 08/26/2013   Other chronic pain 08/26/2013   Peripheral neuropathy    Secondary erythrocytosis    a.  followed by heme-onc with periodic phlebotomy.   Smoker 11/16/2013   SOB (shortness of breath) 11/16/2013   Tobacco abuse    a. 46 yrs, up to 2 ppd, changed to e-cigarette 05/2013.   Type 2 diabetes mellitus with diabetic neuropathy ([name]) 08/26/2013   Type 2 diabetes mellitus without complications ([name]) 11/16/2013   Formatting of this note might be different from the original. Last Assessment & Plan:  He is working with primary care for  improved diabetes control. Still not well controlled. Recommended he watch his carbohydrates, smaller meal portions    Past Surgical History:  Procedure Laterality Date   ACHILLES TENDON SURGERY Left 03/08/2016   Procedure: ACHILLES LENGTHENING/KIDNER;  Surgeon: Gwyneth Revels, DPM;  Location: [facility];  Service: Podiatry;  Laterality: Left;   CARDIOVERSION N/A 01/10/2022   Procedure: CARDIOVERSION;  Surgeon: Antonieta Iba, MD;  Location: [facility];  Service: Cardiovascular;  Laterality: N/A;   I & D EXTREMITY Left 01/18/2015   Procedure: IRRIGATION AND DEBRIDEMENT EXTREMITY;  Surgeon: Gwyneth Revels, DPM;  Location: [facility];  Service: Podiatry;  Laterality: Left;   ORIF TIBIA FRACTURE Left 2008   SKIN DEBRIDEMENT  03/08/2016   Procedure: DEBRIDEMENT SKIN FULL THICKNESS;  Surgeon: Gwyneth Revels, DPM;  Location: [facility];  Service: Podiatry;;   WOUND EXPLORATION Left 03/10/2015   Procedure: WOUND EXPLORATION/ SECONDARY CLOSURE OF WOUND,COMPLICATED;  Surgeon: Gwyneth Revels, DPM;  Location: [facility];  Service: Podiatry;  Laterality: Left;    Current Medications: No outpatient medications have been marked as taking for the 06/19/22 encounter (Appointment) with Lanier Prude, MD.     Allergies:   Metformin   Social History   Socioeconomic History   Marital status: Significant Other    Spouse name: Not on file   Number of children: Not on file   Years of education: Not on file   Highest education level: Not on file  Occupational History   Not on file  Tobacco Use   Smoking status: Every Day    Packs/day: 0.25    Years: 46.00    Total pack years: 11.50    Types: Cigarettes, E-cigarettes   Smokeless tobacco: Never   Tobacco comments:    Currently smoking E cigarettes.  Vaping Use   Vaping Use: Never used  Substance and Sexual Activity   Alcohol use: Yes    Comment: alcohol abuse in the past. / rare beer   Drug use: No    Comment: marijuana 5 joints per week in the past 2013    Sexual activity: Yes    Partners: Female  Other Topics Concern   Not on file  Social History Narrative   He lives in [place] with his dtr and son-in-law.  On disability.  Does not routinely exercise.  Eats 6 scoops of banana ice cream every night.   Social Determinants of Health   Financial Resource Strain: Not on file  Food Insecurity: Not on file  Transportation Needs: Not on file  Physical Activity: Not on file  Stress: Not on file  Social Connections: Not on file     Family History: The patient's family history includes Diabetes in his brother; Hyperlipidemia in his brother, brother, brother, and mother; Hypertension in his brother, brother, brother, maternal aunt, and mother.  ROS:   Please see the history of present illness.    All other systems reviewed and are negative.  EKGs/Labs/Other Studies Reviewed:    The following studies were reviewed today:    EKG:  The ekg ordered today demonstrates ***  Recent Labs: 12/06/2021: TSH 3.603 01/31/2022: ALT 13 02/01/2022: Hemoglobin 14.0; Magnesium 1.9; Platelets 91 02/02/2022: BUN 14; Potassium 3.9; Sodium 135 02/03/2022: Creatinine, Ser 1.31  Recent Lipid Panel No results found for: "CHOL", "TRIG", "HDL", "CHOLHDL", "VLDL", "LDLCALC", "LDLDIRECT"  Physical Exam:    VS:  There were no vitals taken for this visit.    Wt Readings from Last 3 Encounters:  03/13/22 (!) 317 lb (143.8 kg)  01/31/22 (!) 328 lb (148.8 kg)  01/10/22 (!) 328 lb (148.8 kg)     GEN: *** Well nourished, well developed in no acute distress HEENT: Normal NECK: No JVD; No carotid bruits LYMPHATICS: No lymphadenopathy CARDIAC: ***RRR, no murmurs, rubs, gallops RESPIRATORY:  Clear to auscultation without rales, wheezing or rhonchi  ABDOMEN: Soft, non-tender, non-distended MUSCULOSKELETAL:  No edema; No deformity  SKIN: Warm and dry NEUROLOGIC:  Alert and oriented x 3 PSYCHIATRIC:  Normal affect        ASSESSMENT:    1. HFrEF (heart  failure with reduced ejection fraction) ([name])   2. Persistent atrial fibrillation ([name])   3. Left bundle branch block   4. AV dissociation    PLAN:    In order of problems listed above:           Total time spent with patient today *** minutes. This includes reviewing records, evaluating the patient and coordinating care.   Medication Adjustments/Labs and Tests Ordered: Current medicines are reviewed at length with the patient today.  Concerns regarding medicines are outlined above.  No orders of the defined types were placed in this encounter.  No orders of the defined types were placed in this encounter.    Signed, Steffanie Dunn, MD, [facility], [facility] 06/19/2022 5:43 AM    Electrophysiology [facility]

## 2022-06-24 DIAGNOSIS — Z0279 Encounter for issue of other medical certificate: Secondary | ICD-10-CM

## 2022-07-05 DEATH — deceased

## 2022-09-10 ENCOUNTER — Telehealth: Payer: Self-pay | Admitting: Cardiovascular Disease

## 2022-09-10 NOTE — Telephone Encounter (Signed)
Patient wife [name] came in to drop off [employer] paperwork for deceased husband. Paid the $29 but needs to return to sign paperwork, left at front desk.

## 2022-09-30 NOTE — Telephone Encounter (Signed)
Wife signed signed paperwork & placed [employer] forms in box.

## 2022-10-02 NOTE — Telephone Encounter (Signed)
Spoke with patients spouse per release form. Inquired if he had neurologist which she denied. Patient last saw Dr. Quentin Ore so reviewed with providers nurse Carly RN for completion.

## 2022-10-09 NOTE — Telephone Encounter (Signed)
Forms completed and given to staff in front office for further processing.

## 2022-10-09 NOTE — Telephone Encounter (Signed)
Patient called to pickup paperwork, placed at front desk.
